# Patient Record
Sex: Male | Born: 1960 | Race: Black or African American | Hispanic: No | Marital: Single | Smoking: Never smoker
Health system: Southern US, Community
[De-identification: ages and names within clinical notes are randomized; demographics above are authoritative.]

## PROBLEM LIST (undated history)

## (undated) DIAGNOSIS — J45909 Unspecified asthma, uncomplicated: Secondary | ICD-10-CM

## (undated) DIAGNOSIS — N189 Chronic kidney disease, unspecified: Secondary | ICD-10-CM

## (undated) DIAGNOSIS — M1990 Unspecified osteoarthritis, unspecified site: Secondary | ICD-10-CM

## (undated) DIAGNOSIS — T8859XA Other complications of anesthesia, initial encounter: Secondary | ICD-10-CM

## (undated) DIAGNOSIS — I10 Essential (primary) hypertension: Secondary | ICD-10-CM

## (undated) HISTORY — PX: APPENDECTOMY: SHX54

## (undated) HISTORY — DX: Unspecified osteoarthritis, unspecified site: M19.90

---

## 2003-09-02 ENCOUNTER — Emergency Department (HOSPITAL_COMMUNITY): Admission: EM | Admit: 2003-09-02 | Discharge: 2003-09-02 | Payer: Self-pay | Admitting: Emergency Medicine

## 2004-05-10 ENCOUNTER — Ambulatory Visit: Payer: Self-pay | Admitting: Family Medicine

## 2004-06-10 ENCOUNTER — Ambulatory Visit: Payer: Self-pay | Admitting: Orthopedic Surgery

## 2005-05-05 ENCOUNTER — Ambulatory Visit: Payer: Self-pay | Admitting: Cardiology

## 2006-08-11 ENCOUNTER — Ambulatory Visit: Payer: Self-pay | Admitting: Cardiology

## 2009-05-07 ENCOUNTER — Encounter: Payer: Self-pay | Admitting: Family Medicine

## 2009-12-17 ENCOUNTER — Inpatient Hospital Stay (HOSPITAL_COMMUNITY): Admission: EM | Admit: 2009-12-17 | Discharge: 2009-12-20 | Payer: Self-pay | Admitting: Emergency Medicine

## 2010-09-19 LAB — DIFFERENTIAL
Basophils Absolute: 0 10*3/uL (ref 0.0–0.1)
Basophils Relative: 1 % (ref 0–1)
Basophils Relative: 1 % (ref 0–1)
Eosinophils Absolute: 0.1 10*3/uL (ref 0.0–0.7)
Eosinophils Absolute: 0.3 10*3/uL (ref 0.0–0.7)
Eosinophils Relative: 1 % (ref 0–5)
Eosinophils Relative: 4 % (ref 0–5)
Lymphs Abs: 2.5 10*3/uL (ref 0.7–4.0)
Monocytes Absolute: 0.6 10*3/uL (ref 0.1–1.0)
Monocytes Absolute: 0.9 10*3/uL (ref 0.1–1.0)
Monocytes Relative: 7 % (ref 3–12)
Monocytes Relative: 8 % (ref 3–12)
Monocytes Relative: 8 % (ref 3–12)
Neutro Abs: 8.1 10*3/uL — ABNORMAL HIGH (ref 1.7–7.7)
Neutrophils Relative %: 58 % (ref 43–77)

## 2010-09-19 LAB — BASIC METABOLIC PANEL
Calcium: 9.2 mg/dL (ref 8.4–10.5)
Creatinine, Ser: 1.16 mg/dL (ref 0.4–1.5)
GFR calc non Af Amer: >60 mL/min (ref >60)
Potassium: 4 mEq/L (ref 3.5–5.1)
Sodium: 136 mEq/L (ref 135–145)

## 2010-09-19 LAB — CARDIAC PANEL(CRET KIN+CKTOT+MB+TROPI)
CK, MB: 1.8 ng/mL (ref 0.3–4.0)
CK, MB: 2.4 ng/mL (ref 0.3–4.0)
Relative Index: 0.4 (ref 0.0–2.5)
Total CK: 402 U/L — ABNORMAL HIGH (ref 7–232)
Total CK: 584 U/L — ABNORMAL HIGH (ref 7–232)
Troponin I: 0.01 ng/mL (ref 0.00–0.06)
Troponin I: 0.01 ng/mL (ref 0.00–0.06)

## 2010-09-19 LAB — RAPID URINE DRUG SCREEN, HOSP PERFORMED
Amphetamines: NOT DETECTED
Barbiturates: NOT DETECTED
Tetrahydrocannabinol: POSITIVE — AB

## 2010-09-19 LAB — CBC
HCT: 43.6 % (ref 39.0–52.0)
Hemoglobin: 12.9 g/dL — ABNORMAL LOW (ref 13.0–17.0)
Hemoglobin: 14.6 g/dL (ref 13.0–17.0)
MCHC: 33.6 g/dL (ref 30.0–36.0)
MCHC: 33.7 g/dL (ref 30.0–36.0)
MCV: 94.2 fL (ref 78.0–100.0)
MCV: 94.8 fL (ref 78.0–100.0)
Platelets: 419 10*3/uL — ABNORMAL HIGH (ref 150–400)
RBC: 4.02 MIL/uL — ABNORMAL LOW (ref 4.22–5.81)
RBC: 4.22 MIL/uL (ref 4.22–5.81)
RDW: 13.3 % (ref 11.5–15.5)
RDW: 13.5 % (ref 11.5–15.5)
RDW: 13.8 % (ref 11.5–15.5)
WBC: 11.3 10*3/uL — ABNORMAL HIGH (ref 4.0–10.5)

## 2010-09-19 LAB — COMPREHENSIVE METABOLIC PANEL
ALT: 28 U/L (ref 0–53)
AST: 26 U/L (ref 0–37)
AST: 32 U/L (ref 0–37)
Albumin: 4.2 g/dL (ref 3.5–5.2)
Alkaline Phosphatase: 72 U/L (ref 39–117)
Alkaline Phosphatase: 82 U/L (ref 39–117)
BUN: 8 mg/dL (ref 6–23)
BUN: 9 mg/dL (ref 6–23)
CO2: 26 mEq/L (ref 19–32)
CO2: 28 mEq/L (ref 19–32)
Calcium: 9 mg/dL (ref 8.4–10.5)
Calcium: 9.6 mg/dL (ref 8.4–10.5)
Calcium: 9.6 mg/dL (ref 8.4–10.5)
Chloride: 102 mEq/L (ref 96–112)
Chloride: 103 mEq/L (ref 96–112)
Creatinine, Ser: 1.28 mg/dL (ref 0.4–1.5)
Creatinine, Ser: 1.32 mg/dL (ref 0.4–1.5)
GFR calc non Af Amer: >60 mL/min (ref >60)
Glucose, Bld: 110 mg/dL — ABNORMAL HIGH (ref 70–99)
Potassium: 3.7 mEq/L (ref 3.5–5.1)
Sodium: 138 mEq/L (ref 135–145)
Total Bilirubin: 0.5 mg/dL (ref 0.3–1.2)
Total Bilirubin: 0.6 mg/dL (ref 0.3–1.2)
Total Bilirubin: 0.7 mg/dL (ref 0.3–1.2)
Total Protein: 6.9 g/dL (ref 6.0–8.3)
Total Protein: 7.5 g/dL (ref 6.0–8.3)
Total Protein: 8.4 g/dL — ABNORMAL HIGH (ref 6.0–8.3)

## 2010-09-19 LAB — URINALYSIS, ROUTINE W REFLEX MICROSCOPIC
Hgb urine dipstick: NEGATIVE
Ketones, ur: NEGATIVE mg/dL
Nitrite: NEGATIVE
pH: 7 (ref 5.0–8.0)

## 2010-09-19 LAB — BRAIN NATRIURETIC PEPTIDE: Pro B Natriuretic peptide (BNP): <30 pg/mL (ref 0.0–100.0)

## 2010-09-19 LAB — LIPID PANEL
HDL: 33 mg/dL — ABNORMAL LOW (ref >39)
LDL Cholesterol: 115 mg/dL — ABNORMAL HIGH (ref 0–99)
Total CHOL/HDL Ratio: 5.6 RATIO
VLDL: 38 mg/dL (ref 0–40)

## 2010-09-19 LAB — LIPASE, BLOOD
Lipase: 159 U/L — ABNORMAL HIGH (ref 11–59)
Lipase: 198 U/L — ABNORMAL HIGH (ref 11–59)

## 2011-10-02 ENCOUNTER — Encounter (HOSPITAL_COMMUNITY): Payer: Self-pay | Admitting: Emergency Medicine

## 2011-10-02 ENCOUNTER — Emergency Department (HOSPITAL_COMMUNITY)
Admission: EM | Admit: 2011-10-02 | Discharge: 2011-10-02 | Disposition: A | Discharge status: Home / Self Care | Payer: Self-pay | Attending: Emergency Medicine | Admitting: Emergency Medicine

## 2011-10-02 ENCOUNTER — Emergency Department (HOSPITAL_COMMUNITY): Payer: Self-pay

## 2011-10-02 DIAGNOSIS — R220 Localized swelling, mass and lump, head: Secondary | ICD-10-CM | POA: Insufficient documentation

## 2011-10-02 DIAGNOSIS — Z79899 Other long term (current) drug therapy: Secondary | ICD-10-CM | POA: Insufficient documentation

## 2011-10-02 DIAGNOSIS — T7840XA Allergy, unspecified, initial encounter: Secondary | ICD-10-CM | POA: Insufficient documentation

## 2011-10-02 DIAGNOSIS — H5789 Other specified disorders of eye and adnexa: Secondary | ICD-10-CM | POA: Insufficient documentation

## 2011-10-02 DIAGNOSIS — L509 Urticaria, unspecified: Secondary | ICD-10-CM | POA: Insufficient documentation

## 2011-10-02 DIAGNOSIS — R21 Rash and other nonspecific skin eruption: Secondary | ICD-10-CM | POA: Insufficient documentation

## 2011-10-02 DIAGNOSIS — I10 Essential (primary) hypertension: Secondary | ICD-10-CM | POA: Insufficient documentation

## 2011-10-02 HISTORY — DX: Essential (primary) hypertension: I10

## 2011-10-02 LAB — DIFFERENTIAL
Basophils Relative: 1 % (ref 0–1)
Lymphocytes Relative: 31 % (ref 12–46)
Lymphs Abs: 3 10*3/uL (ref 0.7–4.0)
Monocytes Relative: 8 % (ref 3–12)
Neutro Abs: 4.9 10*3/uL (ref 1.7–7.7)
Neutrophils Relative %: 50 % (ref 43–77)

## 2011-10-02 LAB — BASIC METABOLIC PANEL
BUN: 10 mg/dL (ref 6–23)
CO2: 29 mEq/L (ref 19–32)
Chloride: 101 mEq/L (ref 96–112)
GFR calc Af Amer: 75 mL/min — ABNORMAL LOW (ref >90)
Glucose, Bld: 109 mg/dL — ABNORMAL HIGH (ref 70–99)
Potassium: 2.9 mEq/L — ABNORMAL LOW (ref 3.5–5.1)

## 2011-10-02 MED ORDER — SODIUM CHLORIDE 0.9 % IV SOLN: INTRAVENOUS | Status: DC

## 2011-10-02 MED ORDER — SODIUM CHLORIDE 0.9 % IV BOLUS (SEPSIS)
250.0000 mL | Freq: Once | INTRAVENOUS | Status: AC
  Administered 2011-10-02: 250 mL via INTRAVENOUS

## 2011-10-02 MED ORDER — METHYLPREDNISOLONE SODIUM SUCC 125 MG IJ SOLR
INTRAMUSCULAR | Status: AC
  Filled 2011-10-02: qty 2

## 2011-10-02 MED ORDER — FAMOTIDINE IN NACL 20-0.9 MG/50ML-% IV SOLN
INTRAVENOUS | Status: AC
  Filled 2011-10-02: qty 50

## 2011-10-02 MED ORDER — PREDNISONE 10 MG PO TABS: 40.0000 mg | ORAL_TABLET | Freq: Every day | ORAL | Status: DC

## 2011-10-02 MED ORDER — DIPHENHYDRAMINE HCL 50 MG/ML IJ SOLN
25.0000 mg | Freq: Once | INTRAMUSCULAR | Status: AC
  Administered 2011-10-02: 25 mg via INTRAVENOUS

## 2011-10-02 MED ORDER — DIPHENHYDRAMINE HCL 25 MG PO TABS: 25.0000 mg | ORAL_TABLET | Freq: Four times a day (QID) | ORAL | Status: DC

## 2011-10-02 MED ORDER — FAMOTIDINE 20 MG PO TABS: 20.0000 mg | ORAL_TABLET | Freq: Two times a day (BID) | ORAL | Status: DC

## 2011-10-02 MED ORDER — IOHEXOL 300 MG/ML  SOLN
80.0000 mL | Freq: Once | INTRAMUSCULAR | Status: AC | PRN
  Administered 2011-10-02: 80 mL via INTRAVENOUS

## 2011-10-02 MED ORDER — SODIUM CHLORIDE 0.9 % IV SOLN
Freq: Once | INTRAVENOUS | Status: AC
  Administered 2011-10-02: 07:00:00 via INTRAVENOUS

## 2011-10-02 MED ORDER — EPINEPHRINE HCL 1 MG/ML IJ SOLN
INTRAMUSCULAR | Status: AC
  Filled 2011-10-02: qty 1

## 2011-10-02 MED ORDER — AMOXICILLIN-POT CLAVULANATE 875-125 MG PO TABS: 1.0000 | ORAL_TABLET | Freq: Two times a day (BID) | ORAL | Status: AC

## 2011-10-02 MED ORDER — DIPHENHYDRAMINE HCL 50 MG/ML IJ SOLN
INTRAMUSCULAR | Status: AC
  Filled 2011-10-02: qty 1

## 2011-10-02 MED ORDER — FAMOTIDINE IN NACL 20-0.9 MG/50ML-% IV SOLN
20.0000 mg | Freq: Once | INTRAVENOUS | Status: AC
  Administered 2011-10-02: 20 mg via INTRAVENOUS

## 2011-10-02 MED ORDER — METHYLPREDNISOLONE SODIUM SUCC 125 MG IJ SOLR
125.0000 mg | Freq: Once | INTRAMUSCULAR | Status: AC
  Administered 2011-10-02: 125 mg via INTRAVENOUS

## 2011-10-02 MED ORDER — ONDANSETRON HCL 4 MG/2ML IJ SOLN
4.0000 mg | Freq: Once | INTRAMUSCULAR | Status: AC
  Administered 2011-10-02: 4 mg via INTRAVENOUS
  Filled 2011-10-02: qty 2

## 2011-10-02 MED ORDER — EPINEPHRINE HCL 0.1 MG/ML IJ SOLN
0.3000 mg | Freq: Once | INTRAMUSCULAR | Status: AC
  Administered 2011-10-02: 0.3 mg via INTRAVENOUS
  Filled 2011-10-02: qty 10

## 2011-10-02 NOTE — ED Provider Notes (Signed)
History     CSN: [id number]  Arrival date & time 10/02/11  [id number]   First MD Initiated Contact with Patient 10/02/11 [phone]      Chief Complaint  Patient presents with  . Allergic Reaction    (Consider location/radiation/quality/duration/timing/severity/associated sxs/prior treatment) Patient is a 51 y.o. male presenting with allergic reaction. The history is provided by the patient.  Allergic Reaction  He woke up about 15 minutes ago and noticed that his face was swollen and he had a rash on his chest. He denies itching or difficulty breathing but he does feel like his throat is starting to swell. There's been no fever, chills, sweats. There's been no nausea or vomiting or diarrhea. He he is taking verapamil for blood pressure but is on no other medications. He denies any new exposures. He has never had reactions like this before. Symptoms are moderate to severe. Nothing makes it better nothing makes it worse.  Past Medical History  Diagnosis Date  . Hypertension     History reviewed. No pertinent past surgical history.  History reviewed. No pertinent family history.  History  Substance Use Topics  . Smoking status: Not on file  . Smokeless tobacco: Not on file  . Alcohol Use:       Review of Systems  All other systems reviewed and are negative.    Allergies  Shellfish allergy  Home Medications   Current Outpatient Rx  Name Route Sig Dispense Refill  . VERAPAMIL HCL 40 MG PO TABS Oral Take by mouth 3 (three) times daily.      Ht 6\' 1"  (1.854 m)  Wt 225 lb (102.059 kg)  BMI 29.69 kg/m2  Physical Exam  Nursing note and vitals reviewed.  51 year old male who is resting comfortably and in no acute distress. Vital signs are normal. Head is normocephalic and atraumatic. PERRLA, EOMI. Moderate  swelling is noted of the eyelids and upper lip. There is no swelling of the tongue, sublingual tissue, or pharynx. He has no difficulty tolerating his secretions and his  phonation is normal. Some tense swelling in the submandibular area. Neck is nontender supple without stridor. Lungs are clear without rales, wheezes, or rhonchi. Heart has regular rate rhythm without murmur. Abdomen is soft, flat, nontender without masses or hepatosplenomegaly. Extremities have no cyanosis or edema full range of motion is present. Skin: Erythematous 2 urticarial rash is present over the upper trunk. Neurologic: Mental status is normal, cranial nerves are intact, there no focal motor or sensory deficits.  ED Course  Procedures (including critical care time)  Results for orders placed during the hospital encounter of 10/02/11  CBC      Component Value Range   WBC 9.6  4.0 - 10.5 (K/uL)   RBC 4.53  4.22 - 5.81 (MIL/uL)   Hemoglobin 14.0  13.0 - 17.0 (g/dL)   HCT 41.0  39.0 - 52.0 (%)   MCV 90.5  78.0 - 100.0 (fL)   MCH 30.9  26.0 - 34.0 (pg)   MCHC 34.1  30.0 - 36.0 (g/dL)   RDW 12.9  11.5 - 15.5 (%)   Platelets 467 (*) 150 - 400 (K/uL)  DIFFERENTIAL      Component Value Range   Neutrophils Relative 50  43 - 77 (%)   Neutro Abs 4.9  1.7 - 7.7 (K/uL)   Lymphocytes Relative 31  12 - 46 (%)   Lymphs Abs 3.0  0.7 - 4.0 (K/uL)   Monocytes Relative 8  3 - 12 (%)  Monocytes Absolute 0.8  0.1 - 1.0 (K/uL)   Eosinophils Relative 9 (*) 0 - 5 (%)   Eosinophils Absolute 0.9 (*) 0.0 - 0.7 (K/uL)   Basophils Relative 1  0 - 1 (%)   Basophils Absolute 0.1  0.0 - 0.1 (K/uL)  BASIC METABOLIC PANEL      Component Value Range   Sodium 141  135 - 145 (mEq/L)   Potassium 2.9 (*) 3.5 - 5.1 (mEq/L)   Chloride 101  96 - 112 (mEq/L)   CO2 29  19 - 32 (mEq/L)   Glucose, Bld 109 (*) 70 - 99 (mg/dL)   BUN 10  6 - 23 (mg/dL)   Creatinine, Ser 1.28  0.50 - 1.35 (mg/dL)   Calcium 10.0  8.4 - 10.5 (mg/dL)   GFR calc non Af Amer 65 (*) >90 (mL/min)   GFR calc Af Amer 75 (*) >90 (mL/min)   After Benadryl, Pepcid, and Solu-Medrol he did not see any improvement and stated he was a little worse  and that he was itching more. He will be given additional Benadryl and will be given epinephrine. On reexam, swelling looked to be about the same and he continued to have normal voice and no difficulty with secretions. Care will be turned over to Dr. Bobby Rumpf.  1. Allergic reaction     CRITICAL CARE Performed by: [id number]   Total critical care time: 45 minutes  Critical care time was exclusive of separately billable procedures and treating other patients.  Critical care was necessary to treat or prevent imminent or life-threatening deterioration.  Critical care was time spent personally by me on the following activities: development of treatment plan with patient and/or surrogate as well as nursing, discussions with consultants, evaluation of patient's response to treatment, examination of patient, obtaining history from patient or surrogate, ordering and performing treatments and interventions, ordering and review of laboratory studies, ordering and review of radiographic studies, pulse oximetry and re-evaluation of patient's condition.   MDM  Acute allergic reaction although it is unclear at this point what he might be reacting to. He is given Benadryl, Pepcid, & Medrol and will be observed.        Delora Fuel, MD [id number]

## 2011-10-02 NOTE — ED Provider Notes (Addendum)
Patient turned over to me by Dr. Roxanne Mins. Felt to have an allergic reaction with facial swelling mostly underneath the chin in the submental area patient said his tongue was itchy but not really swollen treated with epinephrine Solu-Medrol Pepcid Benadryl now been a couple hours and patient states that the tongue itching is gone away but the submental swelling is still present really denies any pain or tooth pain ever when you palpate the submental areas. Firm and somewhat tender. Will proceed with the CT scan soft tissue neck with contrast to rule out an abscess or thyo glossal duct cyst. Not convinced that this is a true allergic reaction we'll see what CT scan results show.   Results for orders placed during the hospital encounter of 10/02/11  CBC      Component Value Range   WBC 9.6  4.0 - 10.5 (K/uL)   RBC 4.53  4.22 - 5.81 (MIL/uL)   Hemoglobin 14.0  13.0 - 17.0 (g/dL)   HCT 41.0  39.0 - 52.0 (%)   MCV 90.5  78.0 - 100.0 (fL)   MCH 30.9  26.0 - 34.0 (pg)   MCHC 34.1  30.0 - 36.0 (g/dL)   RDW 12.9  11.5 - 15.5 (%)   Platelets 467 (*) 150 - 400 (K/uL)  DIFFERENTIAL      Component Value Range   Neutrophils Relative 50  43 - 77 (%)   Neutro Abs 4.9  1.7 - 7.7 (K/uL)   Lymphocytes Relative 31  12 - 46 (%)   Lymphs Abs 3.0  0.7 - 4.0 (K/uL)   Monocytes Relative 8  3 - 12 (%)   Monocytes Absolute 0.8  0.1 - 1.0 (K/uL)   Eosinophils Relative 9 (*) 0 - 5 (%)   Eosinophils Absolute 0.9 (*) 0.0 - 0.7 (K/uL)   Basophils Relative 1  0 - 1 (%)   Basophils Absolute 0.1  0.0 - 0.1 (K/uL)  BASIC METABOLIC PANEL      Component Value Range   Sodium 141  135 - 145 (mEq/L)   Potassium 2.9 (*) 3.5 - 5.1 (mEq/L)   Chloride 101  96 - 112 (mEq/L)   CO2 29  19 - 32 (mEq/L)   Glucose, Bld 109 (*) 70 - 99 (mg/dL)   BUN 10  6 - 23 (mg/dL)   Creatinine, Ser 1.28  0.50 - 1.35 (mg/dL)   Calcium 10.0  8.4 - 10.5 (mg/dL)   GFR calc non Af Amer 65 (*) >90 (mL/min)   GFR calc Af Amer 75 (*) >90 (mL/min)   Ct  Soft Tissue Neck W Contrast  10/02/2011  *RADIOLOGY REPORT*  Clinical Data: 51 year old male low with face and neck swelling. Difficulty breathing.  CT NECK WITH CONTRAST  Technique:  Multidetector CT imaging of the neck was performed with intravenous contrast.  Contrast: 54mL OMNIPAQUE IOHEXOL 300 MG/ML IJ SOLN following 1 hour protocol steroid premedication for  stated IV contrast allergy.  No allergic reactions noted during the patient's time and radiology.  Comparison: None.  Findings: Generalized subcutaneous fat stranding, thickening of the platysma, and deep neck fat stranding bilaterally from the level of the mandible inferiorly.  Enlarged level IA lymph nodes up to 13 mm in short axis appear to be reactive.  Right greater than left level II nodes measure up to 13 mm.  Both submandibular glands are surrounded by the inflammatory changes, but show no hyperenhancement or significant heterogeneity.  The inflammation extends to both carotid spaces, but does not  involve the parapharyngeal or sublingual spaces.  The retropharyngeal space also does not appear significantly affected.  Symmetric appearing hypertrophy of the tonsillar pillars.  There is mild inflammation affecting the vallecula and hypopharynx, but the epiglottis is not thickened.  There is motion artifact at the larynx which appears within normal limits.  The inflammation does not extend into the mediastinum.  Negative thyroid.  Major vascular structures including both internal jugular veins and the visualized innominate vein and superior SVC are patent.  Visualized orbit soft tissues are within normal limits.  Negative visualized brain parenchyma.  Mild bubbly opacity in the left maxillary sinus.  Mild right maxillary sinus mucosal thickening. Other Visualized paranasal sinuses and mastoids are clear.  No acute dental findings identified. No acute osseous abnormality identified.  Chronic cervical disc and endplate degeneration.  IMPRESSION: 1.   Generalized inflammatory change in the anterior neck extending from the level of the mandible to the sternoclavicular level.  Fat stranding and reactive type level I and level II lymphadenopathy with no abscess or apparent infectious epicenter. 2.  There is mild inflammatory involvement of the hypopharynx, but the epiglottis and glottis appear relatively spared.  No significant retropharyngeal involvement.  No sublingual space or parapharyngeal involvement. 3.  No mediastinal involvement.  Visualized major vascular structures are patent.  4.  The patient received IV contrast following 1 hour steroid premedication regimen with no apparent reaction.  Original Report Authenticated By: Randall An, M.D.   CT scan results noted still not clear if soft tissue swelling is allergic related with the lymph node involvements more suggestive of non-allergic reaction but no distinct evidence of abscess. Patient is going to require followup with ear nose and throat will provide referral will treat with prednisone Benadryl and Pepcid in the short run and add an antibiotic. Close followup with ENT will be important. Patient will be given instructions to return for any new or worse symptoms.  Patient tolerated the CT scan with dye fine no additional allergic reaction. Patient's airway is patent according to the scan no immediate concerns there.   Mervin Kung, MD 10/02/11 [place], MD 10/02/11 [phone]

## 2011-10-02 NOTE — ED Notes (Signed)
Patient states he woke up with facial swelling, rash on upper torso and back, and feels like his throat is swelling up. Denies eating anything he's allergic to.

## 2011-10-02 NOTE — Discharge Instructions (Signed)
Allergic Reaction Allergic reactions can be caused by anything your body is sensitive to. Your body may be sensitive to food, medicines, molds, pollens, cockroaches, dust mites, pets, insect stings, and other things around you. An allergic reaction may cause puffiness (swelling), itching, sneezing, coughing, or problems breathing.  Allergies cannot be cured, but they can be controlled with medicine. Some allergies happen only at certain times of the year. Try to stay away from what causes your reaction if possible. Sometimes, it is hard to tell what causes your reaction. HOME CARE If you have a rash or red patches (hives) on your skin:  Take medicines as told by your doctor.   Do not drive or drink alcohol after taking medicines. They can make you sleepy.   Put cold cloths on your skin. Take baths in cool water. This will help your itching. Do not take hot baths or showers. Heat will make the itching worse.   If your allergies get worse, your doctor might give you other medicines. Talk to your doctor if problems continue.  GET HELP RIGHT AWAY IF:   You have trouble breathing.   You have a tight feeling in your chest or throat.   Your mouth gets puffy (swollen).   You have red, itchy patches on your skin (hives) that get worse.   You have itching all over your body.  MAKE SURE YOU:   Understand these instructions.   Will watch your condition.   Will get help right away if you are not doing well or get worse.  Document Released: 06/08/2009 Document Revised: 06/09/2011 Document Reviewed: 06/08/2009 [facility] Patient Information 2012 [place].  Important to followup with ear nose and throat CT scan shows a significant amount of swelling not clear if it's an allergic reaction also enlarged lymph nodes if do not improve we'll require biopsy. Take prednisone Benadryl and Pepcid as directed. Also take Augmentin the antibiotic as directed to see if it will improve and a developing  infection. Important to return for any new or worse symptoms. Very important to followup with ear nose and throat.

## 2011-11-22 DIAGNOSIS — IMO0002 Reserved for concepts with insufficient information to code with codable children: Secondary | ICD-10-CM | POA: Insufficient documentation

## 2011-11-22 DIAGNOSIS — R079 Chest pain, unspecified: Secondary | ICD-10-CM | POA: Insufficient documentation

## 2011-11-22 DIAGNOSIS — R0602 Shortness of breath: Secondary | ICD-10-CM | POA: Insufficient documentation

## 2011-11-22 DIAGNOSIS — Z79899 Other long term (current) drug therapy: Secondary | ICD-10-CM | POA: Insufficient documentation

## 2011-11-22 DIAGNOSIS — I10 Essential (primary) hypertension: Secondary | ICD-10-CM | POA: Insufficient documentation

## 2011-11-22 DIAGNOSIS — E876 Hypokalemia: Secondary | ICD-10-CM | POA: Insufficient documentation

## 2011-11-22 DIAGNOSIS — F17200 Nicotine dependence, unspecified, uncomplicated: Secondary | ICD-10-CM | POA: Insufficient documentation

## 2011-11-22 NOTE — ED Notes (Signed)
Patient c/o left sided chest pain that radiates into left side of back.

## 2011-11-23 ENCOUNTER — Emergency Department (HOSPITAL_COMMUNITY)
Admission: EM | Admit: 2011-11-23 | Discharge: 2011-11-23 | Disposition: A | Discharge status: Home / Self Care | Payer: Self-pay | Attending: Emergency Medicine | Admitting: Emergency Medicine

## 2011-11-23 ENCOUNTER — Emergency Department (HOSPITAL_COMMUNITY): Payer: Self-pay

## 2011-11-23 ENCOUNTER — Encounter (HOSPITAL_COMMUNITY): Payer: Self-pay | Admitting: Emergency Medicine

## 2011-11-23 DIAGNOSIS — E876 Hypokalemia: Secondary | ICD-10-CM

## 2011-11-23 DIAGNOSIS — R200 Anesthesia of skin: Secondary | ICD-10-CM

## 2011-11-23 DIAGNOSIS — R202 Paresthesia of skin: Secondary | ICD-10-CM

## 2011-11-23 LAB — BASIC METABOLIC PANEL
BUN: 21 mg/dL (ref 6–23)
Chloride: 103 mEq/L (ref 96–112)
GFR calc non Af Amer: 51 mL/min — ABNORMAL LOW (ref >90)
Glucose, Bld: 94 mg/dL (ref 70–99)
Potassium: 3.1 mEq/L — ABNORMAL LOW (ref 3.5–5.1)

## 2011-11-23 LAB — CBC
HCT: 37 % — ABNORMAL LOW (ref 39.0–52.0)
Hemoglobin: 12.7 g/dL — ABNORMAL LOW (ref 13.0–17.0)
MCHC: 34.3 g/dL (ref 30.0–36.0)

## 2011-11-23 LAB — URINALYSIS, ROUTINE W REFLEX MICROSCOPIC
Bilirubin Urine: NEGATIVE
Glucose, UA: NEGATIVE mg/dL
Ketones, ur: 15 mg/dL — AB
Protein, ur: NEGATIVE mg/dL
pH: 6 (ref 5.0–8.0)

## 2011-11-23 MED ORDER — ONDANSETRON HCL 4 MG/2ML IJ SOLN
4.0000 mg | Freq: Once | INTRAMUSCULAR | Status: AC
  Administered 2011-11-23: 4 mg via INTRAVENOUS
  Filled 2011-11-23: qty 2

## 2011-11-23 MED ORDER — MORPHINE SULFATE 4 MG/ML IJ SOLN
2.0000 mg | Freq: Once | INTRAMUSCULAR | Status: AC
  Administered 2011-11-23: 2 mg via INTRAVENOUS
  Filled 2011-11-23: qty 1

## 2011-11-23 MED ORDER — SODIUM CHLORIDE 0.9 % IV SOLN
Freq: Once | INTRAVENOUS | Status: AC
  Administered 2011-11-23: 01:00:00 via INTRAVENOUS

## 2011-11-23 MED ORDER — POTASSIUM CHLORIDE CRYS ER 20 MEQ PO TBCR
60.0000 meq | EXTENDED_RELEASE_TABLET | Freq: Once | ORAL | Status: AC
  Administered 2011-11-23: 60 meq via ORAL
  Filled 2011-11-23: qty 3

## 2011-11-23 NOTE — ED Notes (Signed)
Pt reports mild relief from pain and nausea.  Pt has tolerated 3 glasses of water and 2 cans of gingerale with no vomiting.

## 2011-11-23 NOTE — ED Provider Notes (Cosign Needed)
History     CSN: [id number]  Arrival date & time 11/22/11  2346   First MD Initiated Contact with Patient 11/23/11 0011      Chief Complaint  Patient presents with  . Chest Pain    (Consider location/radiation/quality/duration/timing/severity/associated sxs/prior treatment) HPI Ethan Schmidt is a 51 y.o. male who presents to the Emergency Department complaining of multiple complaints including tingling and numbness to the fingers on his right hand, chest discomfort to the left chest that radiates around to his back, incidental shortness of breath, and a feeling of fatigue. On symptoms began at 8:00 this evening. He is taking no medications. There is nothing that seems to make any of his symptoms better, nothing makes them worse.  No PCP  Past Medical History  Diagnosis Date  . Hypertension     History reviewed. No pertinent past surgical history.  No family history on file.  History  Substance Use Topics  . Smoking status: Current Everyday Smoker  . Smokeless tobacco: Not on file  . Alcohol Use: Yes     occ      Review of Systems  Constitutional: Negative for fever.       10 Systems reviewed and are negative for acute change except as noted in the HPI.  HENT: Negative for congestion.   Eyes: Negative for discharge and redness.  Respiratory: Positive for shortness of breath. Negative for cough.   Cardiovascular: Positive for chest pain.  Gastrointestinal: Negative for vomiting and abdominal pain.  Musculoskeletal: Negative for back pain.  Skin: Negative for rash.  Neurological: Positive for numbness. Negative for syncope and headaches.  Psychiatric/Behavioral:       No behavior change.    Allergies  Omnipaque and Shellfish allergy  Home Medications   Current Outpatient Rx  Name Route Sig Dispense Refill  . DIPHENHYDRAMINE HCL 25 MG PO TABS Oral Take 1 tablet (25 mg total) by mouth every 6 (six) hours. 20 tablet 0  . FAMOTIDINE 20 MG PO TABS Oral Take 1  tablet (20 mg total) by mouth 2 (two) times daily. 20 tablet 0  . PREDNISONE 10 MG PO TABS Oral Take 4 tablets (40 mg total) by mouth daily. 10 tablet 0  . VERAPAMIL HCL 40 MG PO TABS Oral Take by mouth 2 (two) times daily.       Pulse 74  Temp(Src) 97.9 F (36.6 C) (Oral)  Resp 20  Ht 6\' 1"  (1.854 m)  Wt 225 lb (102.059 kg)  BMI 29.69 kg/m2  SpO2 94%  Physical Exam  Nursing note and vitals reviewed. Constitutional: He is oriented to person, place, and time.       Awake, alert, nontoxic appearance.  HENT:  Head: Atraumatic.  Eyes: Right eye exhibits no discharge. Left eye exhibits no discharge.  Neck: Neck supple.  Cardiovascular: Normal rate, normal heart sounds and intact distal pulses.   Pulmonary/Chest: Effort normal. He has no wheezes. He has no rales. He exhibits no tenderness.  Abdominal: Soft. There is no tenderness. There is no rebound.  Musculoskeletal: He exhibits no tenderness.       Baseline ROM, no obvious new focal weakness.No spinal tenderness to percussion. Mild left sided lumbar paraspinal muscle tenderness to palpation.   Neurological: He is alert and oriented to person, place, and time. He displays normal reflexes. No cranial nerve deficit. Coordination normal.       Mental status and motor strength appears baseline for patient and situation.  Skin: Skin is warm and dry.  No rash noted.  Psychiatric: He has a normal mood and affect.    ED Course  Procedures (including critical care time) Results for orders placed during the hospital encounter of 11/23/11  CBC      Component Value Range   WBC 7.4  4.0 - 10.5 (K/uL)   RBC 4.03 (*) 4.22 - 5.81 (MIL/uL)   Hemoglobin 12.7 (*) 13.0 - 17.0 (g/dL)   HCT 37.0 (*) 39.0 - 52.0 (%)   MCV 91.8  78.0 - 100.0 (fL)   MCH 31.5  26.0 - 34.0 (pg)   MCHC 34.3  30.0 - 36.0 (g/dL)   RDW 13.0  11.5 - 15.5 (%)   Platelets 396  150 - 400 (K/uL)  BASIC METABOLIC PANEL      Component Value Range   Sodium 141  135 - 145  (mEq/L)   Potassium 3.1 (*) 3.5 - 5.1 (mEq/L)   Chloride 103  96 - 112 (mEq/L)   CO2 26  19 - 32 (mEq/L)   Glucose, Bld 94  70 - 99 (mg/dL)   BUN 21  6 - 23 (mg/dL)   Creatinine, Ser 1.56 (*) 0.50 - 1.35 (mg/dL)   Calcium 9.7  8.4 - 10.5 (mg/dL)   GFR calc non Af Amer 51 (*) >90 (mL/min)   GFR calc Af Amer 59 (*) >90 (mL/min)  URINALYSIS, ROUTINE W REFLEX MICROSCOPIC      Component Value Range   Color, Urine YELLOW  YELLOW    APPearance CLEAR  CLEAR    Specific Gravity, Urine 1.025  1.005 - 1.030    pH 6.0  5.0 - 8.0    Glucose, UA NEGATIVE  NEGATIVE (mg/dL)   Hgb urine dipstick NEGATIVE  NEGATIVE    Bilirubin Urine NEGATIVE  NEGATIVE    Ketones, ur 15 (*) NEGATIVE (mg/dL)   Protein, ur NEGATIVE  NEGATIVE (mg/dL)   Urobilinogen, UA 0.2  0.0 - 1.0 (mg/dL)   Nitrite NEGATIVE  NEGATIVE    Leukocytes, UA NEGATIVE  NEGATIVE   POCT I-STAT TROPONIN I      Component Value Range   Troponin i, poc 0.00  0.00 - 0.08 (ng/mL)   Comment 3              Date: 11/23/2011  0004  Rate: 71  Rhythm: normal sinus rhythm  QRS Axis: normal  Intervals: normal  ST/T Wave abnormalities: normal  Conduction Disutrbances:none  Narrative Interpretation:   Old EKG Reviewed: none available Chest Portable 1 View  11/23/2011  *RADIOLOGY REPORT*  Clinical Data: Chest pain  PORTABLE CHEST - 1 VIEW  Comparison: 12/17/2009  Findings: Elevated right hemidiaphragm.  Mild linear retrocardiac opacity.  Heart size upper normal limits to mildly enlarged. Otherwise mediastinal contours within normal limits.  No additional areas of consolidation, pleural effusion, or pneumothorax.  No acute osseous finding.  IMPRESSION: Heart size upper normal limits to mildly enlarged.  Mild linear retrocardiac opacity is likely scarring or atelectasis.  Original Report Authenticated By: Suanne Marker, M.D.     MDM  Patient with multiple complaints including left chest pain with radiation to his back. Given IVF, analgesic,  antiemetic with relief of discomfort. Has had PO fluids and a snack. Potassium was slightly low and repleted. Dx testing d/w pt.  Questions answered.  Verb understanding, agreeable to d/c home with outpt f/u.Pt stable in ED with no significant deterioration in condition.The patient appears reasonably screened and/or stabilized for discharge and I doubt any other medical condition or  other [place] requiring further screening, evaluation, or treatment in the ED at this time prior to discharge.  MDM Reviewed: nursing note and vitals Interpretation: labs, ECG and x-ray            Gypsy Balsam. Olin Hauser, MD 11/23/11 [phone]

## 2011-11-23 NOTE — Discharge Instructions (Signed)
Your blood work was normal here tonight except for a slightly low potassium. We gave you potassium while you were here. Your EKG, hart number and chest xray were all normal. You may use tylenol or ibuprofen for discomfort.

## 2013-09-04 ENCOUNTER — Emergency Department (HOSPITAL_COMMUNITY)
Admission: EM | Admit: 2013-09-04 | Discharge: 2013-09-04 | Disposition: A | Discharge status: Home / Self Care | Payer: Medicaid - Out of State | Attending: Emergency Medicine | Admitting: Emergency Medicine

## 2013-09-04 ENCOUNTER — Encounter (HOSPITAL_COMMUNITY): Payer: Self-pay | Admitting: Emergency Medicine

## 2013-09-04 DIAGNOSIS — R42 Dizziness and giddiness: Secondary | ICD-10-CM | POA: Insufficient documentation

## 2013-09-04 DIAGNOSIS — R197 Diarrhea, unspecified: Secondary | ICD-10-CM | POA: Insufficient documentation

## 2013-09-04 DIAGNOSIS — R51 Headache: Secondary | ICD-10-CM | POA: Insufficient documentation

## 2013-09-04 DIAGNOSIS — F17200 Nicotine dependence, unspecified, uncomplicated: Secondary | ICD-10-CM | POA: Insufficient documentation

## 2013-09-04 DIAGNOSIS — R0602 Shortness of breath: Secondary | ICD-10-CM | POA: Insufficient documentation

## 2013-09-04 DIAGNOSIS — R1013 Epigastric pain: Secondary | ICD-10-CM | POA: Insufficient documentation

## 2013-09-04 DIAGNOSIS — R112 Nausea with vomiting, unspecified: Secondary | ICD-10-CM

## 2013-09-04 DIAGNOSIS — Z79899 Other long term (current) drug therapy: Secondary | ICD-10-CM | POA: Insufficient documentation

## 2013-09-04 DIAGNOSIS — I10 Essential (primary) hypertension: Secondary | ICD-10-CM | POA: Insufficient documentation

## 2013-09-04 MED ORDER — MORPHINE SULFATE 4 MG/ML IJ SOLN
4.0000 mg | Freq: Once | INTRAMUSCULAR | Status: AC
  Administered 2013-09-04: 4 mg via INTRAVENOUS
  Filled 2013-09-04: qty 1

## 2013-09-04 MED ORDER — ONDANSETRON HCL 4 MG/2ML IJ SOLN
4.0000 mg | Freq: Once | INTRAMUSCULAR | Status: AC
  Administered 2013-09-04: 4 mg via INTRAVENOUS
  Filled 2013-09-04: qty 2

## 2013-09-04 MED ORDER — ONDANSETRON 8 MG PO TBDP: 8.0000 mg | ORAL_TABLET | Freq: Three times a day (TID) | ORAL | Status: DC | PRN

## 2013-09-04 MED ORDER — SODIUM CHLORIDE 0.9 % IV BOLUS (SEPSIS)
1000.0000 mL | Freq: Once | INTRAVENOUS | Status: AC
  Administered 2013-09-04: 1000 mL via INTRAVENOUS

## 2013-09-04 MED ORDER — PROMETHAZINE HCL 25 MG PO TABS: 25.0000 mg | ORAL_TABLET | Freq: Four times a day (QID) | ORAL | Status: DC | PRN

## 2013-09-04 MED ORDER — SODIUM CHLORIDE 0.9 % IV BOLUS (SEPSIS)
500.0000 mL | Freq: Once | INTRAVENOUS | Status: AC
  Administered 2013-09-04: 1000 mL via INTRAVENOUS

## 2013-09-04 NOTE — ED Notes (Signed)
Pt tolerating po ginger ale well.

## 2013-09-04 NOTE — ED Provider Notes (Signed)
CSN: [id number]     Arrival date & time 09/04/13  1406 History  This chart was scribed for [employer]. Alvino Chapel, MD by Ludger Nutting, ED Scribe. This patient was seen in room APA09/APA09 and the patient's care was started 2:48 PM.    Chief Complaint  Patient presents with  . Emesis      The history is provided by the patient. No language interpreter was used.    HPI Comments: Ethan Schmidt is a 53 y.o. male who presents to the Emergency Department complaining of sudden onset of multiple episodes of nausea, vomiting, diarrhea for the past 2 hours. He also reports associated dizziness, HA, SOB. He reports waking up without any symptoms. He is unable to tolerate liquids or food. He denies sick contacts. He denies hematochezia, hematemesis, fever.    Past Medical History  Diagnosis Date  . Hypertension    History reviewed. No pertinent past surgical history. No family history on file. History  Substance Use Topics  . Smoking status: Current Every Day Smoker  . Smokeless tobacco: Not on file  . Alcohol Use: Yes     Comment: occ    Review of Systems  A complete 10 system review of systems was obtained and all systems are negative except as noted in the HPI and PMH.    Allergies  Omnipaque and Shellfish allergy  Home Medications   Current Outpatient Rx  Name  Route  Sig  Dispense  Refill  . lisinopril-hydrochlorothiazide (PRINZIDE,ZESTORETIC) 20-12.5 MG per tablet   Oral   Take 1 tablet by mouth daily.         . ondansetron (ZOFRAN-ODT) 8 MG disintegrating tablet   Oral   Take 1 tablet (8 mg total) by mouth every 8 (eight) hours as needed for nausea or vomiting.   10 tablet   0   . promethazine (PHENERGAN) 25 MG tablet   Oral   Take 1 tablet (25 mg total) by mouth every 6 (six) hours as needed for nausea.   20 tablet   0    BP 166/102  Pulse 79  Temp(Src) 98.6 F (37 C) (Oral)  Resp 20  Ht 6\' 2"  (1.88 m)  Wt 200 lb (90.719 kg)  BMI 25.67 kg/m2  SpO2  100% Physical Exam  Nursing note and vitals reviewed. Constitutional: He is oriented to person, place, and time. He appears well-developed and well-nourished.  Uncomfortable appearing.   HENT:  Head: Normocephalic and atraumatic.  Eyes: Conjunctivae and EOM are normal. Pupils are equal, round, and reactive to light.  Neck: Normal range of motion. Neck supple.  Cardiovascular: Normal rate, regular rhythm and normal heart sounds.   Pulmonary/Chest: Effort normal and breath sounds normal.  Abdominal: Soft. Bowel sounds are normal. He exhibits no distension and no mass. There is tenderness (mild epigatric). There is no rebound and no guarding.  Musculoskeletal: Normal range of motion.  Neurological: He is alert and oriented to person, place, and time.  Skin: Skin is warm and dry.  Psychiatric: He has a normal mood and affect. His behavior is normal.    ED Course  Procedures (including critical care time)  DIAGNOSTIC STUDIES: Oxygen Saturation is 98% on RA, normal by my interpretation.    COORDINATION OF CARE: 3:04 PM Discussed treatment plan with pt at bedside and pt agreed to plan.   Labs Review Labs Reviewed - No data to display Imaging Review No results found.   EKG Interpretation None      MDM  Final diagnoses:  Nausea vomiting and diarrhea    Patient nausea vomiting diarrhea. Feels better after IV fluids and medication. Has tolerated orals he'll be discharged home. Likely gastroenteritis, however patient will followup if symptoms and pain continue.  I personally performed the services described in this documentation, which was scribed in my presence. The recorded information has been reviewed and is accurate.    Jasper Riling. Alvino Chapel, MD 09/04/13 1740

## 2013-09-04 NOTE — Discharge Instructions (Signed)
Diarrhea °Diarrhea is frequent loose and watery bowel movements. It can cause you to feel weak and dehydrated. Dehydration can cause you to become tired and thirsty, have a dry mouth, and have decreased urination that often is dark yellow. Diarrhea is a sign of another problem, most often an infection that will not last long. In most cases, diarrhea typically lasts 2 3 days. However, it can last longer if it is a sign of something more serious. It is important to treat your diarrhea as directed by your caregive to lessen or prevent future episodes of diarrhea. °CAUSES  °Some common causes include: °· Gastrointestinal infections caused by viruses, bacteria, or parasites. °· Food poisoning or food allergies. °· Certain medicines, such as antibiotics, chemotherapy, and laxatives. °· Artificial sweeteners and fructose. °· Digestive disorders. °HOME CARE INSTRUCTIONS °· Ensure adequate fluid intake (hydration): have 1 cup (8 oz) of fluid for each diarrhea episode. Avoid fluids that contain simple sugars or sports drinks, fruit juices, whole milk products, and sodas. Your urine should be clear or pale yellow if you are drinking enough fluids. Hydrate with an oral rehydration solution that you can purchase at pharmacies, retail stores, and online. You can prepare an oral rehydration solution at home by mixing the following ingredients together: °·   tsp table salt. °· ¾ tsp baking soda. °·  tsp salt substitute containing potassium chloride. °· 1  tablespoons sugar. °· 1 L (34 oz) of water. °· Certain foods and beverages may increase the speed at which food moves through the gastrointestinal (GI) tract. These foods and beverages should be avoided and include: °· Caffeinated and alcoholic beverages. °· High-fiber foods, such as raw fruits and vegetables, nuts, seeds, and whole grain breads and cereals. °· Foods and beverages sweetened with sugar alcohols, such as xylitol, sorbitol, and mannitol. °· Some foods may be well  tolerated and may help thicken stool including: °· Starchy foods, such as rice, toast, pasta, low-sugar cereal, oatmeal, grits, baked potatoes, crackers, and bagels. °· Bananas. °· Applesauce. °· Add probiotic-rich foods to help increase healthy bacteria in the GI tract, such as yogurt and fermented milk products. °· Wash your hands well after each diarrhea episode. °· Only take over-the-counter or prescription medicines as directed by your caregiver. °· Take a warm bath to relieve any burning or pain from frequent diarrhea episodes. °SEEK IMMEDIATE MEDICAL CARE IF:  °· You are unable to keep fluids down. °· You have persistent vomiting. °· You have blood in your stool, or your stools are black and tarry. °· You do not urinate in 6 8 hours, or there is only a small amount of very dark urine. °· You have abdominal pain that increases or localizes. °· You have weakness, dizziness, confusion, or lightheadedness. °· You have a severe headache. °· Your diarrhea gets worse or does not get better. °· You have a fever or persistent symptoms for more than 2 3 days. °· You have a fever and your symptoms suddenly get worse. °MAKE SURE YOU:  °· Understand these instructions. °· Will watch your condition. °· Will get help right away if you are not doing well or get worse. °Document Released: 06/10/2002 Document Revised: 06/06/2012 Document Reviewed: 02/26/2012 °ExitCare® Patient Information ©2014 [employer]. ° °Nausea and Vomiting °Nausea is a sick feeling that often comes before throwing up (vomiting). Vomiting is a reflex where stomach contents come out of your mouth. Vomiting can cause severe loss of body fluids (dehydration). Children and elderly adults can become   dehydrated quickly, especially if they also have diarrhea. Nausea and vomiting are symptoms of a condition or disease. It is important to find the cause of your symptoms. °CAUSES  °· Direct irritation of the stomach lining. This irritation can result from  increased acid production (gastroesophageal reflux disease), infection, food poisoning, taking certain medicines (such as nonsteroidal anti-inflammatory drugs), alcohol use, or tobacco use. °· Signals from the brain. These signals could be caused by a headache, heat exposure, an inner ear disturbance, increased pressure in the brain from injury, infection, a tumor, or a concussion, pain, emotional stimulus, or metabolic problems. °· An obstruction in the gastrointestinal tract (bowel obstruction). °· Illnesses such as diabetes, hepatitis, gallbladder problems, appendicitis, kidney problems, cancer, sepsis, atypical symptoms of a heart attack, or eating disorders. °· Medical treatments such as chemotherapy and radiation. °· Receiving medicine that makes you sleep (general anesthetic) during surgery. °DIAGNOSIS °Your caregiver may ask for tests to be done if the problems do not improve after a few days. Tests may also be done if symptoms are severe or if the reason for the nausea and vomiting is not clear. Tests may include: °· Urine tests. °· Blood tests. °· Stool tests. °· Cultures (to look for evidence of infection). °· X-rays or other imaging studies. °Test results can help your caregiver make decisions about treatment or the need for additional tests. °TREATMENT °You need to stay well hydrated. Drink frequently but in small amounts. You may wish to drink water, sports drinks, clear broth, or eat frozen ice pops or gelatin dessert to help stay hydrated. When you eat, eating slowly may help prevent nausea. There are also some antinausea medicines that may help prevent nausea. °HOME CARE INSTRUCTIONS  °· Take all medicine as directed by your caregiver. °· If you do not have an appetite, do not force yourself to eat. However, you must continue to drink fluids. °· If you have an appetite, eat a normal diet unless your caregiver tells you differently. °· Eat a variety of complex carbohydrates (rice, wheat, potatoes,  bread), lean meats, yogurt, fruits, and vegetables. °· Avoid high-fat foods because they are more difficult to digest. °· Drink enough water and fluids to keep your urine clear or pale yellow. °· If you are dehydrated, ask your caregiver for specific rehydration instructions. Signs of dehydration may include: °· Severe thirst. °· Dry lips and mouth. °· Dizziness. °· Dark urine. °· Decreasing urine frequency and amount. °· Confusion. °· Rapid breathing or pulse. °SEEK IMMEDIATE MEDICAL CARE IF:  °· You have blood or brown flecks (like coffee grounds) in your vomit. °· You have black or bloody stools. °· You have a severe headache or stiff neck. °· You are confused. °· You have severe abdominal pain. °· You have chest pain or trouble breathing. °· You do not urinate at least once every 8 hours. °· You develop cold or clammy skin. °· You continue to vomit for longer than 24 to 48 hours. °· You have a fever. °MAKE SURE YOU:  °· Understand these instructions. °· Will watch your condition. °· Will get help right away if you are not doing well or get worse. °Document Released: 06/20/2005 Document Revised: 09/12/2011 Document Reviewed: 11/17/2010 °ExitCare® Patient Information ©2014 [employer]. ° °

## 2013-09-04 NOTE — ED Notes (Signed)
Nausea, vomiting and diarhea

## 2014-06-27 ENCOUNTER — Inpatient Hospital Stay (HOSPITAL_COMMUNITY)
Admission: AD | Admit: 2014-06-27 | Discharge: 2014-06-27 | Disposition: A | Discharge status: Home / Self Care | Payer: Medicaid - Out of State | Source: Ambulatory Visit | Attending: Emergency Medicine | Admitting: Emergency Medicine

## 2014-06-27 ENCOUNTER — Encounter (HOSPITAL_COMMUNITY): Payer: Self-pay | Admitting: *Deleted

## 2014-06-27 ENCOUNTER — Inpatient Hospital Stay (HOSPITAL_COMMUNITY): Payer: Medicaid - Out of State

## 2014-06-27 DIAGNOSIS — R059 Cough, unspecified: Secondary | ICD-10-CM

## 2014-06-27 DIAGNOSIS — R0602 Shortness of breath: Secondary | ICD-10-CM

## 2014-06-27 DIAGNOSIS — J189 Pneumonia, unspecified organism: Secondary | ICD-10-CM

## 2014-06-27 DIAGNOSIS — R05 Cough: Secondary | ICD-10-CM

## 2014-06-27 HISTORY — DX: Unspecified asthma, uncomplicated: J45.909

## 2014-06-27 LAB — I-STAT CHEM 8, ED
BUN: 8 mg/dL (ref 6–23)
CREATININE: 1.3 mg/dL (ref 0.50–1.35)
Calcium, Ion: 1.18 mmol/L (ref 1.12–1.23)
Chloride: 101 mEq/L (ref 96–112)
GLUCOSE: 114 mg/dL — AB (ref 70–99)
HCT: 42 % (ref 39.0–52.0)
HEMOGLOBIN: 14.3 g/dL (ref 13.0–17.0)
Potassium: 3 mmol/L — ABNORMAL LOW (ref 3.5–5.1)
Sodium: 142 mmol/L (ref 135–145)
TCO2: 24 mmol/L (ref 0–100)

## 2014-06-27 LAB — I-STAT TROPONIN, ED: Troponin i, poc: 0 ng/mL (ref 0.00–0.08)

## 2014-06-27 MED ORDER — SODIUM CHLORIDE 0.9 % IV SOLN
INTRAVENOUS | Status: DC
  Administered 2014-06-27: 15:00:00 via INTRAVENOUS

## 2014-06-27 MED ORDER — AZITHROMYCIN 250 MG PO TABS
500.0000 mg | ORAL_TABLET | Freq: Once | ORAL | Status: AC
  Administered 2014-06-27: 500 mg via ORAL
  Filled 2014-06-27: qty 2

## 2014-06-27 MED ORDER — AEROCHAMBER PLUS W/MASK MISC
1.0000 | Freq: Once | Status: DC
  Filled 2014-06-27: qty 1

## 2014-06-27 MED ORDER — AZITHROMYCIN 250 MG PO TABS: ORAL_TABLET | ORAL | Status: DC

## 2014-06-27 MED ORDER — IPRATROPIUM-ALBUTEROL 0.5-2.5 (3) MG/3ML IN SOLN
3.0000 mL | RESPIRATORY_TRACT | Status: DC
  Administered 2014-06-27: 3 mL via RESPIRATORY_TRACT
  Filled 2014-06-27 (×7): qty 3

## 2014-06-27 MED ORDER — POTASSIUM CHLORIDE CRYS ER 20 MEQ PO TBCR: 20.0000 meq | EXTENDED_RELEASE_TABLET | Freq: Every day | ORAL | Status: DC

## 2014-06-27 MED ORDER — CEFTRIAXONE SODIUM 1 G IJ SOLR
1.0000 g | Freq: Once | INTRAMUSCULAR | Status: AC
  Administered 2014-06-27: 1 g via INTRAVENOUS
  Filled 2014-06-27: qty 10

## 2014-06-27 MED ORDER — PREDNISONE 50 MG PO TABS
60.0000 mg | ORAL_TABLET | Freq: Once | ORAL | Status: AC
  Administered 2014-06-27: 60 mg via ORAL
  Filled 2014-06-27: qty 1

## 2014-06-27 MED ORDER — ACETAMINOPHEN 500 MG PO TABS
1000.0000 mg | ORAL_TABLET | Freq: Once | ORAL | Status: AC
  Administered 2014-06-27: 1000 mg via ORAL
  Filled 2014-06-27: qty 2

## 2014-06-27 MED ORDER — ALBUTEROL SULFATE HFA 108 (90 BASE) MCG/ACT IN AERS
2.0000 | INHALATION_SPRAY | RESPIRATORY_TRACT | Status: DC | PRN
  Administered 2014-06-27: 2 via RESPIRATORY_TRACT
  Filled 2014-06-27: qty 6.7

## 2014-06-27 MED ORDER — POTASSIUM CHLORIDE CRYS ER 20 MEQ PO TBCR
40.0000 meq | EXTENDED_RELEASE_TABLET | Freq: Once | ORAL | Status: AC
  Administered 2014-06-27: 40 meq via ORAL
  Filled 2014-06-27: qty 2

## 2014-06-27 MED ORDER — ONDANSETRON HCL 4 MG/2ML IJ SOLN
4.0000 mg | Freq: Once | INTRAMUSCULAR | Status: AC
  Administered 2014-06-27: 4 mg via INTRAVENOUS
  Filled 2014-06-27: qty 2

## 2014-06-27 MED ORDER — ALBUTEROL SULFATE (2.5 MG/3ML) 0.083% IN NEBU
5.0000 mg | INHALATION_SOLUTION | Freq: Once | RESPIRATORY_TRACT | Status: AC
  Administered 2014-06-27: 5 mg via RESPIRATORY_TRACT
  Filled 2014-06-27: qty 6

## 2014-06-27 NOTE — MAU Note (Signed)
Patient states he has a history of asthma, Is here with his daughter in labor and feels like he is short of breath and having difficulty breathing. Left medication at home.

## 2014-06-27 NOTE — ED Provider Notes (Signed)
CSN: [id number]     Arrival date & time 06/27/14  1422 History   First MD Initiated Contact with Patient 06/27/14 1543     Chief Complaint  Patient presents with  . Asthma     (Consider location/radiation/quality/duration/timing/severity/associated sxs/prior Treatment) HPI Developed shortness of breath typical of asthma counted by nausea  onset 10 AM today while at [facility] to observe his grandchild being born. He also reports nonproductive cough for the past 2-3 days. He went to the [facility] at [facility] was treated with one nebulized treatment and oral prednisone and Zofran with much improvement of breathing. He denies nausea at present. Denies chest pain denies other associated symptoms. Breathing presently almost normal Past Medical History  Diagnosis Date  . Hypertension   . Asthma    Past Surgical History  Procedure Laterality Date  . Appendectomy     History reviewed. No pertinent family history. History  Substance Use Topics  . Smoking status: Former Research scientist (life sciences)  . Smokeless tobacco: Not on file  . Alcohol Use: No     Comment: occ  'current smoker, no alcohol no drugs  Review of Systems  Constitutional: Negative.   HENT: Negative.   Respiratory: Positive for cough and shortness of breath.   Cardiovascular: Negative.   Gastrointestinal: Positive for nausea.  Musculoskeletal: Negative.   Skin: Negative.   Neurological: Negative.   Psychiatric/Behavioral: Negative.   All other systems reviewed and are negative.     Allergies  Omnipaque and Shellfish allergy  Home Medications   Prior to Admission medications   Medication Sig Start Date End Date Taking? Authorizing Provider  hydrochlorothiazide (HYDRODIURIL) 25 MG tablet Take 25 mg by mouth daily.   Yes Historical Provider, MD  lisinopril (PRINIVIL,ZESTRIL) 40 MG tablet Take 40 mg by mouth daily.   Yes Historical Provider, MD  PRESCRIPTION MEDICATION Take 1 tablet by mouth daily. Pt reports taking Rx  treating hypertension; unknown drug/strength.   Yes Historical Provider, MD  ondansetron (ZOFRAN-ODT) 8 MG disintegrating tablet Take 1 tablet (8 mg total) by mouth every 8 (eight) hours as needed for nausea or vomiting. Patient not taking: Reported on 06/27/2014 09/04/13   Jasper Riling. Alvino Chapel, MD  promethazine (PHENERGAN) 25 MG tablet Take 1 tablet (25 mg total) by mouth every 6 (six) hours as needed for nausea. Patient not taking: Reported on 06/27/2014 09/04/13   Jasper Riling. Pickering, MD   BP 159/102 mmHg  Pulse 84  Temp(Src) 98.4 F (36.9 C) (Oral)  Resp 22  SpO2 100% Physical Exam  Constitutional: He appears well-developed and well-nourished. No distress.  HENT:  Head: Normocephalic and atraumatic.  Eyes: Conjunctivae are normal. Pupils are equal, round, and reactive to light.  Neck: Neck supple. No tracheal deviation present. No thyromegaly present.  Cardiovascular: Normal rate and regular rhythm.   No murmur heard. Pulmonary/Chest: Effort normal. No respiratory distress.  Scant diffuse rhonchi . speaks in paragraphs no respiratory distress  Abdominal: Soft. Bowel sounds are normal. He exhibits no distension. There is no tenderness.  Musculoskeletal: Normal range of motion. He exhibits no edema or tenderness.  Neurological: He is alert. Coordination normal.  Skin: Skin is warm and dry. No rash noted.  Psychiatric: He has a normal mood and affect.  Nursing note and vitals reviewed.   ED Course  Procedures (including critical care time) Labs Review Labs Reviewed - No data to display  Imaging Review No results found.   EKG Interpretation   Date/Time:  Friday June 27 2014  15:47:59 EST Ventricular Rate:  86 PR Interval:  160 QRS Duration: 87 QT Interval:  370 QTC Calculation: 442 R Axis:   34 Text Interpretation:  Sinus rhythm Left atrial enlargement Baseline wander  in lead(s) V2 No significant change since last tracing Confirmed by  Winfred Leeds  MD, Audine Mangione [phone]) on  06/27/2014 4:16:30 PM     5:15 PM patient states "I feel fine", after treatment with second nebulized treatment. Results for orders placed or performed during the hospital encounter of 06/27/14  I-stat troponin, ED  Result Value Ref Range   Troponin i, poc 0.00 0.00 - 0.08 ng/mL   Comment 3          I-stat chem 8, ed  Result Value Ref Range   Sodium 142 135 - 145 mmol/L   Potassium 3.0 (L) 3.5 - 5.1 mmol/L   Chloride 101 96 - 112 mEq/L   BUN 8 6 - 23 mg/dL   Creatinine, Ser 1.30 0.50 - 1.35 mg/dL   Glucose, Bld 114 (H) 70 - 99 mg/dL   Calcium, Ion 1.18 1.12 - 1.23 mmol/L   TCO2 24 0 - 100 mmol/L   Hemoglobin 14.3 13.0 - 17.0 g/dL   HCT 42.0 39.0 - 52.0 %   Dg Chest 2 View  06/27/2014   CLINICAL DATA:  Short of breath.  Cough.  Fever per 2 day  EXAM: CHEST  2 VIEW  COMPARISON:  11/23/2011  FINDINGS: Low volumes. Bibasilar atelectasis versus airspace disease. Upper normal heart size. No pneumothorax. No pleural effusion.  IMPRESSION: Bibasilar atelectasis versus airspace disease.   Electronically Signed   By: Maryclare Bean M.D.   On: 06/27/2014 17:45    MDM  PORT score =51, which makes patient candidate for outpatient therapy for pneumonia  In light of patient's cough and chest x-ray findings we'll treat empirically for community acquired pneumonia plan Rocephin 1 g IV prior to discharge, prescriptions Zithromax. Albuterol HFA with spacer to go T's 2 puffs every 4 hours. Cough or shortness of breath. Prescription K Duras patient has chronically low potassium. I counseled patient for 5 minutes on smoking cessation. He should get his serum potassium rechecked within the next 2 weeks by his primary care physician in [place]. Also blood pressure recheck within the next 2-3 weeks Diagnosis #1 community acquired pneumonia #2 hypokalemia #3 hypertension #4 tobacco abuse Final diagnoses:  Shortness of breath        Orlie Dakin, MD 06/27/14 1939

## 2014-06-27 NOTE — ED Notes (Signed)
Per [facility], [facility], pt had asthma attack while in the room. [facility], unremarkable. [facility]b at [facility]. AAOX4, ambulatory.

## 2014-06-27 NOTE — MAU Provider Note (Signed)
CSN: [id number]     Arrival date & time 06/27/14  1422 History   None    Chief Complaint  Patient presents with  . Asthma     (Consider location/radiation/quality/duration/timing/severity/associated sxs/prior Treatment) Patient is a 53 y.o. male presenting with asthma and shortness of breath.  Asthma Pertinent negatives include no chest pain.  Shortness of Breath  The current episode started today. The problem occurs continuously. The problem has been gradually worsening. The problem is moderate. Nothing relieves the symptoms. Associated symptoms include chest pressure, shortness of breath and wheezing. Pertinent negatives include no chest pain. Her past medical history is significant for asthma.   Ethan Schmidt is a 53 y.o. male who presents to the MAU with shortness of breath, nausea and a feeling of burning in the epigastric area. He states he came to MAU because his daughter is in labor. He has a hx of asthma but has not had to use his inhaler in over 2 months. He was rushing and excited when he got the call about his daughter. After he arrived here he began feeling short of breath having nausea and feeling like he had burning in his epigastric area. He does not have his inhaler with him.   Past Medical History  Diagnosis Date  . Hypertension    No past surgical history on file. No family history on file. History  Substance Use Topics  . Smoking status: Current Every Day Smoker  . Smokeless tobacco: Not on file  . Alcohol Use: Yes     Comment: occ   OB History    No data available     Review of Systems  Respiratory: Positive for shortness of breath and wheezing. Chest tightness: burning.   Cardiovascular: Negative for chest pain, palpitations and leg swelling.  all other systems negative    Allergies  Omnipaque and Shellfish allergy  Home Medications   Prior to Admission medications   Medication Sig Start Date End Date Taking? Authorizing Provider   lisinopril-hydrochlorothiazide (PRINZIDE,ZESTORETIC) 20-12.5 MG per tablet Take 1 tablet by mouth daily.    Historical Provider, MD  ondansetron (ZOFRAN-ODT) 8 MG disintegrating tablet Take 1 tablet (8 mg total) by mouth every 8 (eight) hours as needed for nausea or vomiting. 09/04/13   Jasper Riling. Alvino Chapel, MD  promethazine (PHENERGAN) 25 MG tablet Take 1 tablet (25 mg total) by mouth every 6 (six) hours as needed for nausea. 09/04/13   Jasper Riling. Pickering, MD   BP 178/107 mmHg  Pulse 82  Temp(Src) 98.4 F (36.9 C) (Oral)  Resp 22  SpO2 98% Physical Exam  Constitutional: She is oriented to person, place, and time. She appears well-developed and well-nourished.  HENT:  Head: Normocephalic and atraumatic.  Mouth/Throat: Uvula is midline and oropharynx is clear and moist.  Eyes: Conjunctivae and EOM are normal.  Neck: Trachea normal. Neck supple. Normal carotid pulses and no JVD present. Carotid bruit is not present.  Cardiovascular: Normal rate and regular rhythm.   Elevated BP  Pulmonary/Chest: Effort normal. She has decreased breath sounds. She has wheezes in the left upper field and the left middle field.  Abdominal: Soft. There is no tenderness.  Musculoskeletal: Normal range of motion.  Neurological: She is alert and oriented to person, place, and time. No cranial nerve deficit.  Skin: Skin is warm and dry.  Psychiatric: She has a normal mood and affect. Her behavior is normal.  Nursing note and vitals reviewed.   ED Course  Procedures Imaging Review No  results found.  IV NS @ KVO, Prednisone 60 mg PO, Duoneb, Zofran 4 mg IV EKG: rate 76 Normal sinus rhythm No ST elevation  After Duoneb patient feeling some better, less wheezing,   MDM  53 y.o. male with shortness of breath, nausea and wheezing that started suddenly while visiting his daughter in labor at [facility] hospital. Stable for transport via [facility] to [id number] ED for further evaluation. O2 SAT 100%  Consult with  Dr. Aline Brochure at [facility] and he will accept the patient.

## 2014-06-27 NOTE — Discharge Instructions (Signed)
Pneumonia, Adult  Get your blood pressure rechecked by your primary care physician within the next 2-3 weeks. Today's was elevated at 163/91. Ask your primary care physician to recheck your blood potassium within the next 2 weeks. Today's was low at 3.0. Also ask your doctor to help you to stop smoking. Use your inhaler as needed. Return if you feel worse for any reason Pneumonia is an infection of the lungs. It may be caused by a germ (virus or bacteria). Some types of pneumonia can spread easily from person to person. This can happen when you cough or sneeze. HOME CARE  Only take medicine as told by your doctor.  Take your medicine (antibiotics) as told. Finish it even if you start to feel better.  Do not smoke.  You may use a vaporizer or humidifier in your room. This can help loosen thick spit (mucus).  Sleep so you are almost sitting up (semi-upright). This helps reduce coughing.  Rest. A shot (vaccine) can help prevent pneumonia. Shots are often advised for:  People over 66 years old.  Patients on chemotherapy.  People with long-term (chronic) lung problems.  People with immune system problems. GET HELP RIGHT AWAY IF:   You are getting worse.  You cannot control your cough, and you are losing sleep.  You cough up blood.  Your pain gets worse, even with medicine.  You have a fever.  Any of your problems are getting worse, not better.  You have shortness of breath or chest pain. MAKE SURE YOU:   Understand these instructions.  Will watch your condition.  Will get help right away if you are not doing well or get worse. Document Released: 12/07/2007 Document Revised: 09/12/2011 Document Reviewed: 09/10/2010 [facility] Patient Information 2015 [place], [place]. This information is not intended to replace advice given to you by your health care provider. Make sure you discuss any questions you have with your health care provider.

## 2014-06-29 ENCOUNTER — Emergency Department (HOSPITAL_COMMUNITY): Payer: Medicaid - Out of State

## 2014-06-29 ENCOUNTER — Inpatient Hospital Stay (HOSPITAL_COMMUNITY)
Admission: EM | Admit: 2014-06-29 | Discharge: 2014-06-30 | DRG: 194 | Disposition: A | Discharge status: Home / Self Care | Payer: Medicaid - Out of State | Attending: Family Medicine | Admitting: Family Medicine

## 2014-06-29 ENCOUNTER — Encounter (HOSPITAL_COMMUNITY): Payer: Self-pay | Admitting: Emergency Medicine

## 2014-06-29 DIAGNOSIS — Z720 Tobacco use: Secondary | ICD-10-CM | POA: Diagnosis present

## 2014-06-29 DIAGNOSIS — J189 Pneumonia, unspecified organism: Principal | ICD-10-CM

## 2014-06-29 DIAGNOSIS — I129 Hypertensive chronic kidney disease with stage 1 through stage 4 chronic kidney disease, or unspecified chronic kidney disease: Secondary | ICD-10-CM | POA: Diagnosis present

## 2014-06-29 DIAGNOSIS — F17210 Nicotine dependence, cigarettes, uncomplicated: Secondary | ICD-10-CM | POA: Diagnosis present

## 2014-06-29 DIAGNOSIS — N183 Chronic kidney disease, stage 3 unspecified: Secondary | ICD-10-CM

## 2014-06-29 DIAGNOSIS — R0602 Shortness of breath: Secondary | ICD-10-CM

## 2014-06-29 DIAGNOSIS — N179 Acute kidney failure, unspecified: Secondary | ICD-10-CM | POA: Diagnosis present

## 2014-06-29 DIAGNOSIS — Z79899 Other long term (current) drug therapy: Secondary | ICD-10-CM

## 2014-06-29 DIAGNOSIS — J45901 Unspecified asthma with (acute) exacerbation: Secondary | ICD-10-CM | POA: Diagnosis present

## 2014-06-29 LAB — COMPREHENSIVE METABOLIC PANEL
ALBUMIN: 3.4 g/dL — AB (ref 3.5–5.2)
ALT: 27 U/L (ref 0–53)
ALT: 25 U/L (ref 0–53)
ANION GAP: 10 (ref 5–15)
AST: 36 U/L (ref 0–37)
AST: 34 U/L (ref 0–37)
Albumin: 3.8 g/dL (ref 3.5–5.2)
Alkaline Phosphatase: 74 U/L (ref 39–117)
Alkaline Phosphatase: 64 U/L (ref 39–117)
Anion gap: 9 (ref 5–15)
BUN: 16 mg/dL (ref 6–23)
BUN: 16 mg/dL (ref 6–23)
CALCIUM: 8.7 mg/dL (ref 8.4–10.5)
CALCIUM: 8 mg/dL — AB (ref 8.4–10.5)
CHLORIDE: 103 meq/L (ref 96–112)
CO2: 25 mmol/L (ref 19–32)
CO2: 24 mmol/L (ref 19–32)
Chloride: 100 mEq/L (ref 96–112)
Creatinine, Ser: 1.71 mg/dL — ABNORMAL HIGH (ref 0.50–1.35)
Creatinine, Ser: 1.57 mg/dL — ABNORMAL HIGH (ref 0.50–1.35)
GFR calc Af Amer: 57 mL/min — ABNORMAL LOW (ref >90)
GFR calc non Af Amer: 49 mL/min — ABNORMAL LOW (ref >90)
GFR, EST AFRICAN AMERICAN: 52 mL/min — AB (ref >90)
GFR, EST NON AFRICAN AMERICAN: 45 mL/min — AB (ref >90)
GLUCOSE: 134 mg/dL — AB (ref 70–99)
Glucose, Bld: 121 mg/dL — ABNORMAL HIGH (ref 70–99)
Potassium: 3 mmol/L — ABNORMAL LOW (ref 3.5–5.1)
Potassium: 3.1 mmol/L — ABNORMAL LOW (ref 3.5–5.1)
Sodium: 135 mmol/L (ref 135–145)
Sodium: 136 mmol/L (ref 135–145)
TOTAL PROTEIN: 6.8 g/dL (ref 6.0–8.3)
Total Bilirubin: 0.3 mg/dL (ref 0.3–1.2)
Total Bilirubin: 0.4 mg/dL (ref 0.3–1.2)
Total Protein: 7.6 g/dL (ref 6.0–8.3)

## 2014-06-29 LAB — CBC WITH DIFFERENTIAL/PLATELET
BASOS ABS: 0 10*3/uL (ref 0.0–0.1)
Basophils Absolute: 0 10*3/uL (ref 0.0–0.1)
Basophils Relative: 0 % (ref 0–1)
Basophils Relative: 0 % (ref 0–1)
EOS PCT: 0 % (ref 0–5)
EOS PCT: 0 % (ref 0–5)
Eosinophils Absolute: 0 10*3/uL (ref 0.0–0.7)
Eosinophils Absolute: 0 10*3/uL (ref 0.0–0.7)
HEMATOCRIT: 37.5 % — AB (ref 39.0–52.0)
Hemoglobin: 12.6 g/dL — ABNORMAL LOW (ref 13.0–17.0)
LYMPHS ABS: 0.9 10*3/uL (ref 0.7–4.0)
LYMPHS PCT: 12 % (ref 12–46)
LYMPHS PCT: 11 % — AB (ref 12–46)
Lymphs Abs: 0.9 10*3/uL (ref 0.7–4.0)
MCH: 31.3 pg (ref 26.0–34.0)
MCHC: 33.6 g/dL (ref 30.0–36.0)
MCV: 93.1 fL (ref 78.0–100.0)
MONO ABS: 0.5 10*3/uL (ref 0.1–1.0)
Monocytes Absolute: 0.5 10*3/uL (ref 0.1–1.0)
Monocytes Relative: 6 % (ref 3–12)
Monocytes Relative: 6 % (ref 3–12)
Neutro Abs: 6.2 10*3/uL (ref 1.7–7.7)
Neutro Abs: 6.5 10*3/uL (ref 1.7–7.7)
Neutrophils Relative %: 82 % — ABNORMAL HIGH (ref 43–77)
Neutrophils Relative %: 83 % — ABNORMAL HIGH (ref 43–77)
Platelets: 334 10*3/uL (ref 150–400)
RBC: 4.03 MIL/uL — ABNORMAL LOW (ref 4.22–5.81)
RDW: 13.5 % (ref 11.5–15.5)
WBC: 7.6 10*3/uL (ref 4.0–10.5)

## 2014-06-29 LAB — TROPONIN I
Troponin I: <0.03 ng/mL (ref <0.031)
Troponin I: <0.03 ng/mL (ref <0.031)

## 2014-06-29 LAB — RAPID URINE DRUG SCREEN, HOSP PERFORMED
AMPHETAMINES: NOT DETECTED
BARBITURATES: NOT DETECTED
BENZODIAZEPINES: NOT DETECTED
COCAINE: POSITIVE — AB
OPIATES: POSITIVE — AB
TETRAHYDROCANNABINOL: POSITIVE — AB

## 2014-06-29 LAB — CBC
HCT: 36.2 % — ABNORMAL LOW (ref 39.0–52.0)
Hemoglobin: 11.9 g/dL — ABNORMAL LOW (ref 13.0–17.0)
MCH: 30.7 pg (ref 26.0–34.0)
MCHC: 32.9 g/dL (ref 30.0–36.0)
MCV: 93.5 fL (ref 78.0–100.0)
Platelets: 333 10*3/uL (ref 150–400)
RBC: 3.87 MIL/uL — ABNORMAL LOW (ref 4.22–5.81)
RDW: 13.6 % (ref 11.5–15.5)
WBC: 7.7 10*3/uL (ref 4.0–10.5)

## 2014-06-29 LAB — MAGNESIUM: MAGNESIUM: 1.5 mg/dL (ref 1.5–2.5)

## 2014-06-29 LAB — CREATININE, SERUM
CREATININE: 1.56 mg/dL — AB (ref 0.50–1.35)
GFR calc Af Amer: 58 mL/min — ABNORMAL LOW (ref >90)
GFR calc non Af Amer: 50 mL/min — ABNORMAL LOW (ref >90)

## 2014-06-29 LAB — STREP PNEUMONIAE URINARY ANTIGEN: Strep Pneumo Urinary Antigen: NEGATIVE

## 2014-06-29 LAB — BRAIN NATRIURETIC PEPTIDE: B NATRIURETIC PEPTIDE 5: 25 pg/mL (ref 0.0–100.0)

## 2014-06-29 MED ORDER — TRAMADOL HCL 50 MG PO TABS: 50.0000 mg | ORAL_TABLET | Freq: Once | ORAL | Status: DC

## 2014-06-29 MED ORDER — MORPHINE SULFATE 2 MG/ML IJ SOLN
1.0000 mg | Freq: Once | INTRAMUSCULAR | Status: AC
  Administered 2014-06-29: 2 mg via INTRAVENOUS
  Filled 2014-06-29: qty 1

## 2014-06-29 MED ORDER — SODIUM CHLORIDE 0.9 % IV BOLUS (SEPSIS)
500.0000 mL | Freq: Once | INTRAVENOUS | Status: AC
  Administered 2014-06-29: 500 mL via INTRAVENOUS

## 2014-06-29 MED ORDER — ONDANSETRON HCL 4 MG/2ML IJ SOLN
4.0000 mg | Freq: Once | INTRAMUSCULAR | Status: AC
  Administered 2014-06-29: 4 mg via INTRAVENOUS
  Filled 2014-06-29: qty 2

## 2014-06-29 MED ORDER — IPRATROPIUM-ALBUTEROL 0.5-2.5 (3) MG/3ML IN SOLN
3.0000 mL | Freq: Three times a day (TID) | RESPIRATORY_TRACT | Status: DC
  Administered 2014-06-30: 3 mL via RESPIRATORY_TRACT
  Filled 2014-06-29: qty 3

## 2014-06-29 MED ORDER — LEVOFLOXACIN IN D5W 750 MG/150ML IV SOLN
750.0000 mg | Freq: Once | INTRAVENOUS | Status: AC
  Administered 2014-06-29: 750 mg via INTRAVENOUS
  Filled 2014-06-29: qty 150

## 2014-06-29 MED ORDER — HEPARIN SODIUM (PORCINE) 5000 UNIT/ML IJ SOLN
5000.0000 [IU] | Freq: Three times a day (TID) | INTRAMUSCULAR | Status: DC
  Administered 2014-06-29 – 2014-06-30 (×4): 5000 [IU] via SUBCUTANEOUS
  Filled 2014-06-29 (×4): qty 1

## 2014-06-29 MED ORDER — MORPHINE SULFATE 4 MG/ML IJ SOLN
4.0000 mg | Freq: Once | INTRAMUSCULAR | Status: AC
  Administered 2014-06-29: 4 mg via INTRAVENOUS
  Filled 2014-06-29: qty 1

## 2014-06-29 MED ORDER — PREDNISONE 20 MG PO TABS
60.0000 mg | ORAL_TABLET | Freq: Every day | ORAL | Status: DC
  Administered 2014-06-29 – 2014-06-30 (×2): 60 mg via ORAL
  Filled 2014-06-29 (×2): qty 3

## 2014-06-29 MED ORDER — IPRATROPIUM-ALBUTEROL 0.5-2.5 (3) MG/3ML IN SOLN
3.0000 mL | RESPIRATORY_TRACT | Status: DC | PRN
  Administered 2014-06-29: 3 mL via RESPIRATORY_TRACT
  Filled 2014-06-29: qty 3

## 2014-06-29 MED ORDER — SODIUM CHLORIDE 0.9 % IV BOLUS (SEPSIS)
1000.0000 mL | Freq: Once | INTRAVENOUS | Status: AC
  Administered 2014-06-29: 1000 mL via INTRAVENOUS

## 2014-06-29 MED ORDER — IPRATROPIUM-ALBUTEROL 0.5-2.5 (3) MG/3ML IN SOLN
3.0000 mL | RESPIRATORY_TRACT | Status: DC
  Administered 2014-06-29 (×4): 3 mL via RESPIRATORY_TRACT
  Filled 2014-06-29 (×4): qty 3

## 2014-06-29 MED ORDER — ACETAMINOPHEN 500 MG PO TABS
1000.0000 mg | ORAL_TABLET | Freq: Once | ORAL | Status: AC
  Administered 2014-06-29: 1000 mg via ORAL
  Filled 2014-06-29: qty 2

## 2014-06-29 MED ORDER — METHYLPREDNISOLONE SODIUM SUCC 125 MG IJ SOLR
125.0000 mg | Freq: Two times a day (BID) | INTRAMUSCULAR | Status: DC
  Administered 2014-06-29: 125 mg via INTRAVENOUS
  Filled 2014-06-29: qty 2

## 2014-06-29 MED ORDER — LEVOFLOXACIN IN D5W 750 MG/150ML IV SOLN
750.0000 mg | INTRAVENOUS | Status: DC
  Filled 2014-06-29: qty 150

## 2014-06-29 MED ORDER — LEVOFLOXACIN 500 MG PO TABS
500.0000 mg | ORAL_TABLET | Freq: Every day | ORAL | Status: DC
  Administered 2014-06-30: 500 mg via ORAL
  Filled 2014-06-29: qty 1

## 2014-06-29 MED ORDER — POTASSIUM CHLORIDE IN NACL 40-0.9 MEQ/L-% IV SOLN
INTRAVENOUS | Status: DC
  Administered 2014-06-29 (×2): 100 mL/h via INTRAVENOUS
  Filled 2014-06-29 (×4): qty 1000

## 2014-06-29 MED ORDER — LEVOFLOXACIN IN D5W 750 MG/150ML IV SOLN
750.0000 mg | INTRAVENOUS | Status: DC
  Administered 2014-06-29: 750 mg via INTRAVENOUS

## 2014-06-29 MED ORDER — ACETAMINOPHEN 500 MG PO TABS
500.0000 mg | ORAL_TABLET | Freq: Four times a day (QID) | ORAL | Status: DC | PRN
  Administered 2014-06-29 – 2014-06-30 (×4): 500 mg via ORAL
  Filled 2014-06-29 (×4): qty 1

## 2014-06-29 MED ORDER — SIMETHICONE 80 MG PO CHEW
80.0000 mg | CHEWABLE_TABLET | Freq: Once | ORAL | Status: AC
  Administered 2014-06-29: 80 mg via ORAL
  Filled 2014-06-29: qty 1

## 2014-06-29 MED ORDER — IPRATROPIUM-ALBUTEROL 0.5-2.5 (3) MG/3ML IN SOLN: 3.0000 mL | RESPIRATORY_TRACT | Status: DC | PRN

## 2014-06-29 MED ORDER — SODIUM CHLORIDE 0.9 % IJ SOLN
3.0000 mL | Freq: Two times a day (BID) | INTRAMUSCULAR | Status: DC
  Administered 2014-06-30: 3 mL via INTRAVENOUS

## 2014-06-29 NOTE — ED Notes (Signed)
Patient states he started "feeling bad" 3 days ago. Patient states he was diagnosed with pneumonia and given antibiotics yesterday at [facility] in [place]. Patient states he did not get his antibiotics filled, and tonight it is the same pain "just worse" patient is A&OX4 at this time

## 2014-06-29 NOTE — ED Notes (Signed)
Patient reports chest pain and shortness of breath that started tonight. Recently seen and treated at [facility]. Diagnosed with pneumonia.

## 2014-06-29 NOTE — ED Notes (Signed)
Patient positioned in bed in a position of stated comfort. Family at bedside. Drink provided to family member at this time, patient reqesting soda to drink at this time, explained to patient that the EDP has to clear him to drink, patient states understanding.

## 2014-06-29 NOTE — ED Provider Notes (Signed)
CSN: [id number]     Arrival date & time 06/29/14  0058 History   First MD Initiated Contact with Patient 06/29/14 0146     Chief Complaint  Patient presents with  . Chest Pain  . Shortness of Breath     (Consider location/radiation/quality/duration/timing/severity/associated sxs/prior Treatment) HPI Patient was recently diagnosed with pneumonia 12/25. He was given IV Rocephin in the ED and a prescription for Zithromax. Patient states he did not fill the prescription. His has since had increased shortness of breath. Also complains of central chest pain worse with deep inspiration. Denies any lower extremities swelling or pain. He's had low-grade fever. Patient also complains of generalized headache. This gradual onset. Associated with photophobia. Mild nausea but no vomiting. Denies neck pain or stiffness. Past Medical History  Diagnosis Date  . Hypertension   . Asthma    Past Surgical History  Procedure Laterality Date  . Appendectomy     History reviewed. No pertinent family history. History  Substance Use Topics  . Smoking status: Former Smoker -- 0.25 packs/day  . Smokeless tobacco: Current User  . Alcohol Use: No     Comment: occ    Review of Systems  Constitutional: Positive for fever, chills and fatigue.  HENT: Negative for congestion.   Respiratory: Positive for cough and shortness of breath. Negative for wheezing.   Cardiovascular: Positive for chest pain. Negative for palpitations and leg swelling.  Gastrointestinal: Negative for nausea, vomiting, abdominal pain and diarrhea.  Musculoskeletal: Negative for back pain, neck pain and neck stiffness.  Skin: Negative for rash and wound.  Neurological: Positive for headaches. Negative for dizziness, weakness, light-headedness and numbness.  All other systems reviewed and are negative.     Allergies  Omnipaque and Shellfish allergy  Home Medications   Prior to Admission medications   Medication Sig Start Date End  Date Taking? Authorizing Provider  Chlorphen-Phenyleph-ASA (ALKA-SELTZER PLUS COLD PO) Take 2 capsules by mouth every 4 (four) hours as needed (cold/congestion).   Yes Historical Provider, MD  hydrochlorothiazide (HYDRODIURIL) 25 MG tablet Take 25 mg by mouth daily.   Yes Historical Provider, MD  lisinopril (PRINIVIL,ZESTRIL) 40 MG tablet Take 40 mg by mouth daily.   Yes Historical Provider, MD  albuterol (PROVENTIL HFA;VENTOLIN HFA) 108 (90 BASE) MCG/ACT inhaler Inhale 2 puffs into the lungs every 6 (six) hours as needed for wheezing or shortness of breath. 06/30/14   Nita Sells, MD  dextromethorphan (DELSYM) 30 MG/5ML liquid Take 2.5 mLs (15 mg total) by mouth 2 (two) times daily. 06/30/14   Nita Sells, MD  levofloxacin (LEVAQUIN) 500 MG tablet Take 1 tablet (500 mg total) by mouth daily. 06/30/14   Nita Sells, MD  ondansetron (ZOFRAN-ODT) 8 MG disintegrating tablet Take 1 tablet (8 mg total) by mouth every 8 (eight) hours as needed for nausea or vomiting. Patient not taking: Reported on 06/27/2014 09/04/13   Jasper Riling. Alvino Chapel, MD  predniSONE (DELTASONE) 20 MG tablet Take 3 tablets (60 mg total) by mouth daily before breakfast. 06/30/14   Nita Sells, MD  promethazine (PHENERGAN) 25 MG tablet Take 1 tablet (25 mg total) by mouth every 6 (six) hours as needed for nausea. Patient not taking: Reported on 06/27/2014 09/04/13   Jasper Riling. Pickering, MD   BP 130/74 mmHg  Pulse 76  Temp(Src) 98.4 F (36.9 C) (Oral)  Resp 20  Ht 6' (1.829 m)  Wt 228 lb 6.3 oz (103.6 kg)  BMI 30.97 kg/m2  SpO2 100% Physical Exam  Constitutional: He  is oriented to person, place, and time. He appears well-developed and well-nourished. No distress.  HENT:  Head: Normocephalic and atraumatic.  Mouth/Throat: Oropharynx is clear and moist. No oropharyngeal exudate.  Eyes: EOM are normal. Pupils are equal, round, and reactive to light.  Neck: Normal range of motion. Neck supple.  No  meningismus  Cardiovascular: Normal rate and regular rhythm.   Pulmonary/Chest: Breath sounds normal. No respiratory distress. He has no wheezes. He has no rales. He exhibits no tenderness.  Increased respiratory effort. Tachypnea  Abdominal: Soft. Bowel sounds are normal. He exhibits no distension and no mass. There is no tenderness. There is no rebound and no guarding.  Musculoskeletal: Normal range of motion. He exhibits no edema or tenderness.  No calf swelling or tenderness.  Neurological: He is alert and oriented to person, place, and time.  Patient is alert and oriented x3 with clear, goal oriented speech. Patient has 5/5 motor in all extremities. Sensation is intact to light touch.   Skin: Skin is warm and dry. No rash noted. No erythema.  Psychiatric: He has a normal mood and affect. His behavior is normal.  Nursing note and vitals reviewed.   ED Course  Procedures (including critical care time) Labs Review Labs Reviewed  CBC WITH DIFFERENTIAL - Abnormal; Notable for the following:    RBC 4.03 (*)    Hemoglobin 12.6 (*)    HCT 37.5 (*)    Neutrophils Relative % 82 (*)    All other components within normal limits  COMPREHENSIVE METABOLIC PANEL - Abnormal; Notable for the following:    Potassium 3.0 (*)    Glucose, Bld 134 (*)    Creatinine, Ser 1.71 (*)    GFR calc non Af Amer 45 (*)    GFR calc Af Amer 52 (*)    All other components within normal limits  CBC - Abnormal; Notable for the following:    RBC 3.87 (*)    Hemoglobin 11.9 (*)    HCT 36.2 (*)    All other components within normal limits  CREATININE, SERUM - Abnormal; Notable for the following:    Creatinine, Ser 1.56 (*)    GFR calc non Af Amer 50 (*)    GFR calc Af Amer 58 (*)    All other components within normal limits  URINE RAPID DRUG SCREEN (HOSP PERFORMED) - Abnormal; Notable for the following:    Opiates POSITIVE (*)    Cocaine POSITIVE (*)    Tetrahydrocannabinol POSITIVE (*)    All other  components within normal limits  COMPREHENSIVE METABOLIC PANEL - Abnormal; Notable for the following:    Potassium 3.1 (*)    Glucose, Bld 121 (*)    Creatinine, Ser 1.57 (*)    Calcium 8.0 (*)    Albumin 3.4 (*)    GFR calc non Af Amer 49 (*)    GFR calc Af Amer 57 (*)    All other components within normal limits  CBC WITH DIFFERENTIAL - Abnormal; Notable for the following:    Neutrophils Relative % 83 (*)    Lymphocytes Relative 11 (*)    All other components within normal limits  COMPREHENSIVE METABOLIC PANEL - Abnormal; Notable for the following:    Glucose, Bld 125 (*)    GFR calc non Af Amer 60 (*)    GFR calc Af Amer 69 (*)    All other components within normal limits  CBC WITH DIFFERENTIAL - Abnormal; Notable for the following:  WBC 14.2 (*)    RBC 3.91 (*)    Hemoglobin 12.4 (*)    HCT 36.5 (*)    Neutrophils Relative % 82 (*)    Neutro Abs 11.7 (*)    Lymphocytes Relative 10 (*)    Monocytes Absolute 1.1 (*)    All other components within normal limits  CULTURE, BLOOD (ROUTINE X 2)  CULTURE, BLOOD (ROUTINE X 2)  CULTURE, EXPECTORATED SPUTUM-ASSESSMENT  GRAM STAIN  TROPONIN I  BRAIN NATRIURETIC PEPTIDE  TROPONIN I  TROPONIN I  TROPONIN I  MAGNESIUM  HIV ANTIBODY (ROUTINE TESTING)  STREP PNEUMONIAE URINARY ANTIGEN  LEGIONELLA ANTIGEN, URINE    Imaging Review No results found.   EKG Interpretation   Date/Time:  Sunday June 29 2014 01:22:55 EST Ventricular Rate:  93 PR Interval:  134 QRS Duration: 84 QT Interval:  324 QTC Calculation: 402 R Axis:   70 Text Interpretation:  Normal sinus rhythm Nonspecific ST and T wave  abnormality Abnormal ECG No significant change since last tracing  Confirmed by Christy Gentles  MD, Nile ([id number]) on 06/29/2014 9:16:02 AM      MDM   Final diagnoses:  SOB (shortness of breath)   Patient started on antibiotics and will be admitted.    Julianne Rice, MD 07/01/14 [phone]

## 2014-06-29 NOTE — ED Notes (Signed)
Patient vomiting X1 at this time

## 2014-06-29 NOTE — Progress Notes (Signed)
ANTIBIOTIC CONSULT NOTE - FOLLOW UP  Pharmacy Consult for Levaquin (renal dose adjustment) Indication: pneumonia  Allergies  Allergen Reactions  . Omnipaque [Iohexol] Hives    Pt. Did fine with 1 hour pre medication.  Marland Kitchen Shellfish Allergy     Patient Measurements: Height: 6' (182.9 cm) Weight: 228 lb 6.3 oz (103.6 kg) IBW/kg (Calculated) : 77.6  Vital Signs: Temp: 99.7 F (37.6 C) (12/27 0523) Temp Source: Oral (12/27 0523) BP: 151/84 mmHg (12/27 0523) Pulse Rate: 87 (12/27 0523) Intake/Output from previous day: 12/26 0701 - 12/27 0700 In: 1500 [I.V.:1500] Out: 900 [Urine:900] Intake/Output from this shift: Total I/O In: -  Out: 400 [Urine:400]  Labs:  Recent Labs  06/27/14 1611 06/29/14 0213 06/29/14 0450  WBC  --  7.6 7.7  HGB 14.3 12.6* 11.9*  PLT  --  334 333  CREATININE 1.30 1.71* 1.56*   Estimated Creatinine Clearance: 69.7 mL/min (by C-G formula based on Cr of 1.56). No results for input(s): VANCOTROUGH, VANCOPEAK, VANCORANDOM, GENTTROUGH, GENTPEAK, GENTRANDOM, TOBRATROUGH, TOBRAPEAK, TOBRARND, AMIKACINPEAK, AMIKACINTROU, AMIKACIN in the last 72 hours.   Microbiology: Recent Results (from the past 720 hour(s))  Culture, blood (routine x 2) Call MD if unable to obtain prior to antibiotics being given     Status: None (Preliminary result)   Collection Time: 06/29/14  4:00 AM  Result Value Ref Range Status   Specimen Description BLOOD LEFT ARM  Final   Special Requests BOTTLES DRAWN AEROBIC AND ANAEROBIC 6CC BOTTLES  Final   Culture PENDING  Incomplete   Report Status PENDING  Incomplete  Culture, blood (routine x 2) Call MD if unable to obtain prior to antibiotics being given     Status: None (Preliminary result)   Collection Time: 06/29/14  5:01 AM  Result Value Ref Range Status   Specimen Description BLOOD RIGHT ARM  Final   Special Requests BOTTLES DRAWN AEROBIC AND ANAEROBIC 8CC BOTTLES  Final   Culture PENDING  Incomplete   Report Status PENDING   Incomplete    Anti-infectives    Start     Dose/Rate Route Frequency Ordered Stop   06/29/14 2200  levofloxacin (LEVAQUIN) IVPB 750 mg     750 mg100 mL/hr over 90 Minutes Intravenous Every 24 hours 06/29/14 0452 07/04/14 2159   06/29/14 0445  levofloxacin (LEVAQUIN) IVPB 750 mg  Status:  Discontinued     750 mg100 mL/hr over 90 Minutes Intravenous Every 24 hours 06/29/14 0439 06/29/14 0452   06/29/14 0330  levofloxacin (LEVAQUIN) IVPB 750 mg     750 mg100 mL/hr over 90 Minutes Intravenous  Once 06/29/14 0323 06/29/14 0500      Assessment: 53 yo M admitted with CAP and started on Levaquin per MD.  AKI noted.  Scr improved this am.  Estimated CrCl > 65ml/min.   Levaquin 12/27>>  Goal of Therapy:  Eradicate infection.  Plan:  Continue Levaquin 750mg  IV daily  Change to PO when clinically appropriate Monitor renal function and cx data   Biagio Borg 06/29/2014,8:34 AM

## 2014-06-29 NOTE — Progress Notes (Signed)
12:09 PM I agree with HPI/GPe and A/P per Dr. Ernestina Patches      53 y/o ? Resident of [place] who came to visit her daughter for granddaughter's birth. Got sick and transferred from the [facility]U to [facility] ED-port score = 51 so felt good outpatient candidate for. patient given 1 g Rocephin IV and prescription for Zithromax albuterol and told to follow-up with primary care physician Did not pick up antibiotics and return to [facility] ED 12/27, chest x-ray = left perihilar infiltrate, MAXIMUM TEMPERATURE 102, slightly tachycardic Of note his drug screen was positive for cocaine and marijuana and opiates and he had a "Christmas party" with his nephew on arrival to [place]. 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 Tobacco abuse 06/29/2014  . CAP (community acquired pneumonia) 06/29/2014  . Asthma exacerbation 06/29/2014  . AKI (acute kidney injury) 06/29/2014   Continue community acquired pneumonia coverage-transitioned today to by mouth Levaquin Undifferentiated respiratory failure-patient has history of asthma but also is a smoker-will need definitive spirometry as an outpatient in [place]. For now continue steroids as by mouth prednisone burst 60 mg 5 days, discontinue IV Solu-Medrol as no wheeze. Stage II-III kidney disease-monitor  Likely can discharge in a.m.  Verneita Griffes, MD [facility] [phone]

## 2014-06-29 NOTE — H&P (Signed)
Hospitalist Admission History and Physical  Patient name: Ethan Schmidt Medical record number: [id number] Date of birth: January 06, 1961 Age: 53 y.o. Gender: male  Primary Care Provider: No PCP Per Patient  Chief Complaint: CAP, asthma exacerbation, tobacco abuse, chest pain, AKI   History of Present Illness:This is a 53 y.o. year old male with significant past medical history of tobacco abuse, HTN, asthma  presenting with CAP, asthma exacerbation, tobacco abuse, chest pain, AKI. Pt reports increased WOB, wheezing, cough, pleuritic CP over past 2-3 days. Was seen in ER for sxs 2 days ago. Was placed on course of zpak, but never picked up rx. Has had persistent fever, chills, cough, wheezing since this point. Still smoking daily. + CP. Pleuritic in nature. Worse with coughing and deep breathing.  Presented to ER tmax 102. Resp 20s. BP stable. Satting 96% on RA. WBC 7.6, Hgb 12.6, k 3.0, Cr 1.7. CXR concerning for L perihilar infiltrate.   Assessment and Plan: Ethan Schmidt is a 53 y.o. year old male presenting with CAP, asthma exacerbation, tobacco abuse, chest pain, AKI   Active Problems:   Tobacco abuse   CAP (community acquired pneumonia)   Asthma exacerbation   AKI (acute kidney injury)   1- CAP/Asthma Exacerbation -IVlevaquin -panculture -IV solumedrol  -Duonebs -oxygenating well on RA -supplemental O2 prn  -quit smoking  2- Chest Pain  -highly atypical  -worse with deep breathing -reproducible on chest wall palpation -suspect MSK component  -trop, EKG WNL  -pain control  -check UDS  -cycle CEs -tele bed   3- AKI  -likely prerenal in etiology  -hold HTN meds  -hydrate  -follow   4- HTN -hold HTN meds given above -follow   FEN/GI: replete K. Mag level. Hold offending agents.  Prophylaxis: sub q heparin  Disposition: pending further evaluation  Code Status:Full Code    Patient Active Problem List   Diagnosis Date Noted  . Tobacco abuse 06/29/2014   Past  Medical History: Past Medical History  Diagnosis Date  . Hypertension   . Asthma     Past Surgical History: Past Surgical History  Procedure Laterality Date  . Appendectomy      Social History: History   Social History  . Marital Status: Single    Spouse Name: N/A    Number of Children: N/A  . Years of Education: N/A   Social History Main Topics  . Smoking status: Former Research scientist (life sciences)  . Smokeless tobacco: None  . Alcohol Use: No     Comment: occ  . Drug Use: No  . Sexual Activity: None   Other Topics Concern  . None   Social History Narrative    Family History: History reviewed. No pertinent family history.  Allergies: Allergies  Allergen Reactions  . Omnipaque [Iohexol] Hives    Pt. Did fine with 1 hour pre medication.  . Shellfish Allergy     Current Facility-Administered Medications  Medication Dose Route Frequency Provider Last Rate Last Dose  . 0.9 % NaCl with KCl 40 mEq / L  infusion   Intravenous Continuous Shanda Howells, MD      . heparin injection 5,000 Units  5,000 Units Subcutaneous 3 times per day Shanda Howells, MD      . ipratropium-albuterol (DUONEB) 0.5-2.5 (3) MG/3ML nebulizer solution 3 mL  3 mL Nebulization Q4H Shanda Howells, MD      . ipratropium-albuterol (DUONEB) 0.5-2.5 (3) MG/3ML nebulizer solution 3 mL  3 mL Nebulization Q2H PRN Shanda Howells, MD      .  levofloxacin (LEVAQUIN) IVPB 750 mg  750 mg Intravenous Once Julianne Rice, MD 100 mL/hr at 06/29/14 0337 750 mg at 06/29/14 0337  . methylPREDNISolone sodium succinate (SOLU-MEDROL) 125 mg/2 mL injection 125 mg  125 mg Intravenous Q12H Shanda Howells, MD      . sodium chloride 0.9 % bolus 1,000 mL  1,000 mL Intravenous Once Julianne Rice, MD 1,000 mL/hr at 06/29/14 0337 1,000 mL at 06/29/14 0337  . sodium chloride 0.9 % injection 3 mL  3 mL Intravenous Q12H Shanda Howells, MD       Current Outpatient Prescriptions  Medication Sig Dispense Refill  . azithromycin (ZITHROMAX) 250 MG tablet  1 tablet by mouth daily starting 06/28/2014 4 each 0  . hydrochlorothiazide (HYDRODIURIL) 25 MG tablet Take 25 mg by mouth daily.    Marland Kitchen lisinopril (PRINIVIL,ZESTRIL) 40 MG tablet Take 40 mg by mouth daily.    . ondansetron (ZOFRAN-ODT) 8 MG disintegrating tablet Take 1 tablet (8 mg total) by mouth every 8 (eight) hours as needed for nausea or vomiting. (Patient not taking: Reported on 06/27/2014) 10 tablet 0  . potassium chloride SA (K-DUR,KLOR-CON) 20 MEQ tablet Take 1 tablet (20 mEq total) by mouth daily. 20 tablet 0  . PRESCRIPTION MEDICATION Take 1 tablet by mouth daily. Pt reports taking Rx treating hypertension; unknown drug/strength.    . promethazine (PHENERGAN) 25 MG tablet Take 1 tablet (25 mg total) by mouth every 6 (six) hours as needed for nausea. (Patient not taking: Reported on 06/27/2014) 20 tablet 0   Review Of Systems: 12 point ROS negative except as noted above in HPI.  Physical Exam: Filed Vitals:   06/29/14 0338  BP: 107/76  Pulse: 83  Temp: 102.8 F (39.3 C)  Resp: 22    General: alert and cooperative HEENT: PERRLA and extra ocular movement intact Heart: S1, S2 normal, no murmur, rub or gallop, regular rate and rhythm Lungs: unlabored breathing Abdomen: abdomen is soft without significant tenderness, masses, organomegaly or guarding Extremities: extremities normal, atraumatic, no cyanosis or edema Skin:no rashes, no ecchymoses Neurology: normal without focal findings  Labs and Imaging: Lab Results  Component Value Date/Time   NA 135 06/29/2014 02:13 AM   K 3.0* 06/29/2014 02:13 AM   CL 100 06/29/2014 02:13 AM   CO2 25 06/29/2014 02:13 AM   BUN 16 06/29/2014 02:13 AM   CREATININE 1.71* 06/29/2014 02:13 AM   GLUCOSE 134* 06/29/2014 02:13 AM   Lab Results  Component Value Date   WBC 7.6 06/29/2014   HGB 12.6* 06/29/2014   HCT 37.5* 06/29/2014   MCV 93.1 06/29/2014   PLT 334 06/29/2014    Dg Chest 2 View  06/29/2014   CLINICAL DATA:  Acute onset of  generalized chest pain and shortness of breath. Initial encounter.  EXAM: CHEST  2 VIEW  COMPARISON:  Chest radiograph from 06/27/2014  FINDINGS: The lungs are mildly hypoexpanded. Mild left perihilar airspace opacity raises concern for mild pneumonia, perhaps slightly more prominent than on the prior study. There is no evidence of pleural effusion or pneumothorax.  The heart is borderline normal in size; the mediastinal contour is within normal limits. No acute osseous abnormalities are seen.  IMPRESSION: Lungs mildly hypoexpanded. Mild left perihilar airspace opacity raises concern for mild pneumonia, perhaps slightly more prominent on the prior study.   Electronically Signed   By: Garald Balding M.D.   On: 06/29/2014 03:17   Dg Chest 2 View  06/27/2014   CLINICAL DATA:  Short of  breath.  Cough.  Fever per 2 day  EXAM: CHEST  2 VIEW  COMPARISON:  11/23/2011  FINDINGS: Low volumes. Bibasilar atelectasis versus airspace disease. Upper normal heart size. No pneumothorax. No pleural effusion.  IMPRESSION: Bibasilar atelectasis versus airspace disease.   Electronically Signed   By: Maryclare Bean M.D.   On: 06/27/2014 17:45           Shanda Howells MD  Pager: [phone]

## 2014-06-30 DIAGNOSIS — F17210 Nicotine dependence, cigarettes, uncomplicated: Secondary | ICD-10-CM | POA: Diagnosis present

## 2014-06-30 DIAGNOSIS — N183 Chronic kidney disease, stage 3 (moderate): Secondary | ICD-10-CM | POA: Diagnosis present

## 2014-06-30 DIAGNOSIS — J45901 Unspecified asthma with (acute) exacerbation: Secondary | ICD-10-CM | POA: Diagnosis present

## 2014-06-30 DIAGNOSIS — Z720 Tobacco use: Secondary | ICD-10-CM

## 2014-06-30 DIAGNOSIS — Z79899 Other long term (current) drug therapy: Secondary | ICD-10-CM | POA: Diagnosis not present

## 2014-06-30 DIAGNOSIS — R0602 Shortness of breath: Secondary | ICD-10-CM | POA: Diagnosis present

## 2014-06-30 DIAGNOSIS — N179 Acute kidney failure, unspecified: Secondary | ICD-10-CM | POA: Diagnosis present

## 2014-06-30 DIAGNOSIS — J189 Pneumonia, unspecified organism: Secondary | ICD-10-CM | POA: Diagnosis present

## 2014-06-30 DIAGNOSIS — I129 Hypertensive chronic kidney disease with stage 1 through stage 4 chronic kidney disease, or unspecified chronic kidney disease: Secondary | ICD-10-CM | POA: Diagnosis present

## 2014-06-30 LAB — CBC WITH DIFFERENTIAL/PLATELET
BASOS ABS: 0 10*3/uL (ref 0.0–0.1)
BASOS PCT: 0 % (ref 0–1)
EOS ABS: 0 10*3/uL (ref 0.0–0.7)
Eosinophils Relative: 0 % (ref 0–5)
HCT: 36.5 % — ABNORMAL LOW (ref 39.0–52.0)
Hemoglobin: 12.4 g/dL — ABNORMAL LOW (ref 13.0–17.0)
Lymphocytes Relative: 10 % — ABNORMAL LOW (ref 12–46)
Lymphs Abs: 1.5 10*3/uL (ref 0.7–4.0)
MCH: 31.7 pg (ref 26.0–34.0)
MCHC: 34 g/dL (ref 30.0–36.0)
MCV: 93.4 fL (ref 78.0–100.0)
Monocytes Absolute: 1.1 10*3/uL — ABNORMAL HIGH (ref 0.1–1.0)
Monocytes Relative: 7 % (ref 3–12)
NEUTROS ABS: 11.7 10*3/uL — AB (ref 1.7–7.7)
NEUTROS PCT: 82 % — AB (ref 43–77)
PLATELETS: 397 10*3/uL (ref 150–400)
RBC: 3.91 MIL/uL — ABNORMAL LOW (ref 4.22–5.81)
RDW: 13.6 % (ref 11.5–15.5)
WBC: 14.2 10*3/uL — ABNORMAL HIGH (ref 4.0–10.5)

## 2014-06-30 LAB — COMPREHENSIVE METABOLIC PANEL
ALK PHOS: 69 U/L (ref 39–117)
ALT: 25 U/L (ref 0–53)
AST: 30 U/L (ref 0–37)
Albumin: 3.5 g/dL (ref 3.5–5.2)
Anion gap: 8 (ref 5–15)
BUN: 21 mg/dL (ref 6–23)
CO2: 24 mmol/L (ref 19–32)
Calcium: 8.8 mg/dL (ref 8.4–10.5)
Chloride: 109 mEq/L (ref 96–112)
Creatinine, Ser: 1.34 mg/dL (ref 0.50–1.35)
GFR, EST AFRICAN AMERICAN: 69 mL/min — AB (ref >90)
GFR, EST NON AFRICAN AMERICAN: 60 mL/min — AB (ref >90)
GLUCOSE: 125 mg/dL — AB (ref 70–99)
POTASSIUM: 4.1 mmol/L (ref 3.5–5.1)
SODIUM: 141 mmol/L (ref 135–145)
Total Bilirubin: 0.5 mg/dL (ref 0.3–1.2)
Total Protein: 6.8 g/dL (ref 6.0–8.3)

## 2014-06-30 LAB — HIV ANTIBODY (ROUTINE TESTING W REFLEX): HIV 1&2 Ab, 4th Generation: NONREACTIVE

## 2014-06-30 MED ORDER — SIMETHICONE 80 MG PO CHEW
80.0000 mg | CHEWABLE_TABLET | Freq: Four times a day (QID) | ORAL | Status: DC | PRN
  Administered 2014-06-30: 80 mg via ORAL
  Filled 2014-06-30: qty 1

## 2014-06-30 MED ORDER — DEXTROMETHORPHAN POLISTIREX 30 MG/5ML PO LQCR: 15.0000 mg | Freq: Two times a day (BID) | ORAL | Status: DC

## 2014-06-30 MED ORDER — ALBUTEROL SULFATE HFA 108 (90 BASE) MCG/ACT IN AERS: 2.0000 | INHALATION_SPRAY | Freq: Four times a day (QID) | RESPIRATORY_TRACT | Status: DC | PRN

## 2014-06-30 MED ORDER — LEVOFLOXACIN 500 MG PO TABS: 500.0000 mg | ORAL_TABLET | Freq: Every day | ORAL | Status: DC

## 2014-06-30 MED ORDER — PREDNISONE 20 MG PO TABS: 60.0000 mg | ORAL_TABLET | Freq: Every day | ORAL | Status: DC

## 2014-06-30 NOTE — Progress Notes (Signed)
Patient ambulating in halls this morning.  O2 stats on RA 97-100%.  Pt reports no difficulty with breathing.  Gas pain relieved with ambulation and simethicone.  Tolerating ambulation well.

## 2014-06-30 NOTE — Discharge Summary (Signed)
Physician Discharge Summary  Hero Ainsley [id number] DOB: December 07, 1960 DOA: 06/29/2014  PCP: No PCP Per Patient  PCP in [place]  Admit date: 06/29/2014 Discharge date: 06/30/2014  Time spent: 35 minutes  Recommendations for Outpatient Follow-up:  1. Recommend completion of prednisone burst 2. Recommend completion of Levaquin 3. Recommend outpatient screening for diabetes mellitus 4. Recommend outpatient substance abuse counseling 5. Recommended total tobacco as well as illicit use of discontinuation    Discharge Diagnoses:  Active Problems:   Tobacco abuse   CAP (community acquired pneumonia)   Asthma exacerbation   AKI (acute kidney injury)   Discharge Condition: Good  Diet recommendation: Heart healthy  Filed Weights   06/29/14 0130 06/29/14 0523  Weight: 90.719 kg (200 lb) 103.6 kg (228 lb 6.3 oz)    History of present illness:  53 y/o ? Resident of [place] who came to visit her daughter for granddaughter's birth. Got sick and transferred from the MAU to [facility] ED-port score = 51 so felt good outpatient candidate for. patient given 1 g Rocephin IV and prescription for Zithromax albuterol and told to follow-up with primary care physician Did not pick up antibiotics and return to [facility] ED 12/27, chest x-ray = left perihilar infiltrate, MAXIMUM TEMPERATURE 102, slightly tachycardic Of note his drug screen was positive for cocaine and marijuana and opiates and he had a "Christmas party" with his nephew on arrival to [place]. Patient was admitted to the hospital and kept on IV Levaquin and subsequently this was transitioned to by mouth Levaquin. He was given a steroid burst and it was noted that he had improvement of his symptoms. He still had a dry cough on discharge and therefore was Delsym by mouth to take for suppression. He also was recommended to check quit smoking get screening for diabetes mellitus as an outpatient as he had a slightly elevated sugar  128. He was noted to also have some acute kidney injury secondary over his baseline and he was given IV fluids for transient amount of time and resolved  we also prescribed him an inhaler as he did not have his inhaler here in [place]   Discharge Exam: Filed Vitals:   06/30/14 0551  BP: 130/74  Pulse: 76  Temp: 98.4 F (36.9 C)  Resp: 20    General: Alert pleasant oriented no apparent distress Cardiovascular: S1-S2 no murmur rub or gallop Respiratory: Clinically clear  Discharge Instructions   Discharge Instructions    Diet - low sodium heart healthy    Complete by:  As directed      Discharge instructions    Complete by:  As directed   Quit smoking if you are able.  It will help control your asthma Complete Prednisone Complete levaquin     Increase activity slowly    Complete by:  As directed           Current Discharge Medication List    START taking these medications   Details  albuterol (PROVENTIL HFA;VENTOLIN HFA) 108 (90 BASE) MCG/ACT inhaler Inhale 2 puffs into the lungs every 6 (six) hours as needed for wheezing or shortness of breath. Qty: 1 Inhaler, Refills: 2    dextromethorphan (DELSYM) 30 MG/5ML liquid Take 2.5 mLs (15 mg total) by mouth 2 (two) times daily. Qty: 89 mL, Refills: 0    levofloxacin (LEVAQUIN) 500 MG tablet Take 1 tablet (500 mg total) by mouth daily. Qty: 5 tablet, Refills: 0    predniSONE (DELTASONE) 20 MG  tablet Take 3 tablets (60 mg total) by mouth daily before breakfast. Qty: 15 tablet, Refills: 0      CONTINUE these medications which have NOT CHANGED   Details  Chlorphen-Phenyleph-ASA (ALKA-SELTZER PLUS COLD PO) Take 2 capsules by mouth every 4 (four) hours as needed (cold/congestion).    hydrochlorothiazide (HYDRODIURIL) 25 MG tablet Take 25 mg by mouth daily.    lisinopril (PRINIVIL,ZESTRIL) 40 MG tablet Take 40 mg by mouth daily.    ondansetron (ZOFRAN-ODT) 8 MG disintegrating tablet Take 1 tablet (8 mg total) by  mouth every 8 (eight) hours as needed for nausea or vomiting. Qty: 10 tablet, Refills: 0    promethazine (PHENERGAN) 25 MG tablet Take 1 tablet (25 mg total) by mouth every 6 (six) hours as needed for nausea. Qty: 20 tablet, Refills: 0      STOP taking these medications     ibuprofen (ADVIL,MOTRIN) 200 MG tablet      potassium chloride SA (K-DUR,KLOR-CON) 20 MEQ tablet      azithromycin (ZITHROMAX) 250 MG tablet        Allergies  Allergen Reactions  . Omnipaque [Iohexol] Hives    Pt. Did fine with 1 hour pre medication.  . Shellfish Allergy       The results of significant diagnostics from this hospitalization (including imaging, microbiology, ancillary and laboratory) are listed below for reference.    Significant Diagnostic Studies: Dg Chest 2 View  06/29/2014   CLINICAL DATA:  Acute onset of generalized chest pain and shortness of breath. Initial encounter.  EXAM: CHEST  2 VIEW  COMPARISON:  Chest radiograph from 06/27/2014  FINDINGS: The lungs are mildly hypoexpanded. Mild left perihilar airspace opacity raises concern for mild pneumonia, perhaps slightly more prominent than on the prior study. There is no evidence of pleural effusion or pneumothorax.  The heart is borderline normal in size; the mediastinal contour is within normal limits. No acute osseous abnormalities are seen.  IMPRESSION: Lungs mildly hypoexpanded. Mild left perihilar airspace opacity raises concern for mild pneumonia, perhaps slightly more prominent on the prior study.   Electronically Signed   By: Garald Balding M.D.   On: 06/29/2014 03:17   Dg Chest 2 View  06/27/2014   CLINICAL DATA:  Short of breath.  Cough.  Fever per 2 day  EXAM: CHEST  2 VIEW  COMPARISON:  11/23/2011  FINDINGS: Low volumes. Bibasilar atelectasis versus airspace disease. Upper normal heart size. No pneumothorax. No pleural effusion.  IMPRESSION: Bibasilar atelectasis versus airspace disease.   Electronically Signed   By: Maryclare Bean  M.D.   On: 06/27/2014 17:45    Microbiology: Recent Results (from the past 240 hour(s))  Culture, blood (routine x 2) Call MD if unable to obtain prior to antibiotics being given     Status: None (Preliminary result)   Collection Time: 06/29/14  4:00 AM  Result Value Ref Range Status   Specimen Description BLOOD LEFT ARM  Final   Special Requests BOTTLES DRAWN AEROBIC AND ANAEROBIC 6CC BOTTLES  Final   Culture PENDING  Incomplete   Report Status PENDING  Incomplete  Culture, blood (routine x 2) Call MD if unable to obtain prior to antibiotics being given     Status: None (Preliminary result)   Collection Time: 06/29/14  5:01 AM  Result Value Ref Range Status   Specimen Description BLOOD RIGHT ARM  Final   Special Requests BOTTLES DRAWN AEROBIC AND ANAEROBIC 8CC BOTTLES  Final   Culture PENDING  Incomplete  Report Status PENDING  Incomplete     Labs: Basic Metabolic Panel:  Recent Labs Lab 06/27/14 1611 06/29/14 0213 06/29/14 0450 06/30/14 0555  NA 142 135 136 141  K 3.0* 3.0* 3.1* 4.1  CL 101 100 103 109  CO2  --  25 24 24   GLUCOSE 114* 134* 121* 125*  BUN 8 16 16 21   CREATININE 1.30 1.71* 1.57*  1.56* 1.34  CALCIUM  --  8.7 8.0* 8.8  MG  --   --  1.5  --    Liver Function Tests:  Recent Labs Lab 06/29/14 0213 06/29/14 0450 06/30/14 0555  AST 36 34 30  ALT 27 25 25   ALKPHOS 74 64 69  BILITOT 0.3 0.4 0.5  PROT 7.6 6.8 6.8  ALBUMIN 3.8 3.4* 3.5   No results for input(s): LIPASE, AMYLASE in the last 168 hours. No results for input(s): AMMONIA in the last 168 hours. CBC:  Recent Labs Lab 06/27/14 1611 06/29/14 0213 06/29/14 0450 06/30/14 0555  WBC  --  7.6 DUPLICATE REQUEST  7.7 [id number]*  NEUTROABS  --  6.2 6.5 11.7*  HGB 123456 [id number]* DUPLICATE REQUEST  [id number]* 12.4*  HCT [id number] [id number]* DUPLICATE REQUEST  0000000* 36.5*  MCV  --  [id number] DUPLICATE REQUEST  99991111 93.4  PLT  --  [id number] DUPLICATE REQUEST  0000000 [id number]   Cardiac Enzymes:  Recent Labs Lab 06/29/14 0213  06/29/14 0450 06/29/14 1048 06/29/14 1624  TROPONINI <0.03 <0.03 <0.03 <0.03   BNP: BNP (last 3 results) No results for input(s): PROBNP in the last 8760 hours. CBG: No results for input(s): GLUCAP in the last 168 hours.     SignedNita Sells  [facility] 06/30/2014, 11:42 AM

## 2014-06-30 NOTE — Progress Notes (Signed)
Patient discharged home with family.  IV removed - WNL.  Reviewed meds with patient and stressed importance of completing doses of antibiotic and steroid to prevent further infection.  Verbalizes understanding.  Advised to quit smoking and given education. Instructed to seek medical care if s/s worsen.  No questions at this time, stable to DC home.  Ambulated off unit with RN assist.

## 2014-07-02 LAB — LEGIONELLA ANTIGEN, URINE

## 2014-07-05 LAB — CULTURE, BLOOD (ROUTINE X 2)
CULTURE: NO GROWTH
Culture: NO GROWTH

## 2014-12-26 ENCOUNTER — Emergency Department (HOSPITAL_COMMUNITY): Payer: Medicaid - Out of State

## 2014-12-26 ENCOUNTER — Emergency Department (HOSPITAL_COMMUNITY)
Admission: EM | Admit: 2014-12-26 | Discharge: 2014-12-26 | Disposition: A | Discharge status: Home / Self Care | Payer: Medicaid - Out of State | Attending: Emergency Medicine | Admitting: Emergency Medicine

## 2014-12-26 ENCOUNTER — Encounter (HOSPITAL_COMMUNITY): Payer: Self-pay

## 2014-12-26 DIAGNOSIS — Z79899 Other long term (current) drug therapy: Secondary | ICD-10-CM | POA: Insufficient documentation

## 2014-12-26 DIAGNOSIS — N289 Disorder of kidney and ureter, unspecified: Secondary | ICD-10-CM | POA: Insufficient documentation

## 2014-12-26 DIAGNOSIS — I10 Essential (primary) hypertension: Secondary | ICD-10-CM | POA: Insufficient documentation

## 2014-12-26 DIAGNOSIS — Z87891 Personal history of nicotine dependence: Secondary | ICD-10-CM | POA: Insufficient documentation

## 2014-12-26 DIAGNOSIS — J159 Unspecified bacterial pneumonia: Secondary | ICD-10-CM | POA: Insufficient documentation

## 2014-12-26 DIAGNOSIS — J4521 Mild intermittent asthma with (acute) exacerbation: Secondary | ICD-10-CM | POA: Insufficient documentation

## 2014-12-26 DIAGNOSIS — Z7982 Long term (current) use of aspirin: Secondary | ICD-10-CM | POA: Insufficient documentation

## 2014-12-26 DIAGNOSIS — J189 Pneumonia, unspecified organism: Secondary | ICD-10-CM

## 2014-12-26 DIAGNOSIS — Z7952 Long term (current) use of systemic steroids: Secondary | ICD-10-CM | POA: Insufficient documentation

## 2014-12-26 LAB — COMPREHENSIVE METABOLIC PANEL
ALBUMIN: 4.4 g/dL (ref 3.5–5.0)
ALT: 29 U/L (ref 17–63)
ANION GAP: 15 (ref 5–15)
AST: 35 U/L (ref 15–41)
Alkaline Phosphatase: 79 U/L (ref 38–126)
BUN: 33 mg/dL — AB (ref 6–20)
CO2: 28 mmol/L (ref 22–32)
CREATININE: 2.47 mg/dL — AB (ref 0.61–1.24)
Calcium: 9.5 mg/dL (ref 8.9–10.3)
Chloride: 99 mmol/L — ABNORMAL LOW (ref 101–111)
GFR calc Af Amer: 33 mL/min — ABNORMAL LOW (ref >60)
GFR, EST NON AFRICAN AMERICAN: 29 mL/min — AB (ref >60)
GLUCOSE: 157 mg/dL — AB (ref 65–99)
POTASSIUM: 2.6 mmol/L — AB (ref 3.5–5.1)
Sodium: 142 mmol/L (ref 135–145)
TOTAL PROTEIN: 8 g/dL (ref 6.5–8.1)
Total Bilirubin: 0.5 mg/dL (ref 0.3–1.2)

## 2014-12-26 LAB — CBC
HEMATOCRIT: 40.5 % (ref 39.0–52.0)
Hemoglobin: 14 g/dL (ref 13.0–17.0)
MCH: 32.1 pg (ref 26.0–34.0)
MCHC: 34.6 g/dL (ref 30.0–36.0)
MCV: 92.9 fL (ref 78.0–100.0)
Platelets: 426 10*3/uL — ABNORMAL HIGH (ref 150–400)
RBC: 4.36 MIL/uL (ref 4.22–5.81)
RDW: 13.4 % (ref 11.5–15.5)
WBC: 11.1 10*3/uL — ABNORMAL HIGH (ref 4.0–10.5)

## 2014-12-26 LAB — TROPONIN I

## 2014-12-26 LAB — D-DIMER, QUANTITATIVE: D-Dimer, Quant: 0.6 ug/mL-FEU — ABNORMAL HIGH (ref 0.00–0.48)

## 2014-12-26 MED ORDER — IPRATROPIUM BROMIDE 0.02 % IN SOLN: 0.5000 mg | Freq: Once | RESPIRATORY_TRACT | Status: DC

## 2014-12-26 MED ORDER — LEVOFLOXACIN 500 MG PO TABS: 500.0000 mg | ORAL_TABLET | Freq: Every day | ORAL | Status: DC

## 2014-12-26 MED ORDER — LEVOFLOXACIN 500 MG PO TABS
500.0000 mg | ORAL_TABLET | Freq: Once | ORAL | Status: AC
  Administered 2014-12-26: 500 mg via ORAL
  Filled 2014-12-26: qty 1

## 2014-12-26 MED ORDER — POTASSIUM CHLORIDE CRYS ER 20 MEQ PO TBCR
40.0000 meq | EXTENDED_RELEASE_TABLET | Freq: Once | ORAL | Status: AC
  Administered 2014-12-26: 40 meq via ORAL
  Filled 2014-12-26: qty 2

## 2014-12-26 MED ORDER — LEVOFLOXACIN 250 MG PO TABS: 250.0000 mg | ORAL_TABLET | Freq: Every day | ORAL | Status: DC

## 2014-12-26 MED ORDER — ALBUTEROL SULFATE HFA 108 (90 BASE) MCG/ACT IN AERS
2.0000 | INHALATION_SPRAY | Freq: Once | RESPIRATORY_TRACT | Status: AC
  Administered 2014-12-26: 2 via RESPIRATORY_TRACT
  Filled 2014-12-26: qty 6.7

## 2014-12-26 MED ORDER — PREDNISONE 50 MG PO TABS
60.0000 mg | ORAL_TABLET | Freq: Once | ORAL | Status: AC
  Administered 2014-12-26: 60 mg via ORAL
  Filled 2014-12-26 (×2): qty 1

## 2014-12-26 MED ORDER — IPRATROPIUM-ALBUTEROL 0.5-2.5 (3) MG/3ML IN SOLN
3.0000 mL | Freq: Once | RESPIRATORY_TRACT | Status: AC
  Administered 2014-12-26: 3 mL via RESPIRATORY_TRACT
  Filled 2014-12-26: qty 3

## 2014-12-26 MED ORDER — ALBUTEROL SULFATE (2.5 MG/3ML) 0.083% IN NEBU
2.5000 mg | INHALATION_SOLUTION | Freq: Once | RESPIRATORY_TRACT | Status: AC
  Administered 2014-12-26: 2.5 mg via RESPIRATORY_TRACT
  Filled 2014-12-26: qty 3

## 2014-12-26 MED ORDER — ALBUTEROL SULFATE (2.5 MG/3ML) 0.083% IN NEBU: 5.0000 mg | INHALATION_SOLUTION | Freq: Once | RESPIRATORY_TRACT | Status: DC

## 2014-12-26 NOTE — ED Notes (Signed)
Pt c/o chest pain and sob that started tonight.  

## 2014-12-26 NOTE — Discharge Instructions (Signed)
Asthma, Acute Bronchospasm °Acute bronchospasm caused by asthma is also referred to as an asthma attack. Bronchospasm means your air passages become narrowed. The narrowing is caused by inflammation and tightening of the muscles in the air tubes (bronchi) in your lungs. This can make it hard to breathe or cause you to wheeze and cough. °CAUSES °Possible triggers are: °· Animal dander from the skin, hair, or feathers of animals. °· Dust mites contained in house dust. °· Cockroaches. °· Pollen from trees or grass. °· Mold. °· Cigarette or tobacco smoke. °· Air pollutants such as dust, household cleaners, hair sprays, aerosol sprays, paint fumes, strong chemicals, or strong odors. °· Cold air or weather changes. Cold air may trigger inflammation. Winds increase molds and pollens in the air. °· Strong emotions such as crying or laughing hard. °· Stress. °· Certain medicines such as aspirin or beta-blockers. °· Sulfites in foods and drinks, such as dried fruits and wine. °· Infections or inflammatory conditions, such as a flu, cold, or inflammation of the nasal membranes (rhinitis). °· Gastroesophageal reflux disease (GERD). GERD is a condition where stomach acid backs up into your esophagus. °· Exercise or strenuous activity. °SIGNS AND SYMPTOMS  °· Wheezing. °· Excessive coughing, particularly at night. °· Chest tightness. °· Shortness of breath. °DIAGNOSIS  °Your health care provider will ask you about your medical history and perform a physical exam. A chest X-ray or blood testing may be performed to look for other causes of your symptoms or other conditions that may have triggered your asthma attack.  °TREATMENT  °Treatment is aimed at reducing inflammation and opening up the airways in your lungs.  Most asthma attacks are treated with inhaled medicines. These include quick relief or rescue medicines (such as bronchodilators) and controller medicines (such as inhaled corticosteroids). These medicines are sometimes  given through an inhaler or a nebulizer. Systemic steroid medicine taken by mouth or given through an IV tube also can be used to reduce the inflammation when an attack is moderate or severe. Antibiotic medicines are only used if a bacterial infection is present.  °HOME CARE INSTRUCTIONS  °· Rest. °· Drink plenty of liquids. This helps the mucus to remain thin and be easily coughed up. Only use caffeine in moderation and do not use alcohol until you have recovered from your illness. °· Do not smoke. Avoid being exposed to secondhand smoke. °· You play a critical role in keeping yourself in good health. Avoid exposure to things that cause you to wheeze or to have breathing problems. °· Keep your medicines up-to-date and available. Carefully follow your health care provider's treatment plan. °· Take your medicine exactly as prescribed. °· When pollen or pollution is bad, keep windows closed and use an air conditioner or go to places with air conditioning. °· Asthma requires careful medical care. See your health care provider for a follow-up as advised. If you are more than [redacted] weeks pregnant and you were prescribed any new medicines, let your obstetrician know about the visit and how you are doing. Follow up with your health care provider as directed. °· After you have recovered from your asthma attack, make an appointment with your outpatient doctor to talk about ways to reduce the likelihood of future attacks. If you do not have a doctor who manages your asthma, make an appointment with a primary care doctor to discuss your asthma. °SEEK IMMEDIATE MEDICAL CARE IF:  °· You are getting worse. °· You have trouble breathing. If severe, call your local   emergency services (911 in the [place]  You develop chest pain or discomfort.  You are vomiting.  You are not able to keep fluids down.  You are coughing up yellow, green, brown, or bloody sputum.  You have a fever and your symptoms suddenly get worse.  You have  trouble swallowing. MAKE SURE YOU:   Understand these instructions.  Will watch your condition.  Will get help right away if you are not doing well or get worse. Document Released: 10/05/2006 Document Revised: 06/25/2013 Document Reviewed: 12/26/2012 [facility] Patient Information 2015 [place], [place]. This information is not intended to replace advice given to you by your health care provider. Make sure you discuss any questions you have with your health care provider. Pneumonia Pneumonia is an infection of the lungs.  CAUSES Pneumonia may be caused by bacteria or a virus. Usually, these infections are caused by breathing infectious particles into the lungs (respiratory tract). SIGNS AND SYMPTOMS   Cough.  Fever.  Chest pain.  Increased rate of breathing.  Wheezing.  Mucus production. DIAGNOSIS  If you have the common symptoms of pneumonia, your health care provider will typically confirm the diagnosis with a chest X-ray. The X-ray will show an abnormality in the lung (pulmonary infiltrate) if you have pneumonia. Other tests of your blood, urine, or sputum may be done to find the specific cause of your pneumonia. Your health care provider may also do tests (blood gases or pulse oximetry) to see how well your lungs are working. TREATMENT  Some forms of pneumonia may be spread to other people when you cough or sneeze. You may be asked to wear a mask before and during your exam. Pneumonia that is caused by bacteria is treated with antibiotic medicine. Pneumonia that is caused by the influenza virus may be treated with an antiviral medicine. Most other viral infections must run their course. These infections will not respond to antibiotics.  HOME CARE INSTRUCTIONS   Cough suppressants may be used if you are losing too much rest. However, coughing protects you by clearing your lungs. You should avoid using cough suppressants if you can.  Your health care provider may have prescribed  medicine if he or she thinks your pneumonia is caused by bacteria or influenza. Finish your medicine even if you start to feel better.  Your health care provider may also prescribe an expectorant. This loosens the mucus to be coughed up.  Take medicines only as directed by your health care provider.  Do not smoke. Smoking is a common cause of bronchitis and can contribute to pneumonia. If you are a smoker and continue to smoke, your cough may last several weeks after your pneumonia has cleared.  A cold steam vaporizer or humidifier in your room or home may help loosen mucus.  Coughing is often worse at night. Sleeping in a semi-upright position in a recliner or using a couple pillows under your head will help with this.  Get rest as you feel it is needed. Your body will usually let you know when you need to rest. PREVENTION A pneumococcal shot (vaccine) is available to prevent a common bacterial cause of pneumonia. This is usually suggested for:  People over 42 years old.  Patients on chemotherapy.  People with chronic lung problems, such as bronchitis or emphysema.  People with immune system problems. If you are over 65 or have a high risk condition, you may receive the pneumococcal vaccine if you have not received it before. In some countries,  a routine influenza vaccine is also recommended. This vaccine can help prevent some cases of pneumonia.You may be offered the influenza vaccine as part of your care. If you smoke, it is time to quit. You may receive instructions on how to stop smoking. Your health care provider can provide medicines and counseling to help you quit. SEEK MEDICAL CARE IF: You have a fever. SEEK IMMEDIATE MEDICAL CARE IF:   Your illness becomes worse. This is especially true if you are elderly or weakened from any other disease.  You cannot control your cough with suppressants and are losing sleep.  You begin coughing up blood.  You develop pain which is  getting worse or is uncontrolled with medicines.  Any of the symptoms which initially brought you in for treatment are getting worse rather than better.  You develop shortness of breath or chest pain. MAKE SURE YOU:   Understand these instructions.  Will watch your condition.  Will get help right away if you are not doing well or get worse. Document Released: 06/20/2005 Document Revised: 11/04/2013 Document Reviewed: 09/09/2010 [facility] Patient Information 2015 [place], [place]. This information is not intended to replace advice given to you by your health care provider. Make sure you discuss any questions you have with your health care provider.

## 2014-12-26 NOTE — ED Notes (Signed)
CRITICAL VALUE ALERT  Critical value received:  Potassium 2.6  Date of notification:  12/26/14  Time of notification:  0422  Critical value read back:Yes.    Nurse who received alert:  Toma Deiters  MD notified (1st page):  Dr Venora Maples  Time of first page:  0422  MD notified (2nd page):  Time of second page:  Responding MD:  Dr Venora Maples  Time MD responded:  [phone]

## 2014-12-26 NOTE — ED Provider Notes (Signed)
CSN: [id number]     Arrival date & time 12/26/14  [id number] History   First MD Initiated Contact with Patient 12/26/14 0327     Chief Complaint  Patient presents with  . Chest Pain      The history is provided by the patient and medical records.   patient has a history of asthma and is currently out of his albuterol MDI.  He reports increasing shortness of breath and chest discomfort which began this evening.  No unilateral leg swelling.  No history of DVT or pulmonary embolism.  No history of ACS.  Reports chills without documented fever.  Symptoms are mild to moderate in severity.  Past Medical History  Diagnosis Date  . Hypertension   . Asthma    Past Surgical History  Procedure Laterality Date  . Appendectomy     No family history on file. History  Substance Use Topics  . Smoking status: Former Smoker -- 0.25 packs/day  . Smokeless tobacco: Current User  . Alcohol Use: No     Comment: occ    Review of Systems  All other systems reviewed and are negative.     Allergies  Omnipaque and Shellfish allergy  Home Medications   Prior to Admission medications   Medication Sig Start Date End Date Taking? Authorizing Provider  albuterol (PROVENTIL HFA;VENTOLIN HFA) 108 (90 BASE) MCG/ACT inhaler Inhale 2 puffs into the lungs every 6 (six) hours as needed for wheezing or shortness of breath. 06/30/14  Yes Nita Sells, MD  amLODipine (NORVASC) 10 MG tablet Take 10 mg by mouth daily.   Yes Historical Provider, MD  aspirin 81 MG tablet Take 81 mg by mouth daily.   Yes Historical Provider, MD  hydrochlorothiazide (HYDRODIURIL) 25 MG tablet Take 25 mg by mouth daily.   Yes Historical Provider, MD  lisinopril (PRINIVIL,ZESTRIL) 40 MG tablet Take 40 mg by mouth daily.   Yes Historical Provider, MD  Chlorphen-Phenyleph-ASA (ALKA-SELTZER PLUS COLD PO) Take 2 capsules by mouth every 4 (four) hours as needed (cold/congestion).    Historical Provider, MD  dextromethorphan (DELSYM) 30  MG/5ML liquid Take 2.5 mLs (15 mg total) by mouth 2 (two) times daily. 06/30/14   Nita Sells, MD  levofloxacin (LEVAQUIN) 500 MG tablet Take 1 tablet (500 mg total) by mouth daily. 06/30/14   Nita Sells, MD  ondansetron (ZOFRAN-ODT) 8 MG disintegrating tablet Take 1 tablet (8 mg total) by mouth every 8 (eight) hours as needed for nausea or vomiting. Patient not taking: Reported on 06/27/2014 09/04/13   Davonna Belling, MD  predniSONE (DELTASONE) 20 MG tablet Take 3 tablets (60 mg total) by mouth daily before breakfast. 06/30/14   Nita Sells, MD  promethazine (PHENERGAN) 25 MG tablet Take 1 tablet (25 mg total) by mouth every 6 (six) hours as needed for nausea. Patient not taking: Reported on 06/27/2014 09/04/13   Davonna Belling, MD   BP 123/90 mmHg  Pulse 73  Temp(Src) 97.8 F (36.6 C) (Oral)  Resp 20  Ht 6\' 1"  (1.854 m)  Wt 240 lb (108.863 kg)  BMI 31.67 kg/m2  SpO2 94% Physical Exam  Constitutional: He is oriented to person, place, and time. He appears well-developed and well-nourished.  HENT:  Head: Normocephalic and atraumatic.  Eyes: EOM are normal.  Neck: Normal range of motion.  Cardiovascular: Normal rate, regular rhythm, normal heart sounds and intact distal pulses.   Pulmonary/Chest: Effort normal. No respiratory distress. He has wheezes.  Abdominal: Soft. He exhibits no distension. There is  no tenderness.  Musculoskeletal: Normal range of motion.  Neurological: He is alert and oriented to person, place, and time.  Skin: Skin is warm and dry.  Psychiatric: He has a normal mood and affect. Judgment normal.  Nursing note and vitals reviewed.   ED Course  Procedures (including critical care time) Labs Review Labs Reviewed  CBC - Abnormal; Notable for the following:    WBC 11.1 (*)    Platelets 426 (*)    All other components within normal limits  COMPREHENSIVE METABOLIC PANEL  TROPONIN I  D-DIMER, QUANTITATIVE (NOT AT [facility])    Imaging  Review Dg Chest 2 View  12/26/2014   CLINICAL DATA:  Acute onset of generalized chest pain and shortness of breath. Initial encounter.  EXAM: CHEST  2 VIEW  COMPARISON:  Chest radiograph performed 06/29/2014  FINDINGS: The lungs are mildly hypoexpanded. Mild left basilar airspace opacity raises concern for mild pneumonia, given the patient's symptoms. There is no evidence of pleural effusion or pneumothorax.  The heart is normal in size; the mediastinal contour is within normal limits. No acute osseous abnormalities are seen.  IMPRESSION: Mild left basilar airspace opacity raises concern for mild pneumonia, given the patient's symptoms. Lungs mildly hypoexpanded.   Electronically Signed   By: Garald Balding M.D.   On: 12/26/2014 04:03  I personally reviewed the imaging tests through PACS system I reviewed available ER/hospitalization records through the EMR    EKG Interpretation   Date/Time:  Friday December 26 2014 03:23:06 EDT Ventricular Rate:  75 PR Interval:  123 QRS Duration: 90 QT Interval:  479 QTC Calculation: 535 R Axis:   39 Text Interpretation:  Sinus rhythm Probable left atrial enlargement  Borderline repolarization abnormality Prolonged QT interval nonspecific st  and T wave changes Confirmed by Rito Lecomte  MD, Jeannette Maddy ([id number]) on 12/26/2014  3:27:54 AM      MDM   Final diagnoses:  Community acquired pneumonia  Asthma, mild intermittent, with acute exacerbation  Renal insufficiency    Patient feels much better after albuterol.  Patient with pneumonia on x-ray.  Clinically this is consistent with pneumonia and asthma exacerbation with proximal spasm.  D-dimer is mildly elevated at 0.6.  Doubt pulmonary embolism.  Low pretest probability of pulmonary embolism   Jola Schmidt, MD 12/26/14 [id number]

## 2015-04-20 ENCOUNTER — Emergency Department (HOSPITAL_COMMUNITY): Payer: Medicaid - Out of State

## 2015-04-20 ENCOUNTER — Emergency Department (HOSPITAL_COMMUNITY)
Admission: EM | Admit: 2015-04-20 | Discharge: 2015-04-20 | Disposition: A | Discharge status: Home / Self Care | Payer: Medicaid - Out of State | Attending: Emergency Medicine | Admitting: Emergency Medicine

## 2015-04-20 ENCOUNTER — Encounter (HOSPITAL_COMMUNITY): Payer: Self-pay | Admitting: *Deleted

## 2015-04-20 DIAGNOSIS — Z792 Long term (current) use of antibiotics: Secondary | ICD-10-CM | POA: Diagnosis not present

## 2015-04-20 DIAGNOSIS — Z7982 Long term (current) use of aspirin: Secondary | ICD-10-CM | POA: Diagnosis not present

## 2015-04-20 DIAGNOSIS — R109 Unspecified abdominal pain: Secondary | ICD-10-CM

## 2015-04-20 DIAGNOSIS — Z87891 Personal history of nicotine dependence: Secondary | ICD-10-CM | POA: Diagnosis not present

## 2015-04-20 DIAGNOSIS — E860 Dehydration: Secondary | ICD-10-CM | POA: Diagnosis not present

## 2015-04-20 DIAGNOSIS — R112 Nausea with vomiting, unspecified: Secondary | ICD-10-CM | POA: Diagnosis present

## 2015-04-20 DIAGNOSIS — E876 Hypokalemia: Secondary | ICD-10-CM | POA: Diagnosis not present

## 2015-04-20 DIAGNOSIS — K5732 Diverticulitis of large intestine without perforation or abscess without bleeding: Secondary | ICD-10-CM | POA: Insufficient documentation

## 2015-04-20 DIAGNOSIS — I10 Essential (primary) hypertension: Secondary | ICD-10-CM | POA: Insufficient documentation

## 2015-04-20 DIAGNOSIS — Z9049 Acquired absence of other specified parts of digestive tract: Secondary | ICD-10-CM | POA: Insufficient documentation

## 2015-04-20 DIAGNOSIS — Z7952 Long term (current) use of systemic steroids: Secondary | ICD-10-CM | POA: Diagnosis not present

## 2015-04-20 DIAGNOSIS — Z79899 Other long term (current) drug therapy: Secondary | ICD-10-CM | POA: Insufficient documentation

## 2015-04-20 DIAGNOSIS — R197 Diarrhea, unspecified: Secondary | ICD-10-CM

## 2015-04-20 DIAGNOSIS — K5792 Diverticulitis of intestine, part unspecified, without perforation or abscess without bleeding: Secondary | ICD-10-CM

## 2015-04-20 DIAGNOSIS — J45909 Unspecified asthma, uncomplicated: Secondary | ICD-10-CM | POA: Insufficient documentation

## 2015-04-20 LAB — COMPREHENSIVE METABOLIC PANEL
ALBUMIN: 3.9 g/dL (ref 3.5–5.0)
ALT: 24 U/L (ref 17–63)
ANION GAP: 9 (ref 5–15)
AST: 32 U/L (ref 15–41)
Alkaline Phosphatase: 77 U/L (ref 38–126)
BUN: 15 mg/dL (ref 6–20)
CALCIUM: 8.8 mg/dL — AB (ref 8.9–10.3)
CO2: 28 mmol/L (ref 22–32)
Chloride: 103 mmol/L (ref 101–111)
Creatinine, Ser: 1.41 mg/dL — ABNORMAL HIGH (ref 0.61–1.24)
GFR calc non Af Amer: 56 mL/min — ABNORMAL LOW (ref >60)
GLUCOSE: 148 mg/dL — AB (ref 65–99)
POTASSIUM: 3 mmol/L — AB (ref 3.5–5.1)
SODIUM: 140 mmol/L (ref 135–145)
Total Bilirubin: 0.6 mg/dL (ref 0.3–1.2)
Total Protein: 7.1 g/dL (ref 6.5–8.1)

## 2015-04-20 LAB — CBC WITH DIFFERENTIAL/PLATELET
BASOS PCT: 0 %
Basophils Absolute: 0 10*3/uL (ref 0.0–0.1)
EOS ABS: 0.1 10*3/uL (ref 0.0–0.7)
Eosinophils Relative: 1 %
HCT: 39.1 % (ref 39.0–52.0)
HEMOGLOBIN: 13.1 g/dL (ref 13.0–17.0)
LYMPHS ABS: 3.1 10*3/uL (ref 0.7–4.0)
Lymphocytes Relative: 34 %
MCH: 31.1 pg (ref 26.0–34.0)
MCHC: 33.5 g/dL (ref 30.0–36.0)
MCV: 92.9 fL (ref 78.0–100.0)
MONOS PCT: 6 %
Monocytes Absolute: 0.5 10*3/uL (ref 0.1–1.0)
NEUTROS ABS: 5.3 10*3/uL (ref 1.7–7.7)
Neutrophils Relative %: 59 %
Platelets: 395 10*3/uL (ref 150–400)
RBC: 4.21 MIL/uL — AB (ref 4.22–5.81)
RDW: 13.1 % (ref 11.5–15.5)
WBC: 9 10*3/uL (ref 4.0–10.5)

## 2015-04-20 LAB — LIPASE, BLOOD: Lipase: 17 U/L — ABNORMAL LOW (ref 22–51)

## 2015-04-20 MED ORDER — POTASSIUM CHLORIDE 10 MEQ/100ML IV SOLN
10.0000 meq | INTRAVENOUS | Status: DC
  Administered 2015-04-20 (×3): 10 meq via INTRAVENOUS
  Filled 2015-04-20 (×3): qty 100

## 2015-04-20 MED ORDER — FENTANYL CITRATE (PF) 100 MCG/2ML IJ SOLN
50.0000 ug | Freq: Once | INTRAMUSCULAR | Status: AC
  Administered 2015-04-20: 50 ug via INTRAVENOUS
  Filled 2015-04-20: qty 2

## 2015-04-20 MED ORDER — CIPROFLOXACIN HCL 500 MG PO TABS: 500.0000 mg | ORAL_TABLET | Freq: Two times a day (BID) | ORAL | Status: DC

## 2015-04-20 MED ORDER — METOCLOPRAMIDE HCL 5 MG/ML IJ SOLN
10.0000 mg | Freq: Once | INTRAMUSCULAR | Status: AC
  Administered 2015-04-20: 10 mg via INTRAVENOUS
  Filled 2015-04-20: qty 2

## 2015-04-20 MED ORDER — SODIUM CHLORIDE 0.9 % IV SOLN
1000.0000 mL | INTRAVENOUS | Status: DC
  Administered 2015-04-20: 1000 mL via INTRAVENOUS

## 2015-04-20 MED ORDER — METRONIDAZOLE 500 MG PO TABS: 500.0000 mg | ORAL_TABLET | Freq: Three times a day (TID) | ORAL | Status: DC

## 2015-04-20 MED ORDER — PROMETHAZINE HCL 25 MG/ML IJ SOLN
12.5000 mg | Freq: Once | INTRAMUSCULAR | Status: AC
  Administered 2015-04-20: 12.5 mg via INTRAVENOUS
  Filled 2015-04-20: qty 1

## 2015-04-20 MED ORDER — SODIUM CHLORIDE 0.9 % IV SOLN
1000.0000 mL | Freq: Once | INTRAVENOUS | Status: AC
  Administered 2015-04-20: 1000 mL via INTRAVENOUS

## 2015-04-20 MED ORDER — METRONIDAZOLE IN NACL 5-0.79 MG/ML-% IV SOLN
500.0000 mg | Freq: Once | INTRAVENOUS | Status: AC
  Administered 2015-04-20: 500 mg via INTRAVENOUS
  Filled 2015-04-20: qty 100

## 2015-04-20 MED ORDER — CIPROFLOXACIN IN D5W 400 MG/200ML IV SOLN
400.0000 mg | Freq: Once | INTRAVENOUS | Status: AC
  Administered 2015-04-20: 400 mg via INTRAVENOUS
  Filled 2015-04-20: qty 200

## 2015-04-20 MED ORDER — ONDANSETRON 4 MG PO TBDP: 4.0000 mg | ORAL_TABLET | Freq: Three times a day (TID) | ORAL | Status: DC | PRN

## 2015-04-20 MED ORDER — DIPHENHYDRAMINE HCL 50 MG/ML IJ SOLN
25.0000 mg | Freq: Once | INTRAMUSCULAR | Status: AC
  Administered 2015-04-20: 25 mg via INTRAVENOUS
  Filled 2015-04-20: qty 1

## 2015-04-20 NOTE — ED Notes (Signed)
Pt report vomiting & diarrhea that started around 0100.

## 2015-04-20 NOTE — Discharge Instructions (Signed)
Diverticulitis °Diverticulitis is inflammation or infection of small pouches in your colon that form when you have a condition called diverticulosis. The pouches in your colon are called diverticula. Your colon, or large intestine, is where water is absorbed and stool is formed. °Complications of diverticulitis can include: °· Bleeding. °· Severe infection. °· Severe pain. °· Perforation of your colon. °· Obstruction of your colon. °CAUSES  °Diverticulitis is caused by bacteria. °Diverticulitis happens when stool becomes trapped in diverticula. This allows bacteria to grow in the diverticula, which can lead to inflammation and infection. °RISK FACTORS °People with diverticulosis are at risk for diverticulitis. Eating a diet that does not include enough fiber from fruits and vegetables may make diverticulitis more likely to develop. °SYMPTOMS  °Symptoms of diverticulitis may include: °· Abdominal pain and tenderness. The pain is normally located on the left side of the abdomen, but may occur in other areas. °· Fever and chills. °· Bloating. °· Cramping. °· Nausea. °· Vomiting. °· Constipation. °· Diarrhea. °· Blood in your stool. °DIAGNOSIS  °Your health care provider will ask you about your medical history and do a physical exam. You may need to have tests done because many medical conditions can cause the same symptoms as diverticulitis. Tests may include: °· Blood tests. °· Urine tests. °· Imaging tests of the abdomen, including X-rays and CT scans. °When your condition is under control, your health care provider may recommend that you have a colonoscopy. A colonoscopy can show how severe your diverticula are and whether something else is causing your symptoms. °TREATMENT  °Most cases of diverticulitis are mild and can be treated at home. Treatment may include: °· Taking over-the-counter pain medicines. °· Following a clear liquid diet. °· Taking antibiotic medicines by mouth for 7-10 days. °More severe cases may  be treated at a hospital. Treatment may include: °· Not eating or drinking. °· Taking prescription pain medicine. °· Receiving antibiotic medicines through an IV tube. °· Receiving fluids and nutrition through an IV tube. °· Surgery. °HOME CARE INSTRUCTIONS  °· Follow your health care provider's instructions carefully. °· Follow a full liquid diet or other diet as directed by your health care provider. After your symptoms improve, your health care provider may tell you to change your diet. He or she may recommend you eat a high-fiber diet. Fruits and vegetables are good sources of fiber. Fiber makes it easier to pass stool. °· Take fiber supplements or probiotics as directed by your health care provider. °· Only take medicines as directed by your health care provider. °· Keep all your follow-up appointments. °SEEK MEDICAL CARE IF:  °· Your pain does not improve. °· You have a hard time eating food. °· Your bowel movements do not return to normal. °SEEK IMMEDIATE MEDICAL CARE IF:  °· Your pain becomes worse. °· Your symptoms do not get better. °· Your symptoms suddenly get worse. °· You have a fever. °· You have repeated vomiting. °· You have bloody or black, tarry stools. °MAKE SURE YOU:  °· Understand these instructions. °· Will watch your condition. °· Will get help right away if you are not doing well or get worse. °  °This information is not intended to replace advice given to you by your health care provider. Make sure you discuss any questions you have with your health care provider. °  °Document Released: 03/30/2005 Document Revised: 06/25/2013 Document Reviewed: 05/15/2013 °Elsevier Interactive Patient Education ©2016 [employer] ° °

## 2015-04-20 NOTE — ED Provider Notes (Signed)
CSN: [id number]     Arrival date & time 04/20/15  [id number] History   First MD Initiated Contact with Patient 04/20/15 0450     Chief Complaint  Patient presents with  . Emesis     (Consider location/radiation/quality/duration/timing/severity/associated sxs/prior Treatment) HPI patient reports he was fine all day yesterday, October 16. He started getting some nausea and went to bed. He reports he was awakened at 1 AM and has had multiple episodes of vomiting and diarrhea. He states he's had 7-8 episodes of each. He states the diarrhea is now watery. He denies any blood in either. He complains of central abdominal discomfort that is sharp and constant. He denies any fever. He denies being around anybody else is sick. He denies eating anything out of the ordinary or different. He states he had something similar a reviewing his chart it was in March 2015. He was treated in the ED and released. Patient has tried Pepto-Bismol and milk of magnesia however he had vomiting after taking each.  PCP in [place]  Past Medical History  Diagnosis Date  . Hypertension   . Asthma    Past Surgical History  Procedure Laterality Date  . Appendectomy     No family history on file. Social History  Substance Use Topics  . Smoking status: Former Smoker -- 0.25 packs/day  . Smokeless tobacco: Current User  . Alcohol Use: No     Comment: occ   employed in [employer] Lives at home Lives with spouse  Review of Systems  All other systems reviewed and are negative.     Allergies  Omnipaque and Shellfish allergy  Home Medications   Prior to Admission medications   Medication Sig Start Date End Date Taking? Authorizing Provider  amLODipine (NORVASC) 10 MG tablet Take 10 mg by mouth daily.   Yes Historical Provider, MD  aspirin 81 MG tablet Take 81 mg by mouth daily.   Yes Historical Provider, MD  Chlorphen-Phenyleph-ASA (ALKA-SELTZER PLUS COLD PO) Take 2 capsules by mouth every 4 (four) hours as  needed (cold/congestion).   Yes Historical Provider, MD  hydrochlorothiazide (HYDRODIURIL) 25 MG tablet Take 25 mg by mouth daily.   Yes Historical Provider, MD  lisinopril (PRINIVIL,ZESTRIL) 40 MG tablet Take 40 mg by mouth daily.   Yes Historical Provider, MD  albuterol (PROVENTIL HFA;VENTOLIN HFA) 108 (90 BASE) MCG/ACT inhaler Inhale 2 puffs into the lungs every 6 (six) hours as needed for wheezing or shortness of breath. 06/30/14   Nita Sells, MD  dextromethorphan (DELSYM) 30 MG/5ML liquid Take 2.5 mLs (15 mg total) by mouth 2 (two) times daily. 06/30/14   Nita Sells, MD  levofloxacin (LEVAQUIN) 250 MG tablet Take 1 tablet (250 mg total) by mouth daily. 12/26/14   Jola Schmidt, MD  ondansetron (ZOFRAN-ODT) 8 MG disintegrating tablet Take 1 tablet (8 mg total) by mouth every 8 (eight) hours as needed for nausea or vomiting. Patient not taking: Reported on 06/27/2014 09/04/13   Davonna Belling, MD  predniSONE (DELTASONE) 20 MG tablet Take 3 tablets (60 mg total) by mouth daily before breakfast. 06/30/14   Nita Sells, MD  promethazine (PHENERGAN) 25 MG tablet Take 1 tablet (25 mg total) by mouth every 6 (six) hours as needed for nausea. Patient not taking: Reported on 06/27/2014 09/04/13   Davonna Belling, MD   BP 157/98 mmHg  Pulse 82  Temp(Src) 97.5 F (36.4 C) (Oral)  Resp 20  Ht 6\' 1"  (1.854 m)  Wt 240 lb (108.863 kg)  BMI 31.67 kg/m2  SpO2 95%  Vital signs normal   Physical Exam  Constitutional: He is oriented to person, place, and time. He appears well-developed and well-nourished.  Non-toxic appearance. He does not appear ill. No distress.  HENT:  Head: Normocephalic and atraumatic.  Right Ear: External ear normal.  Left Ear: External ear normal.  Nose: Nose normal. No mucosal edema or rhinorrhea.  Mouth/Throat: Mucous membranes are normal. No dental abscesses or uvula swelling.  Tongue is dry with black coating, patient states he took Pepto-Bismol at  home  Eyes: Conjunctivae and EOM are normal. Pupils are equal, round, and reactive to light.  Neck: Normal range of motion and full passive range of motion without pain. Neck supple.  Cardiovascular: Normal rate, regular rhythm and normal heart sounds.  Exam reveals no gallop and no friction rub.   No murmur heard. Pulmonary/Chest: Effort normal and breath sounds normal. No respiratory distress. He has no wheezes. He has no rhonchi. He has no rales. He exhibits no tenderness and no crepitus.  Abdominal: Soft. Normal appearance and bowel sounds are normal. He exhibits no distension. There is generalized tenderness. There is no rebound and no guarding.    Patient is tender diffusely everywhere to palpation. He states he is having soreness when his abdomen is palpated.  Musculoskeletal: Normal range of motion. He exhibits no edema or tenderness.  Moves all extremities well.   Neurological: He is alert and oriented to person, place, and time. He has normal strength. No cranial nerve deficit.  Skin: Skin is warm, dry and intact. No rash noted. No erythema. No pallor.  Psychiatric: He has a normal mood and affect. His speech is normal and behavior is normal. His mood appears not anxious.  Nursing note and vitals reviewed.   ED Course  Procedures (including critical care time)  Medications  0.9 %  sodium chloride infusion (1,000 mLs Intravenous New Bag/Given 04/20/15 0504)    Followed by  0.9 %  sodium chloride infusion (0 mLs Intravenous Stopped 04/20/15 0601)    Followed by  0.9 %  sodium chloride infusion (1,000 mLs Intravenous New Bag/Given 04/20/15 0604)  fentaNYL (SUBLIMAZE) injection 50 mcg (not administered)  potassium chloride 10 mEq in 100 mL IVPB (not administered)  metoCLOPramide (REGLAN) injection 10 mg (10 mg Intravenous Given 04/20/15 0508)  diphenhydrAMINE (BENADRYL) injection 25 mg (25 mg Intravenous Given 04/20/15 0504)  promethazine (PHENERGAN) injection 12.5 mg (12.5 mg  Intravenous Given 04/20/15 [id number])    Review of prior test shows patient had a abdominal/pelvis CT scan done in 2011 which showed acute pancreatitis. He had a ultrasound done the following day which showed no gallstones.  Patient was rechecked at 6:00 AM. He states he still has nausea and some abdominal discomfort. He was given IV Phenergan.  Patient was rechecked at 6:40 AM he states he still is having abdominal pain. At this point he is not responding to the usual GI medications. CT scan was ordered and he was given fentanyl for pain. Since he is not going to be discharged soon he was started on IV potassium for his hypokalemia.  Pt turned over at change of shift to Dr Alfonse Spruce to get his CT results.   Labs Review Results for orders placed or performed during the hospital encounter of 04/20/15  Comprehensive metabolic panel  Result Value Ref Range   Sodium 140 135 - 145 mmol/L   Potassium 3.0 (L) 3.5 - 5.1 mmol/L   Chloride 103 101 - 111  mmol/L   CO2 28 22 - 32 mmol/L   Glucose, Bld 148 (H) 65 - 99 mg/dL   BUN 15 6 - 20 mg/dL   Creatinine, Ser 1.41 (H) 0.61 - 1.24 mg/dL   Calcium 8.8 (L) 8.9 - 10.3 mg/dL   Total Protein 7.1 6.5 - 8.1 g/dL   Albumin 3.9 3.5 - 5.0 g/dL   AST 32 15 - 41 U/L   ALT 24 17 - 63 U/L   Alkaline Phosphatase 77 38 - 126 U/L   Total Bilirubin 0.6 0.3 - 1.2 mg/dL   GFR calc non Af Amer 56 (L) >60 mL/min   GFR calc Af Amer >60 >60 mL/min   Anion gap 9 5 - 15  Lipase, blood  Result Value Ref Range   Lipase 17 (L) 22 - 51 U/L  CBC with Differential  Result Value Ref Range   WBC 9.0 4.0 - 10.5 K/uL   RBC 4.21 (L) 4.22 - 5.81 MIL/uL   Hemoglobin 13.1 13.0 - 17.0 g/dL   HCT 39.1 39.0 - 52.0 %   MCV 92.9 78.0 - 100.0 fL   MCH 31.1 26.0 - 34.0 pg   MCHC 33.5 30.0 - 36.0 g/dL   RDW 13.1 11.5 - 15.5 %   Platelets 395 150 - 400 K/uL   Neutrophils Relative % 59 %   Neutro Abs 5.3 1.7 - 7.7 K/uL   Lymphocytes Relative 34 %   Lymphs Abs 3.1 0.7 - 4.0 K/uL    Monocytes Relative 6 %   Monocytes Absolute 0.5 0.1 - 1.0 K/uL   Eosinophils Relative 1 %   Eosinophils Absolute 0.1 0.0 - 0.7 K/uL   Basophils Relative 0 %   Basophils Absolute 0.0 0.0 - 0.1 K/uL   Laboratory interpretation all normal except hypokalemia     Imaging Review No results found. I have personally reviewed and evaluated these images and lab results as part of my medical decision-making.   EKG Interpretation None      MDM   Final diagnoses:  Nausea vomiting and diarrhea  Dehydration  Hypokalemia    Disposition pending  Rolland Porter, MD, Barbette Or, MD 04/20/15 [id number]

## 2015-12-08 ENCOUNTER — Emergency Department (HOSPITAL_COMMUNITY): Payer: Self-pay

## 2015-12-08 ENCOUNTER — Encounter (HOSPITAL_COMMUNITY): Payer: Self-pay | Admitting: Emergency Medicine

## 2015-12-08 ENCOUNTER — Emergency Department (HOSPITAL_COMMUNITY)
Admission: EM | Admit: 2015-12-08 | Discharge: 2015-12-09 | Disposition: A | Discharge status: Home / Self Care | Payer: Self-pay | Attending: Emergency Medicine | Admitting: Emergency Medicine

## 2015-12-08 DIAGNOSIS — Z87891 Personal history of nicotine dependence: Secondary | ICD-10-CM | POA: Insufficient documentation

## 2015-12-08 DIAGNOSIS — Z79899 Other long term (current) drug therapy: Secondary | ICD-10-CM | POA: Insufficient documentation

## 2015-12-08 DIAGNOSIS — E876 Hypokalemia: Secondary | ICD-10-CM | POA: Insufficient documentation

## 2015-12-08 DIAGNOSIS — R05 Cough: Secondary | ICD-10-CM | POA: Insufficient documentation

## 2015-12-08 DIAGNOSIS — I10 Essential (primary) hypertension: Secondary | ICD-10-CM | POA: Insufficient documentation

## 2015-12-08 DIAGNOSIS — J45909 Unspecified asthma, uncomplicated: Secondary | ICD-10-CM | POA: Insufficient documentation

## 2015-12-08 LAB — I-STAT CHEM 8, ED
BUN: 9 mg/dL (ref 6–20)
CHLORIDE: 101 mmol/L (ref 101–111)
CREATININE: 1.2 mg/dL (ref 0.61–1.24)
Calcium, Ion: 1.12 mmol/L (ref 1.12–1.23)
GLUCOSE: 83 mg/dL (ref 65–99)
HCT: 39 % (ref 39.0–52.0)
HEMOGLOBIN: 13.3 g/dL (ref 13.0–17.0)
POTASSIUM: 2.8 mmol/L — AB (ref 3.5–5.1)
Sodium: 143 mmol/L (ref 135–145)
TCO2: 27 mmol/L (ref 0–100)

## 2015-12-08 MED ORDER — POTASSIUM CHLORIDE CRYS ER 20 MEQ PO TBCR: 20.0000 meq | EXTENDED_RELEASE_TABLET | Freq: Every day | ORAL | Status: DC

## 2015-12-08 MED ORDER — ONDANSETRON HCL 4 MG/2ML IJ SOLN
4.0000 mg | Freq: Once | INTRAMUSCULAR | Status: AC
  Administered 2015-12-08: 4 mg via INTRAVENOUS
  Filled 2015-12-08: qty 2

## 2015-12-08 MED ORDER — HYDROMORPHONE HCL 1 MG/ML IJ SOLN
0.5000 mg | Freq: Once | INTRAMUSCULAR | Status: AC
  Administered 2015-12-08: 0.5 mg via INTRAVENOUS
  Filled 2015-12-08: qty 1

## 2015-12-08 MED ORDER — POTASSIUM CHLORIDE CRYS ER 20 MEQ PO TBCR
40.0000 meq | EXTENDED_RELEASE_TABLET | Freq: Once | ORAL | Status: AC
  Administered 2015-12-09: 40 meq via ORAL
  Filled 2015-12-08: qty 2

## 2015-12-08 NOTE — Discharge Instructions (Signed)
Follow up with your md next week. °

## 2015-12-08 NOTE — ED Notes (Signed)
Pt states he has been having fatigue since he ran out of three BP medications several weeks ago. Reports he checked his BP at [facility] and came to ED for high reading.

## 2015-12-08 NOTE — ED Provider Notes (Signed)
CSN: [id number]     Arrival date & time 12/08/15  1906 History  By signing my name below, I, Ethan Schmidt, attest that this documentation has been prepared under the direction and in the presence of Milton Ferguson, MD. Electronically Signed: Judithann Sauger, ED Scribe. 12/08/2015. 9:20 PM.    Chief Complaint  Patient presents with  . Hypertension   Patient is a 55 y.o. male presenting with hypertension. The history is provided by the patient. No language interpreter was used.  Hypertension This is a new problem. Associated symptoms include headaches. Pertinent negatives include no chest pain and no abdominal pain. Nothing aggravates the symptoms. Nothing relieves the symptoms. He has tried nothing for the symptoms.   HPI Comments: Ethan Schmidt is a 55 y.o. male with a hx of HTN who presents to the Emergency Department complaining of gradually worsening moderate generalized HA onset several days ago. He reports associated fatigue and mild intermittent cough. Pt is currently hypertensive and per nursing note, pt has not taken his HTN medications for several weeks. No alleviating factors noted. Pt has not tried any medications PTA. He denies any fever or chills. No other complaints at this time.    Past Medical History  Diagnosis Date  . Hypertension   . Asthma    Past Surgical History  Procedure Laterality Date  . Appendectomy     No family history on file. Social History  Substance Use Topics  . Smoking status: Former Smoker -- 0.25 packs/day  . Smokeless tobacco: Current User  . Alcohol Use: No     Comment: occ    Review of Systems  Constitutional: Positive for fatigue. Negative for appetite change.  HENT: Negative for congestion, ear discharge and sinus pressure.   Eyes: Negative for discharge.  Respiratory: Positive for cough.   Cardiovascular: Negative for chest pain.  Gastrointestinal: Negative for abdominal pain and diarrhea.  Genitourinary: Negative for frequency  and hematuria.  Musculoskeletal: Negative for back pain.  Skin: Negative for rash.  Neurological: Positive for headaches. Negative for seizures.  Psychiatric/Behavioral: Negative for hallucinations.      Allergies  Shellfish allergy and Omnipaque  Home Medications   Prior to Admission medications   Medication Sig Start Date End Date Taking? Authorizing Provider  albuterol (PROVENTIL HFA;VENTOLIN HFA) 108 (90 BASE) MCG/ACT inhaler Inhale 2 puffs into the lungs every 6 (six) hours as needed for wheezing or shortness of breath. 06/30/14   Nita Sells, MD  amLODipine (NORVASC) 10 MG tablet Take 10 mg by mouth daily.    Historical Provider, MD  aspirin 81 MG tablet Take 81 mg by mouth daily.    Historical Provider, MD  Chlorphen-Phenyleph-ASA (ALKA-SELTZER PLUS COLD PO) Take 2 capsules by mouth every 4 (four) hours as needed (cold/congestion).    Historical Provider, MD  ciprofloxacin (CIPRO) 500 MG tablet Take 1 tablet (500 mg total) by mouth every 12 (twelve) hours. 04/20/15   Harvel Quale, MD  hydrochlorothiazide (HYDRODIURIL) 25 MG tablet Take 25 mg by mouth daily.    Historical Provider, MD  lisinopril (PRINIVIL,ZESTRIL) 40 MG tablet Take 40 mg by mouth daily.    Historical Provider, MD  metroNIDAZOLE (FLAGYL) 500 MG tablet Take 1 tablet (500 mg total) by mouth 3 (three) times daily. 04/20/15   Harvel Quale, MD  Multiple Vitamin (MULTIVITAMIN WITH MINERALS) TABS tablet Take 1 tablet by mouth daily.    Historical Provider, MD  ondansetron (ZOFRAN ODT) 4 MG disintegrating tablet Take 1 tablet (4 mg total)  by mouth every 8 (eight) hours as needed for nausea or vomiting. 04/20/15   Harvel Quale, MD   BP 174/101 mmHg  Pulse 62  Temp(Src) 98.2 F (36.8 C) (Oral)  Resp 15  Ht 6\' 1"  (1.854 m)  Wt 230 lb (104.327 kg)  BMI 30.35 kg/m2  SpO2 98% Physical Exam  Constitutional: He is oriented to person, place, and time. He appears well-developed. He appears lethargic.   HENT:  Head: Normocephalic.  Eyes: Conjunctivae and EOM are normal. No scleral icterus.  Neck: Neck supple. No thyromegaly present.  Cardiovascular: Normal rate and regular rhythm.  Exam reveals no gallop and no friction rub.   No murmur heard. Pulmonary/Chest: No stridor. He has no wheezes. He has no rales. He exhibits no tenderness.  Abdominal: He exhibits no distension. There is no tenderness. There is no rebound.  Musculoskeletal: Normal range of motion. He exhibits no edema.  Lymphadenopathy:    He has no cervical adenopathy.  Neurological: He is oriented to person, place, and time. He appears lethargic. He exhibits normal muscle tone. Coordination normal.  Skin: No rash noted. No erythema.  Psychiatric: He has a normal mood and affect. His behavior is normal.    ED Course  Procedures (including critical care time) DIAGNOSTIC STUDIES: Oxygen Saturation is 98% on RA, normal by my interpretation.    COORDINATION OF CARE: 9:16 PM- Pt advised of plan for treatment and pt agrees. Pt will receive CT head, EKG, and lab work for further evaluation. He will also receive Dilaudid and Zofran.    Labs Review Labs Reviewed - No data to display  Imaging Review No results found.   Milton Ferguson, MD has personally reviewed and evaluated these images and lab results as part of his medical decision-making.   EKG Interpretation None      MDM   Final diagnoses:  None    Patient with hypertension and headache. CT scan of the head negative patient's headache improved with treatment. He is to follow-up with his PCP for further regulation of his blood pressure this week.j The chart was scribed for me under my direct supervision.  I personally performed the history, physical, and medical decision making and all procedures in the evaluation of this patient.Milton Ferguson, MD 12/10/15 1242

## 2015-12-08 NOTE — ED Notes (Signed)
Pt states he checked his BP at home and it was elevated. States he ran out of his BP meds " a couple of weeks ago". Pt also c/o headache.

## 2015-12-08 NOTE — ED Notes (Signed)
Per triage nurse pt c/o CP when ambulating back to room from lobby. Pt placed on cardiac monitor and EKG performed.

## 2015-12-08 NOTE — ED Notes (Signed)
Pt's 02 saturation fell to 85% on room air while pt was sleeping and after giving IV pain medication. Pt placed on 1L [place] and O2 sat rose to 96%.

## 2016-01-26 ENCOUNTER — Emergency Department (HOSPITAL_COMMUNITY)
Admission: EM | Admit: 2016-01-26 | Discharge: 2016-01-26 | Disposition: A | Discharge status: Home / Self Care | Payer: Medicaid - Out of State | Attending: Emergency Medicine | Admitting: Emergency Medicine

## 2016-01-26 ENCOUNTER — Emergency Department (HOSPITAL_COMMUNITY): Payer: Medicaid - Out of State

## 2016-01-26 ENCOUNTER — Encounter (HOSPITAL_COMMUNITY): Payer: Self-pay

## 2016-01-26 DIAGNOSIS — R200 Anesthesia of skin: Secondary | ICD-10-CM | POA: Insufficient documentation

## 2016-01-26 DIAGNOSIS — Z79899 Other long term (current) drug therapy: Secondary | ICD-10-CM | POA: Insufficient documentation

## 2016-01-26 DIAGNOSIS — J45909 Unspecified asthma, uncomplicated: Secondary | ICD-10-CM | POA: Insufficient documentation

## 2016-01-26 DIAGNOSIS — G4489 Other headache syndrome: Secondary | ICD-10-CM | POA: Insufficient documentation

## 2016-01-26 DIAGNOSIS — Z87891 Personal history of nicotine dependence: Secondary | ICD-10-CM | POA: Insufficient documentation

## 2016-01-26 DIAGNOSIS — R059 Cough, unspecified: Secondary | ICD-10-CM

## 2016-01-26 DIAGNOSIS — I10 Essential (primary) hypertension: Secondary | ICD-10-CM | POA: Insufficient documentation

## 2016-01-26 DIAGNOSIS — Z7982 Long term (current) use of aspirin: Secondary | ICD-10-CM | POA: Insufficient documentation

## 2016-01-26 DIAGNOSIS — R05 Cough: Secondary | ICD-10-CM

## 2016-01-26 MED ORDER — METOCLOPRAMIDE HCL 5 MG/ML IJ SOLN
10.0000 mg | Freq: Once | INTRAMUSCULAR | Status: AC
  Administered 2016-01-26: 10 mg via INTRAVENOUS
  Filled 2016-01-26: qty 2

## 2016-01-26 MED ORDER — AMLODIPINE BESYLATE 10 MG PO TABS: 10.0000 mg | ORAL_TABLET | Freq: Every day | ORAL | 0 refills | Status: DC

## 2016-01-26 MED ORDER — KETOROLAC TROMETHAMINE 30 MG/ML IJ SOLN
15.0000 mg | Freq: Once | INTRAMUSCULAR | Status: AC
  Administered 2016-01-26: 15 mg via INTRAVENOUS
  Filled 2016-01-26: qty 1

## 2016-01-26 MED ORDER — DIPHENHYDRAMINE HCL 50 MG/ML IJ SOLN
25.0000 mg | Freq: Once | INTRAMUSCULAR | Status: AC
  Administered 2016-01-26: 25 mg via INTRAVENOUS
  Filled 2016-01-26: qty 1

## 2016-01-26 NOTE — ED Triage Notes (Signed)
Patient c/o headache that started this morning with posterior neck pain and dizziness. Headache started at 10:00am. Patient states pain increased throughout the day. s

## 2016-01-26 NOTE — Discharge Instructions (Signed)

## 2016-01-26 NOTE — ED Notes (Signed)
Pt alert & oriented x4, stable gait. Patient given discharge instructions, paperwork & prescription(s). Patient  instructed to stop at the registration desk to finish any additional paperwork. Patient verbalized understanding. Pt left department w/ no further questions. 

## 2016-01-26 NOTE — ED Provider Notes (Signed)
[facility] DEPT Provider Note   CSN: [id number] Arrival date & time: 01/26/16  2035  First Provider Contact:  None    By signing my name below, I, [name], attest that this documentation has been prepared under the direction and in the presence of Ripley Fraise, MD . Electronically Signed: [name], Scribe. 01/26/2016. 10:21 PM.  History   Chief Complaint Chief Complaint  Patient presents with  . Migraine   HPI Comments: Ethan Schmidt is a 55 y.o. male with PMHx of asthma, who presents to the Emergency Department complaining of gradually worsening, intermittent, headaches that began at ~10:00 AM this morning and worsened ~4 hours PTA. Pt reports his headaches feel like severe "pounding" in his head. He notes a hx of headaches that occur often and feel similar to his current symptoms; he states his last headache was ~3 -4 months ago. Pt notes associated dry cough, shortness of breath and numbness in his right forearm that radiates up towards his elbow; he reports these symptoms began ~10 hours ago. He denies any weakness in his right arm and other extremities. Pt also reports a hx of HTN; he states he believes his symptoms could have stemmed from this as he has run out of his blood pressure medication. He denies fever, vomiting, visual disturbances, chest pain, abdominal pain, recent falls, injuries, tick bites and rashes. He also denies hx of stroke and smoking.   The history is provided by the patient. No language interpreter was used.  Migraine  This is a recurrent problem. The current episode started 6 to 12 hours ago. The problem has been gradually worsening. Associated symptoms include headaches and shortness of breath. Pertinent negatives include no chest pain and no abdominal pain. Nothing aggravates the symptoms. Nothing relieves the symptoms. He has tried nothing for the symptoms.   Past Medical History:  Diagnosis Date  . Asthma   . Hypertension     Patient Active  Problem List   Diagnosis Date Noted  . Tobacco abuse 06/29/2014  . CAP (community acquired pneumonia) 06/29/2014  . Asthma exacerbation 06/29/2014  . AKI (acute kidney injury) ([place]) 06/29/2014    Past Surgical History:  Procedure Laterality Date  . APPENDECTOMY      Home Medications    Prior to Admission medications   Medication Sig Start Date End Date Taking? Authorizing Provider  albuterol (PROVENTIL HFA;VENTOLIN HFA) 108 (90 BASE) MCG/ACT inhaler Inhale 2 puffs into the lungs every 6 (six) hours as needed for wheezing or shortness of breath. 06/30/14   Nita Sells, MD  amLODipine (NORVASC) 10 MG tablet Take 10 mg by mouth daily.    Historical Provider, MD  aspirin 81 MG tablet Take 81 mg by mouth daily.    Historical Provider, MD  hydrochlorothiazide (HYDRODIURIL) 25 MG tablet Take 25 mg by mouth daily.    Historical Provider, MD  lisinopril (PRINIVIL,ZESTRIL) 40 MG tablet Take 40 mg by mouth daily.    Historical Provider, MD  Multiple Vitamin (MULTIVITAMIN WITH MINERALS) TABS tablet Take 1 tablet by mouth daily.    Historical Provider, MD  potassium chloride SA (K-DUR,KLOR-CON) 20 MEQ tablet Take 1 tablet (20 mEq total) by mouth daily. 12/08/15   Milton Ferguson, MD    Family History History reviewed. No pertinent family history.  Social History Social History  Substance Use Topics  . Smoking status: Former Smoker    Packs/day: 0.25  . Smokeless tobacco: Current User  . Alcohol use No     Comment: occ  Allergies   Shellfish allergy and Omnipaque [iohexol]   Review of Systems Review of Systems  Constitutional: Negative for fever.  Eyes: Negative for visual disturbance.  Respiratory: Positive for cough and shortness of breath.   Cardiovascular: Negative for chest pain.  Gastrointestinal: Negative for abdominal pain and vomiting.  Skin: Negative for rash.  Neurological: Positive for numbness and headaches. Negative for weakness.  All other systems reviewed  and are negative.  Physical Exam Updated Vital Signs BP (!) 152/105 (BP Location: Left Arm)   Pulse 76   Temp 98.5 F (36.9 C) (Oral)   Resp 16   Ht 6' (1.829 m)   Wt 215 lb (97.5 kg)   SpO2 99%   BMI 29.16 kg/m   Physical Exam CONSTITUTIONAL: Well developed/well nourished HEAD: Normocephalic/atraumatic EYES: EOMI/PERRL, no nystagmus, no ptosis ENMT: Mucous membranes moist NECK: supple no meningeal signs, no bruits SPINE/BACK:entire spine nontender CV: S1/S2 noted, no murmurs/rubs/gallops noted LUNGS: coarse BS noted bilaterally, no distress ABDOMEN: soft, nontender, no rebound or guarding GU:no cva tenderness NEURO:Awake/alert, face symmetric, no arm or leg drift is noted Equal 5/5 strength with shoulder abduction, elbow flex/extension, wrist flex/extension in upper extremities and equal hand grips bilaterally Equal 5/5 strength with hip flexion,knee flex/extension, foot dorsi/plantar flexion Cranial nerves 3/4/5/6/01/09/09/11/12 tested and intact Gait normal without ataxia No past pointing Pt reports numbness to ulnar aspect of right arm only, no other sensory deficits noted  EXTREMITIES: pulses normal, full ROM SKIN: warm, color normal PSYCH: no abnormalities of mood noted, alert and oriented to situation  ED Treatments / Results  Labs (all labs ordered are listed, but only abnormal results are displayed) Labs Reviewed - No data to display  EKG  EKG Interpretation None       Radiology Dg Chest 2 View  Result Date: 01/26/2016 CLINICAL DATA:  55 year old male with dry cough, weakness, and severe headache EXAM: CHEST  2 VIEW COMPARISON:  Chest radiograph dated 12/26/2014 FINDINGS: The heart size and mediastinal contours are within normal limits. Both lungs are clear. The visualized skeletal structures are unremarkable. IMPRESSION: No active cardiopulmonary disease. Electronically Signed   By: Anner Crete M.D.   On: 01/26/2016 23:13   Procedures Procedures    DIAGNOSTIC STUDIES:  Oxygen Saturation is 99% on RA, normnal by my interpretation.    COORDINATION OF CARE:  10:16 PM Discussed treatment plan, which includes CXR with pt at bedside and pt agreed to plan.   Medications Ordered in ED Medications  metoCLOPramide (REGLAN) injection 10 mg (10 mg Intravenous Given 01/26/16 2245)  diphenhydrAMINE (BENADRYL) injection 25 mg (25 mg Intravenous Given 01/26/16 2242)  ketorolac (TORADOL) 30 MG/ML injection 15 mg (15 mg Intravenous Given 01/26/16 2243)     Initial Impression / Assessment and Plan / ED Course  I have reviewed the triage vital signs and the nursing notes.  Pertinent labs & imaging results that were available during my care of the patient were reviewed by me and considered in my medical decision making (see chart for details).  Clinical Course    Pt with HA He had negative CT head last month He reports HA similar to prior He feels it is triggered by elevated BP and he is not taking meds Will refill norvasc As for cough and coarse BS he denies smoking history, and CXR negative He appears well for d/c home   Final Clinical Impressions(s) / ED Diagnoses   Final diagnoses:  Other headache syndrome  Essential hypertension  Cough  New Prescriptions New Prescriptions   AMLODIPINE (NORVASC) 10 MG TABLET    Take 1 tablet (10 mg total) by mouth daily.   I personally performed the services described in this documentation, which was scribed in my presence. The recorded information has been reviewed and is accurate.     Ripley Fraise, MD 01/26/16 [phone]

## 2016-06-05 ENCOUNTER — Emergency Department (HOSPITAL_COMMUNITY): Payer: Self-pay

## 2016-06-05 ENCOUNTER — Emergency Department (HOSPITAL_COMMUNITY)
Admission: EM | Admit: 2016-06-05 | Discharge: 2016-06-05 | Disposition: A | Discharge status: Home / Self Care | Payer: Self-pay | Attending: Emergency Medicine | Admitting: Emergency Medicine

## 2016-06-05 ENCOUNTER — Encounter (HOSPITAL_COMMUNITY): Payer: Self-pay | Admitting: *Deleted

## 2016-06-05 DIAGNOSIS — J45909 Unspecified asthma, uncomplicated: Secondary | ICD-10-CM | POA: Insufficient documentation

## 2016-06-05 DIAGNOSIS — Z79899 Other long term (current) drug therapy: Secondary | ICD-10-CM | POA: Insufficient documentation

## 2016-06-05 DIAGNOSIS — I10 Essential (primary) hypertension: Secondary | ICD-10-CM | POA: Insufficient documentation

## 2016-06-05 DIAGNOSIS — R112 Nausea with vomiting, unspecified: Secondary | ICD-10-CM | POA: Insufficient documentation

## 2016-06-05 DIAGNOSIS — E876 Hypokalemia: Secondary | ICD-10-CM | POA: Insufficient documentation

## 2016-06-05 DIAGNOSIS — R0789 Other chest pain: Secondary | ICD-10-CM | POA: Insufficient documentation

## 2016-06-05 DIAGNOSIS — R197 Diarrhea, unspecified: Secondary | ICD-10-CM | POA: Insufficient documentation

## 2016-06-05 DIAGNOSIS — Z7982 Long term (current) use of aspirin: Secondary | ICD-10-CM | POA: Insufficient documentation

## 2016-06-05 DIAGNOSIS — F1410 Cocaine abuse, uncomplicated: Secondary | ICD-10-CM | POA: Insufficient documentation

## 2016-06-05 DIAGNOSIS — F17200 Nicotine dependence, unspecified, uncomplicated: Secondary | ICD-10-CM | POA: Insufficient documentation

## 2016-06-05 LAB — LIPASE, BLOOD: Lipase: 18 U/L (ref 11–51)

## 2016-06-05 LAB — COMPREHENSIVE METABOLIC PANEL
ALBUMIN: 4.1 g/dL (ref 3.5–5.0)
ALT: 32 U/L (ref 17–63)
ANION GAP: 9 (ref 5–15)
AST: 29 U/L (ref 15–41)
Alkaline Phosphatase: 81 U/L (ref 38–126)
BILIRUBIN TOTAL: 0.8 mg/dL (ref 0.3–1.2)
BUN: 18 mg/dL (ref 6–20)
CO2: 31 mmol/L (ref 22–32)
Calcium: 9.1 mg/dL (ref 8.9–10.3)
Chloride: 101 mmol/L (ref 101–111)
Creatinine, Ser: 1.53 mg/dL — ABNORMAL HIGH (ref 0.61–1.24)
GFR calc Af Amer: 58 mL/min — ABNORMAL LOW (ref >60)
GFR calc non Af Amer: 50 mL/min — ABNORMAL LOW (ref >60)
GLUCOSE: 117 mg/dL — AB (ref 65–99)
POTASSIUM: 2.9 mmol/L — AB (ref 3.5–5.1)
SODIUM: 141 mmol/L (ref 135–145)
TOTAL PROTEIN: 7.8 g/dL (ref 6.5–8.1)

## 2016-06-05 LAB — CBC
HEMATOCRIT: 42.5 % (ref 39.0–52.0)
HEMOGLOBIN: 14.1 g/dL (ref 13.0–17.0)
MCH: 31.2 pg (ref 26.0–34.0)
MCHC: 33.2 g/dL (ref 30.0–36.0)
MCV: 94 fL (ref 78.0–100.0)
Platelets: 388 10*3/uL (ref 150–400)
RBC: 4.52 MIL/uL (ref 4.22–5.81)
RDW: 13.4 % (ref 11.5–15.5)
WBC: 8.7 10*3/uL (ref 4.0–10.5)

## 2016-06-05 LAB — URINE MICROSCOPIC-ADD ON

## 2016-06-05 LAB — I-STAT CHEM 8, ED
BUN: 19 mg/dL (ref 6–20)
CHLORIDE: 102 mmol/L (ref 101–111)
Calcium, Ion: 1.04 mmol/L — ABNORMAL LOW (ref 1.15–1.40)
Creatinine, Ser: 1.4 mg/dL — ABNORMAL HIGH (ref 0.61–1.24)
Glucose, Bld: 109 mg/dL — ABNORMAL HIGH (ref 65–99)
HEMATOCRIT: 40 % (ref 39.0–52.0)
Hemoglobin: 13.6 g/dL (ref 13.0–17.0)
POTASSIUM: 3.2 mmol/L — AB (ref 3.5–5.1)
SODIUM: 144 mmol/L (ref 135–145)
TCO2: 30 mmol/L (ref 0–100)

## 2016-06-05 LAB — I-STAT TROPONIN, ED: Troponin i, poc: 0 ng/mL (ref 0.00–0.08)

## 2016-06-05 LAB — TROPONIN I: Troponin I: <0.03 ng/mL (ref <0.03)

## 2016-06-05 LAB — URINALYSIS, ROUTINE W REFLEX MICROSCOPIC
BILIRUBIN URINE: NEGATIVE
Glucose, UA: NEGATIVE mg/dL
Ketones, ur: NEGATIVE mg/dL
LEUKOCYTES UA: NEGATIVE
NITRITE: NEGATIVE
PH: 8 (ref 5.0–8.0)
Protein, ur: NEGATIVE mg/dL
SPECIFIC GRAVITY, URINE: 1.01 (ref 1.005–1.030)

## 2016-06-05 LAB — I-STAT CG4 LACTIC ACID, ED: Lactic Acid, Venous: 1.59 mmol/L (ref 0.5–1.9)

## 2016-06-05 LAB — RAPID URINE DRUG SCREEN, HOSP PERFORMED
Amphetamines: NOT DETECTED
BARBITURATES: NOT DETECTED
BENZODIAZEPINES: NOT DETECTED
Cocaine: POSITIVE — AB
Opiates: NOT DETECTED
Tetrahydrocannabinol: POSITIVE — AB

## 2016-06-05 LAB — C DIFFICILE QUICK SCREEN W PCR REFLEX
C Diff antigen: NEGATIVE
C Diff interpretation: NOT DETECTED
C Diff toxin: NEGATIVE

## 2016-06-05 MED ORDER — IOPAMIDOL (ISOVUE-370) INJECTION 76%
100.0000 mL | Freq: Once | INTRAVENOUS | Status: AC | PRN
  Administered 2016-06-05: 100 mL via INTRAVENOUS

## 2016-06-05 MED ORDER — GI COCKTAIL ~~LOC~~
30.0000 mL | Freq: Once | ORAL | Status: AC
  Administered 2016-06-05: 30 mL via ORAL
  Filled 2016-06-05: qty 30

## 2016-06-05 MED ORDER — HYDROCORTISONE NA SUCCINATE PF 250 MG IJ SOLR
200.0000 mg | Freq: Once | INTRAMUSCULAR | Status: AC
  Administered 2016-06-05: 200 mg via INTRAVENOUS
  Filled 2016-06-05: qty 200

## 2016-06-05 MED ORDER — FAMOTIDINE IN NACL 20-0.9 MG/50ML-% IV SOLN
20.0000 mg | Freq: Once | INTRAVENOUS | Status: AC
  Administered 2016-06-05: 20 mg via INTRAVENOUS
  Filled 2016-06-05: qty 50

## 2016-06-05 MED ORDER — HYDROCORTISONE NA SUCCINATE PF 100 MG IJ SOLR
INTRAMUSCULAR | Status: AC
  Filled 2016-06-05: qty 4

## 2016-06-05 MED ORDER — DIPHENHYDRAMINE HCL 50 MG/ML IJ SOLN
25.0000 mg | Freq: Once | INTRAMUSCULAR | Status: AC
  Administered 2016-06-05: 25 mg via INTRAVENOUS
  Filled 2016-06-05: qty 1

## 2016-06-05 MED ORDER — PANTOPRAZOLE SODIUM 40 MG IV SOLR
40.0000 mg | Freq: Once | INTRAVENOUS | Status: AC
  Administered 2016-06-05: 40 mg via INTRAVENOUS
  Filled 2016-06-05: qty 40

## 2016-06-05 MED ORDER — SODIUM CHLORIDE 0.9 % IV BOLUS (SEPSIS)
1000.0000 mL | Freq: Once | INTRAVENOUS | Status: AC
  Administered 2016-06-05: 1000 mL via INTRAVENOUS

## 2016-06-05 MED ORDER — PANTOPRAZOLE SODIUM 20 MG PO TBEC: 20.0000 mg | DELAYED_RELEASE_TABLET | Freq: Every day | ORAL | 0 refills | Status: DC

## 2016-06-05 MED ORDER — ONDANSETRON HCL 4 MG PO TABS: 4.0000 mg | ORAL_TABLET | Freq: Four times a day (QID) | ORAL | 0 refills | Status: DC

## 2016-06-05 MED ORDER — ONDANSETRON HCL 4 MG/2ML IJ SOLN: 4.0000 mg | Freq: Once | INTRAMUSCULAR | Status: DC

## 2016-06-05 MED ORDER — POTASSIUM CHLORIDE 10 MEQ/100ML IV SOLN
10.0000 meq | INTRAVENOUS | Status: AC
  Administered 2016-06-05 (×3): 10 meq via INTRAVENOUS
  Filled 2016-06-05 (×3): qty 100

## 2016-06-05 MED ORDER — ONDANSETRON HCL 4 MG/2ML IJ SOLN
4.0000 mg | Freq: Once | INTRAMUSCULAR | Status: AC
  Administered 2016-06-05: 4 mg via INTRAVENOUS
  Filled 2016-06-05: qty 2

## 2016-06-05 MED ORDER — PROMETHAZINE HCL 25 MG/ML IJ SOLN
25.0000 mg | Freq: Once | INTRAMUSCULAR | Status: AC
  Administered 2016-06-05: 25 mg via INTRAVENOUS
  Filled 2016-06-05: qty 1

## 2016-06-05 NOTE — ED Notes (Signed)
BP right arm 172/112, BP in left arm 176/123,

## 2016-06-05 NOTE — ED Triage Notes (Signed)
Pt c/o chest and abd pain with n/v that started a few days ago,

## 2016-06-05 NOTE — Discharge Instructions (Signed)
Keep yourself hydrated. Take the nausea and stomach medications as prescribed. Return to the ED if you develop new or worsening symptoms. Stop using cocaine and marijuana.

## 2016-06-05 NOTE — ED Notes (Addendum)
Pt returned from ct, pt denies any problems with itching

## 2016-06-05 NOTE — ED Notes (Signed)
Pt had been informed previously of the need for a urine sample, but went to the restroom to urinate without providing a sample. Says he might be able to provide one again shortly.

## 2016-06-05 NOTE — ED Provider Notes (Signed)
[facility] DEPT Provider Note   CSN: [id number] Arrival date & time: 06/05/16  0317     History   Chief Complaint Chief Complaint  Patient presents with  . Abdominal Pain    HPI Ethan Schmidt is a 55 y.o. male.  Patient with history of hypertension presenting with a three-day history of upper abdominal pain that radiates into his chest associated with nausea and vomiting. States he's not been able to keep down his blood pressure medications for the past 3 days. States he's been vomiting constantly and has lost track of how may times he has vomited. Not been able to keep down any food or water. Denies any blood in his emesis. Denies any diarrhea. Had a normal bowel movement yesterday. The pain is constant, nothing makes it better or worse. No sick contacts or recent travel. Denies any drug or alcohol use. No other medications. Previous history of appendectomy. No history of cardiac issues. Denies any blood in his stools. Denies any melena. Denies ever having a stress test. Pain is diffuse in his epigastrium and chest. Denies    The history is provided by the patient.  Abdominal Pain   Associated symptoms include nausea and vomiting. Pertinent negatives include fever, diarrhea, dysuria, hematuria, headaches, arthralgias and myalgias.    Past Medical History:  Diagnosis Date  . Asthma   . Hypertension     Patient Active Problem List   Diagnosis Date Noted  . Tobacco abuse 06/29/2014  . CAP (community acquired pneumonia) 06/29/2014  . Asthma exacerbation 06/29/2014  . AKI (acute kidney injury) ([place]) 06/29/2014    Past Surgical History:  Procedure Laterality Date  . APPENDECTOMY         Home Medications    Prior to Admission medications   Medication Sig Start Date End Date Taking? Authorizing Provider  albuterol (PROVENTIL HFA;VENTOLIN HFA) 108 (90 BASE) MCG/ACT inhaler Inhale 2 puffs into the lungs every 6 (six) hours as needed for wheezing or shortness of breath.  06/30/14  Yes Nita Sells, MD  amLODipine (NORVASC) 10 MG tablet Take 1 tablet (10 mg total) by mouth daily. 01/26/16  Yes Ripley Fraise, MD  aspirin 81 MG tablet Take 81 mg by mouth daily.   Yes Historical Provider, MD  hydrochlorothiazide (HYDRODIURIL) 25 MG tablet Take 25 mg by mouth daily.   Yes Historical Provider, MD  lisinopril (PRINIVIL,ZESTRIL) 40 MG tablet Take 40 mg by mouth daily.   Yes Historical Provider, MD  Multiple Vitamin (MULTIVITAMIN WITH MINERALS) TABS tablet Take 1 tablet by mouth daily.   Yes Historical Provider, MD  potassium chloride SA (K-DUR,KLOR-CON) 20 MEQ tablet Take 1 tablet (20 mEq total) by mouth daily. 12/08/15   Milton Ferguson, MD    Family History No family history on file.  Social History Social History  Substance Use Topics  . Smoking status: Former Smoker    Packs/day: 0.25  . Smokeless tobacco: Current User  . Alcohol use No     Comment: occ     Allergies   Shellfish allergy and Omnipaque [iohexol]   Review of Systems Review of Systems  Constitutional: Positive for activity change, appetite change and fatigue. Negative for fever.  HENT: Negative for congestion and rhinorrhea.   Respiratory: Positive for chest tightness. Negative for shortness of breath.   Cardiovascular: Positive for chest pain.  Gastrointestinal: Positive for abdominal pain, nausea and vomiting. Negative for diarrhea.  Genitourinary: Negative for dysuria, hematuria and testicular pain.  Musculoskeletal: Negative for arthralgias, back pain and  myalgias.  Neurological: Negative for dizziness, weakness and headaches.     Physical Exam Updated Vital Signs BP (!) 187/116   Pulse 68   Temp 98.2 F (36.8 C) (Oral)   Resp 16   Ht 6\' 2"  (1.88 m)   Wt 245 lb (111.1 kg)   SpO2 98%   BMI 31.46 kg/m   Physical Exam  Constitutional: He is oriented to person, place, and time. He appears well-developed and well-nourished. No distress.  uncomfortable  HENT:  Head:  Normocephalic and atraumatic.  Mouth/Throat: Oropharynx is clear and moist. No oropharyngeal exudate.  Dry mucous membranes  Eyes: Conjunctivae and EOM are normal. Pupils are equal, round, and reactive to light.  Neck: Normal range of motion. Neck supple.  No meningismus.  Cardiovascular: Normal rate, regular rhythm, normal heart sounds and intact distal pulses.   No murmur heard. Equal radial, femoral, DP pulses  Pulmonary/Chest: Effort normal and breath sounds normal. No respiratory distress. He exhibits no tenderness.  Abdominal: Soft. There is tenderness. There is no rebound and no guarding.  Diffuse tenderness, worse in the epigastrium, no guarding or rebound. No rib or quadrant tenderness  Musculoskeletal: Normal range of motion. He exhibits no edema or tenderness.  Neurological: He is alert and oriented to person, place, and time. No cranial nerve deficit. He exhibits normal muscle tone. Coordination normal.  No ataxia on finger to nose bilaterally. No pronator drift. 5/5 strength throughout. CN 2-12 intact.Equal grip strength. Sensation intact.   Skin: Skin is warm.  Psychiatric: He has a normal mood and affect. His behavior is normal.  Nursing note and vitals reviewed.    ED Treatments / Results  Labs (all labs ordered are listed, but only abnormal results are displayed) Labs Reviewed  COMPREHENSIVE METABOLIC PANEL - Abnormal; Notable for the following:       Result Value   Potassium 2.9 (*)    Glucose, Bld 117 (*)    Creatinine, Ser 1.53 (*)    GFR calc non Af Amer 50 (*)    GFR calc Af Amer 58 (*)    All other components within normal limits  URINALYSIS, ROUTINE W REFLEX MICROSCOPIC (NOT AT [facility]) - Abnormal; Notable for the following:    Hgb urine dipstick TRACE (*)    All other components within normal limits  RAPID URINE DRUG SCREEN, HOSP PERFORMED - Abnormal; Notable for the following:    Cocaine POSITIVE (*)    Tetrahydrocannabinol POSITIVE (*)    All other  components within normal limits  URINE MICROSCOPIC-ADD ON - Abnormal; Notable for the following:    Squamous Epithelial / LPF 0-5 (*)    Bacteria, UA RARE (*)    All other components within normal limits  I-STAT CHEM 8, ED - Abnormal; Notable for the following:    Potassium 3.2 (*)    Creatinine, Ser 1.40 (*)    Glucose, Bld 109 (*)    Calcium, Ion 1.04 (*)    All other components within normal limits  C DIFFICILE QUICK SCREEN W PCR REFLEX  LIPASE, BLOOD  CBC  TROPONIN I  TROPONIN I  I-STAT CG4 LACTIC ACID, ED  I-STAT TROPOININ, ED    EKG  EKG Interpretation  Date/Time:  Sunday June 05 2016 03:32:55 EST Ventricular Rate:  68 PR Interval:    QRS Duration: 88 QT Interval:  410 QTC Calculation: 436 R Axis:   62 Text Interpretation:  Sinus rhythm Borderline short PR interval Probable left atrial enlargement Borderline T wave  abnormalities No significant change was found Confirmed by Wyvonnia Dusky  MD, Taralyn Ferraiolo [phone]) on 06/05/2016 3:51:40 AM       Radiology Ct Angio Chest/abd/pel For Dissection W And/or Wo Contrast  Result Date: 06/05/2016 CLINICAL DATA:  Acute onset of midsternal and sharp upper back pain, radiating down to the umbilicus and both lower quadrants. Nausea and vomiting. Initial encounter. EXAM: CT ANGIOGRAPHY CHEST, ABDOMEN AND PELVIS TECHNIQUE: Multidetector CT imaging through the chest, abdomen and pelvis was performed using the standard protocol during bolus administration of intravenous contrast. Multiplanar reconstructed images and MIPs were obtained and reviewed to evaluate the vascular anatomy. CONTRAST:  100 mL of Isovue 370 IV contrast COMPARISON:  Chest radiograph performed 01/26/2016, and CT of the abdomen and pelvis performed 04/20/2015 FINDINGS: CTA CHEST FINDINGS Cardiovascular: The thoracic aorta is unremarkable appearance. There is no evidence of aortic dissection. There is no evidence of aneurysmal dilatation. The great vessels are unremarkable in  appearance. No calcific atherosclerotic disease is seen. There is no evidence of central pulmonary embolus. The heart is unremarkable in appearance. Mediastinum/Nodes: No mediastinal lymphadenopathy is seen. No pericardial effusion is identified. The thyroid gland is unremarkable. No axillary lymphadenopathy is appreciated. Lungs/Pleura: Mild bibasilar atelectasis is noted. The lungs are otherwise clear. No pleural effusion or pneumothorax is seen. No masses are identified. Musculoskeletal: No acute osseous abnormalities are identified. The visualized musculature is unremarkable in appearance. Review of the MIP images confirms the above findings. CTA ABDOMEN AND PELVIS FINDINGS VASCULAR Aorta: There is no evidence of aortic dissection. There is no evidence of aneurysmal dilatation. Minimal calcification is noted at the aortic bifurcation. Celiac: The celiac trunk is unremarkable in appearance. SMA: The superior mesenteric artery is within normal limits. Renals: The renal arteries are intact bilaterally. IMA: The inferior mesenteric artery is unremarkable in appearance. Inflow: The common, external and internal iliac arteries are grossly unremarkable. The common femoral arteries appear grossly intact. Veins: Visualized venous structures are grossly unremarkable. The inferior vena cava appears intact. Review of the MIP images confirms the above findings. NON-VASCULAR Hepatobiliary: The liver is unremarkable in appearance. The gallbladder is unremarkable in appearance. The common bile duct remains normal in caliber. Pancreas: The pancreas is within normal limits. Spleen: The spleen is unremarkable in appearance. Adrenals/Urinary Tract: The adrenal glands are unremarkable in appearance. The kidneys are within normal limits. There is no evidence of hydronephrosis. No renal or ureteral stones are identified. No perinephric stranding is seen. Stomach/Bowel: The stomach is unremarkable in appearance. The small bowel is  within normal limits. The patient is status post appendectomy. Scattered diverticulosis is noted along the descending and sigmoid colon, without evidence of diverticulitis. Lymphatic: No retroperitoneal or pelvic sidewall lymphadenopathy is seen. Reproductive: The bladder is mildly distended and grossly unremarkable. The prostate remains normal in size. Other: No additional soft tissue abnormalities are seen. Musculoskeletal: No acute osseous abnormalities are identified. The visualized musculature is unremarkable in appearance. Review of the MIP images confirms the above findings. IMPRESSION: 1. No evidence of aortic dissection. No evidence of aneurysmal dilatation. No significant calcific atherosclerotic disease seen. 2. No evidence of central pulmonary embolus. 3. Mild bibasilar atelectasis.  Lungs otherwise clear. 4. Scattered diverticulosis along the descending and sigmoid colon, without evidence of diverticulitis. Electronically Signed   By: Garald Balding M.D.   On: 06/05/2016 06:09    Procedures Procedures (including critical care time)  Medications Ordered in ED Medications  diphenhydrAMINE (BENADRYL) injection 25 mg (not administered)  ondansetron (ZOFRAN) injection 4 mg (not  administered)  hydrocortisone sodium succinate (SOLU-CORTEF) injection 200 mg (not administered)  sodium chloride 0.9 % bolus 1,000 mL (not administered)  ondansetron (ZOFRAN) injection 4 mg (4 mg Intravenous Given 06/05/16 0344)     Initial Impression / Assessment and Plan / ED Course  I have reviewed the triage vital signs and the nursing notes.  Pertinent labs & imaging results that were available during my care of the patient were reviewed by me and considered in my medical decision making (see chart for details).  Clinical Course   Patient with upper abdominal pain radiating to the chest associated with nausea and vomiting has been constant for the past 3 days. EKG shows no acute changes. Equal upper showing  a blood pressures and grip strengths. Equal pulses.  Aortic dissection considered but seems less likely with gastrointestinal symptoms.  Labs show hypokalemia. LFTs and lipase normal. Troponin negative. CT negative for aortic dissection or other acute pathology.  BP has improved.   Drug screen positive for cocaine and THC.  Patient states he last used cocaine 4 days ago.  Repeat troponin pending.  He is tolerating PO without vomiting.  CT scan is reassuring. Has developed some diarrhea while in the ED. Suspect his vomiting and diarrhea may be viral or related to his drug use.  Second troponin pending and completion of potassium replacement pending.  Dr. zammit to assume care at shift change. Anticipate discharge home with supportive measures.  Final Clinical Impressions(s) / ED Diagnoses   Final diagnoses:  Nausea vomiting and diarrhea  Hypokalemia  Cocaine abuse    New Prescriptions New Prescriptions   No medications on file     Ezequiel Essex, MD 06/05/16 [phone]

## 2016-11-12 ENCOUNTER — Emergency Department (HOSPITAL_COMMUNITY): Payer: Self-pay

## 2016-11-12 ENCOUNTER — Emergency Department (HOSPITAL_COMMUNITY)
Admission: EM | Admit: 2016-11-12 | Discharge: 2016-11-12 | Disposition: A | Discharge status: Home / Self Care | Payer: Self-pay | Attending: Emergency Medicine | Admitting: Emergency Medicine

## 2016-11-12 ENCOUNTER — Encounter (HOSPITAL_COMMUNITY): Payer: Self-pay

## 2016-11-12 DIAGNOSIS — S90421A Blister (nonthermal), right great toe, initial encounter: Secondary | ICD-10-CM | POA: Insufficient documentation

## 2016-11-12 DIAGNOSIS — T148XXA Other injury of unspecified body region, initial encounter: Secondary | ICD-10-CM

## 2016-11-12 DIAGNOSIS — Y929 Unspecified place or not applicable: Secondary | ICD-10-CM | POA: Insufficient documentation

## 2016-11-12 DIAGNOSIS — Y939 Activity, unspecified: Secondary | ICD-10-CM | POA: Insufficient documentation

## 2016-11-12 DIAGNOSIS — Z7982 Long term (current) use of aspirin: Secondary | ICD-10-CM | POA: Insufficient documentation

## 2016-11-12 DIAGNOSIS — I159 Secondary hypertension, unspecified: Secondary | ICD-10-CM | POA: Insufficient documentation

## 2016-11-12 DIAGNOSIS — Z87891 Personal history of nicotine dependence: Secondary | ICD-10-CM | POA: Insufficient documentation

## 2016-11-12 DIAGNOSIS — J45909 Unspecified asthma, uncomplicated: Secondary | ICD-10-CM | POA: Insufficient documentation

## 2016-11-12 DIAGNOSIS — Z79899 Other long term (current) drug therapy: Secondary | ICD-10-CM | POA: Insufficient documentation

## 2016-11-12 DIAGNOSIS — X58XXXA Exposure to other specified factors, initial encounter: Secondary | ICD-10-CM | POA: Insufficient documentation

## 2016-11-12 DIAGNOSIS — Y999 Unspecified external cause status: Secondary | ICD-10-CM | POA: Insufficient documentation

## 2016-11-12 LAB — I-STAT CHEM 8, ED
BUN: 19 mg/dL (ref 6–20)
CHLORIDE: 103 mmol/L (ref 101–111)
Calcium, Ion: 1.12 mmol/L — ABNORMAL LOW (ref 1.15–1.40)
Creatinine, Ser: 1.4 mg/dL — ABNORMAL HIGH (ref 0.61–1.24)
GLUCOSE: 106 mg/dL — AB (ref 65–99)
HCT: 45 % (ref 39.0–52.0)
Hemoglobin: 15.3 g/dL (ref 13.0–17.0)
POTASSIUM: 3.1 mmol/L — AB (ref 3.5–5.1)
Sodium: 142 mmol/L (ref 135–145)
TCO2: 29 mmol/L (ref 0–100)

## 2016-11-12 MED ORDER — BACITRACIN ZINC 500 UNIT/GM EX OINT
TOPICAL_OINTMENT | Freq: Once | CUTANEOUS | Status: AC
  Administered 2016-11-12: 1 via TOPICAL
  Filled 2016-11-12: qty 0.9

## 2016-11-12 MED ORDER — HYDROCHLOROTHIAZIDE 25 MG PO TABS
25.0000 mg | ORAL_TABLET | Freq: Every day | ORAL | Status: DC
  Administered 2016-11-12: 25 mg via ORAL
  Filled 2016-11-12: qty 1

## 2016-11-12 MED ORDER — LISINOPRIL 10 MG PO TABS
40.0000 mg | ORAL_TABLET | Freq: Once | ORAL | Status: AC
  Administered 2016-11-12: 40 mg via ORAL
  Filled 2016-11-12: qty 4

## 2016-11-12 MED ORDER — HYDROCHLOROTHIAZIDE 25 MG PO TABS: 25.0000 mg | ORAL_TABLET | Freq: Every day | ORAL | 0 refills | Status: DC

## 2016-11-12 MED ORDER — POTASSIUM CHLORIDE CRYS ER 20 MEQ PO TBCR
40.0000 meq | EXTENDED_RELEASE_TABLET | Freq: Once | ORAL | Status: AC
  Administered 2016-11-12: 40 meq via ORAL
  Filled 2016-11-12: qty 2

## 2016-11-12 MED ORDER — LISINOPRIL 40 MG PO TABS: 40.0000 mg | ORAL_TABLET | Freq: Every day | ORAL | 0 refills | Status: DC

## 2016-11-12 MED ORDER — AMLODIPINE BESYLATE 10 MG PO TABS: 10.0000 mg | ORAL_TABLET | Freq: Every day | ORAL | 0 refills | Status: DC

## 2016-11-12 NOTE — ED Provider Notes (Signed)
[facility] DEPT Provider Note   CSN: [id number] Arrival date & time: 11/12/16  1009  By signing my name below, I, Ethan Schmidt, attest that this documentation has been prepared under the direction and in the presence of Ethan Schmidt, Ethan Cornea, MD . Electronically Signed: Higinio Schmidt, Scribe. 11/12/2016. 11:28 AM.  History   Chief Complaint Chief Complaint  Patient presents with  . Hypertension   The history is provided by the patient. No language interpreter was used.   HPI Comments: Ethan Schmidt is a 56 y.o. male with PMHx of asthma and HTN, who presents to the Emergency Department complaining of gradually worsening, generalized headache that he attributes to his HTN that began ~1 week ago. Pt reports he was taking Lisinopril, Amlodipine and Hydrochlorothiazide for his HTN but states he has been unable to refill his medications in ~1 month since moving from [place] due to Medicaid complications. He notes associated subjective "tingling" in his right hand. Pt also complains of an erupted blister to the top of his right great toe that "popped" last night. He states he awoke this morning with swelling and "mush" surrounding his right toe. Pt denies wearing any new shoes or socks, similar blisters anywhere else on his body, and known hx of DM.   Past Medical History:  Diagnosis Date  . Asthma   . Hypertension    Patient Active Problem List   Diagnosis Date Noted  . Tobacco abuse 06/29/2014  . CAP (community acquired pneumonia) 06/29/2014  . Asthma exacerbation 06/29/2014  . AKI (acute kidney injury) ([employer]) 06/29/2014   Past Surgical History:  Procedure Laterality Date  . APPENDECTOMY      Home Medications    Prior to Admission medications   Medication Sig Start Date End Date Taking? Authorizing Provider  aspirin 81 MG tablet Take 81 mg by mouth daily.   Yes [provider]  Multiple Vitamin (MULTIVITAMIN WITH MINERALS) TABS tablet Take 1 tablet by mouth daily.   Yes [provider]  amLODipine (NORVASC) 10 MG tablet Take 1 tablet (10 mg total) by mouth daily. 11/12/16   Essie Gehret, Ethan Cornea, MD  hydrochlorothiazide (HYDRODIURIL) 25 MG tablet Take 1 tablet (25 mg total) by mouth daily. 11/12/16   Kamia Insalaco, Ethan Cornea, MD  lisinopril (PRINIVIL,ZESTRIL) 40 MG tablet Take 1 tablet (40 mg total) by mouth daily. 11/12/16   Brailynn Breth, Ethan Cornea, MD    Family History No family history on file.  Social History Social History  Substance Use Topics  . Smoking status: Former Smoker    Packs/day: 0.25  . Smokeless tobacco: Never Used  . Alcohol use No     Comment: occ   Allergies   Shellfish allergy and Omnipaque [iohexol]  Review of Systems Review of Systems  Musculoskeletal: Positive for joint swelling (right great toe).  Skin:       +erupted blister to right great toe  Neurological: Positive for numbness (subjective "tingling" sensation in right hand) and headaches.  All other systems reviewed and are negative.  Physical Exam Updated Vital Signs BP (!) 189/108 (BP Location: Left Arm)   Pulse 68   Temp 97.9 F (36.6 C) (Oral)   Resp 18   Ht 6\' 1"  (1.854 m)   Wt 230 lb (104.3 kg)   SpO2 97%   BMI 30.34 kg/m   Physical Exam  Constitutional: He is oriented to person, place, and time. He appears well-developed and well-nourished.  HENT:  Head: Normocephalic and atraumatic.  Eyes: EOM are normal.  Neck: Normal  range of motion.  Cardiovascular: Normal rate, regular rhythm, normal heart sounds and intact distal pulses.   Pulmonary/Chest: Effort normal and breath sounds normal. No respiratory distress.  Abdominal: Soft. He exhibits no distension. There is no tenderness.  Musculoskeletal: Normal range of motion.  Neurological: He is alert and oriented to person, place, and time.  Cranial nerves intact.   Skin: Skin is warm and dry.  Erupted blister on the dorsum of his right first toe. Rash on right AC bicep.   Psychiatric: He has a normal mood and affect. Judgment  normal.  Nursing note and vitals reviewed.  ED Treatments / Results  DIAGNOSTIC STUDIES:  Oxygen Saturation is 97% on RA, normal by my interpretation.    COORDINATION OF CARE:  11:21 AM Discussed treatment Schmidt with pt at bedside and pt agreed to Schmidt.  Labs (all labs ordered are listed, but only abnormal results are displayed) Labs Reviewed  I-STAT CHEM 8, ED - Abnormal; Notable for the following:       Result Value   Potassium 3.1 (*)    Creatinine, Ser 1.40 (*)    Glucose, Bld 106 (*)    Calcium, Ion 1.12 (*)    All other components within normal limits    EKG  EKG Interpretation None       Radiology Ct Head Wo Contrast  Result Date: 11/12/2016 CLINICAL DATA:  Pt reports hasnt taken his bp meds in over 1 month. Reports headache and tingling in r hand for several days. Hx of htn,asthma/bbj EXAM: CT HEAD WITHOUT CONTRAST TECHNIQUE: Contiguous axial images were obtained from the base of the skull through the vertex without intravenous contrast. COMPARISON:  Head CT dated 12/08/2015. FINDINGS: Brain: Ventricles are normal in size and configuration. There is no mass, hemorrhage, edema or other evidence of acute parenchymal abnormality. No extra-axial hemorrhage. Vascular: No hyperdense vessel or unexpected calcification. Skull: Normal. Negative for fracture or focal lesion. Sinuses/Orbits: Mucosal thickening within the right maxillary sinus, incompletely imaged. Other: None. IMPRESSION: 1. No acute intracranial abnormality. No intracranial mass, hemorrhage or edema. 2. Mucosal thickening within the right maxillary sinus, incompletely imaged, of uncertain chronicity but new compared to earlier head CT of 12/08/2015. No associated fluid level seen to confirm an acute sinusitis. Electronically Signed   By: Franki Cabot M.D.   On: 11/12/2016 12:25    Procedures Procedures (including critical care time)  Medications Ordered in ED Medications  hydrochlorothiazide (HYDRODIURIL)  tablet 25 mg (25 mg Oral Given 11/12/16 1140)  lisinopril (PRINIVIL,ZESTRIL) tablet 40 mg (40 mg Oral Given 11/12/16 1140)  bacitracin ointment (1 application Topical Given 11/12/16 1301)  potassium chloride SA (K-DUR,KLOR-CON) CR tablet 40 mEq (40 mEq Oral Given 11/12/16 1301)    Initial Impression / Assessment and Schmidt / ED Course  I have reviewed the triage vital signs and the nursing notes.  Pertinent labs & imaging results that were available during my care of the patient were reviewed by me and considered in my medical decision making (see chart for details).    Hypertension, out of medication. Headache with a negative CT scan [place] head bleed unlikely. I suspect is related to his blood pressure. We'll restart his home blood pressure medications the patient stable for discharge. He also has a ruptured blister on his foot that just needs regular wound care. No evidence of bullous pemphigus or vulgaris.  I personally performed the services described in this documentation, which was scribed in my presence. The recorded information has been  reviewed and is accurate.   Final Clinical Impressions(s) / ED Diagnoses   Final diagnoses:  Secondary hypertension  Blister    New Prescriptions Discharge Medication List as of 11/12/2016 12:55 PM       Maryori Weide, Ethan Cornea, MD 11/12/16 1519

## 2016-11-12 NOTE — ED Notes (Signed)
Pt made aware to return if symptoms worsen or if any life threatening symptoms occur.  Dr. Dayna Barker informed of bp at this time and is okay to be discharged at this time. Pt given paperwork including prescriptions.

## 2016-11-12 NOTE — ED Notes (Signed)
R great toe wrapped and ointment applied.

## 2016-11-12 NOTE — ED Triage Notes (Signed)
Pt reports hasnt taken his bp meds in over 1 month.  Reports headache and tingling in r hand for several days.  Also reports a blister to top of r great toe that popped during the night.  Denies injury.

## 2016-12-14 ENCOUNTER — Emergency Department (HOSPITAL_COMMUNITY): Payer: Self-pay

## 2016-12-14 ENCOUNTER — Encounter (HOSPITAL_COMMUNITY): Payer: Self-pay

## 2016-12-14 ENCOUNTER — Emergency Department (HOSPITAL_COMMUNITY)
Admission: EM | Admit: 2016-12-14 | Discharge: 2016-12-14 | Disposition: A | Discharge status: Home / Self Care | Payer: Self-pay | Attending: Emergency Medicine | Admitting: Emergency Medicine

## 2016-12-14 DIAGNOSIS — Z79899 Other long term (current) drug therapy: Secondary | ICD-10-CM | POA: Insufficient documentation

## 2016-12-14 DIAGNOSIS — J45909 Unspecified asthma, uncomplicated: Secondary | ICD-10-CM | POA: Insufficient documentation

## 2016-12-14 DIAGNOSIS — R079 Chest pain, unspecified: Secondary | ICD-10-CM | POA: Insufficient documentation

## 2016-12-14 DIAGNOSIS — R519 Headache, unspecified: Secondary | ICD-10-CM

## 2016-12-14 DIAGNOSIS — Z7982 Long term (current) use of aspirin: Secondary | ICD-10-CM | POA: Insufficient documentation

## 2016-12-14 DIAGNOSIS — I10 Essential (primary) hypertension: Secondary | ICD-10-CM | POA: Insufficient documentation

## 2016-12-14 DIAGNOSIS — Z87891 Personal history of nicotine dependence: Secondary | ICD-10-CM | POA: Insufficient documentation

## 2016-12-14 DIAGNOSIS — R51 Headache: Secondary | ICD-10-CM | POA: Insufficient documentation

## 2016-12-14 DIAGNOSIS — R42 Dizziness and giddiness: Secondary | ICD-10-CM | POA: Insufficient documentation

## 2016-12-14 LAB — URINALYSIS, ROUTINE W REFLEX MICROSCOPIC
Bilirubin Urine: NEGATIVE
Glucose, UA: NEGATIVE mg/dL
Hgb urine dipstick: NEGATIVE
Ketones, ur: NEGATIVE mg/dL
Leukocytes, UA: NEGATIVE
Nitrite: NEGATIVE
Protein, ur: NEGATIVE mg/dL
Specific Gravity, Urine: 1.005 (ref 1.005–1.030)
pH: 7 (ref 5.0–8.0)

## 2016-12-14 LAB — BASIC METABOLIC PANEL
Anion gap: 13 (ref 5–15)
BUN: 36 mg/dL — ABNORMAL HIGH (ref 6–20)
CO2: 24 mmol/L (ref 22–32)
Calcium: 9 mg/dL (ref 8.9–10.3)
Chloride: 97 mmol/L — ABNORMAL LOW (ref 101–111)
Creatinine, Ser: 2.41 mg/dL — ABNORMAL HIGH (ref 0.61–1.24)
GFR calc Af Amer: 34 mL/min — ABNORMAL LOW (ref >60)
GFR calc non Af Amer: 29 mL/min — ABNORMAL LOW (ref >60)
Glucose, Bld: 103 mg/dL — ABNORMAL HIGH (ref 65–99)
Potassium: 2.7 mmol/L — CL (ref 3.5–5.1)
Sodium: 134 mmol/L — ABNORMAL LOW (ref 135–145)

## 2016-12-14 LAB — MAGNESIUM: Magnesium: 2.1 mg/dL (ref 1.7–2.4)

## 2016-12-14 LAB — RAPID URINE DRUG SCREEN, HOSP PERFORMED
Amphetamines: NOT DETECTED
Barbiturates: NOT DETECTED
Benzodiazepines: NOT DETECTED
Cocaine: POSITIVE — AB
Opiates: NOT DETECTED
Tetrahydrocannabinol: POSITIVE — AB

## 2016-12-14 LAB — CBC WITH DIFFERENTIAL/PLATELET
Basophils Absolute: 0.1 10*3/uL (ref 0.0–0.1)
Basophils Relative: 1 %
Eosinophils Absolute: 0.2 10*3/uL (ref 0.0–0.7)
Eosinophils Relative: 2 %
HCT: 36.8 % — ABNORMAL LOW (ref 39.0–52.0)
Hemoglobin: 12.8 g/dL — ABNORMAL LOW (ref 13.0–17.0)
Lymphocytes Relative: 48 %
Lymphs Abs: 4.4 10*3/uL — ABNORMAL HIGH (ref 0.7–4.0)
MCH: 31.1 pg (ref 26.0–34.0)
MCHC: 34.8 g/dL (ref 30.0–36.0)
MCV: 89.5 fL (ref 78.0–100.0)
Monocytes Absolute: 0.7 10*3/uL (ref 0.1–1.0)
Monocytes Relative: 7 %
Neutro Abs: 3.9 10*3/uL (ref 1.7–7.7)
Neutrophils Relative %: 42 %
Platelets: 421 10*3/uL — ABNORMAL HIGH (ref 150–400)
RBC: 4.11 MIL/uL — ABNORMAL LOW (ref 4.22–5.81)
RDW: 12.4 % (ref 11.5–15.5)
WBC: 9.3 10*3/uL (ref 4.0–10.5)

## 2016-12-14 LAB — I-STAT TROPONIN, ED: Troponin i, poc: 0 ng/mL (ref 0.00–0.08)

## 2016-12-14 LAB — CBG MONITORING, ED: Glucose-Capillary: 115 mg/dL — ABNORMAL HIGH (ref 65–99)

## 2016-12-14 LAB — TROPONIN I: Troponin I: <0.03 ng/mL (ref <0.03)

## 2016-12-14 MED ORDER — KETOROLAC TROMETHAMINE 30 MG/ML IJ SOLN
15.0000 mg | Freq: Once | INTRAMUSCULAR | Status: AC
  Administered 2016-12-14: 15 mg via INTRAVENOUS
  Filled 2016-12-14: qty 1

## 2016-12-14 MED ORDER — SODIUM CHLORIDE 0.9 % IV BOLUS (SEPSIS)
1000.0000 mL | Freq: Once | INTRAVENOUS | Status: AC
  Administered 2016-12-14: 1000 mL via INTRAVENOUS

## 2016-12-14 MED ORDER — LORAZEPAM 2 MG/ML IJ SOLN
0.5000 mg | Freq: Once | INTRAMUSCULAR | Status: DC
  Filled 2016-12-14: qty 1

## 2016-12-14 MED ORDER — MECLIZINE HCL 12.5 MG PO TABS
25.0000 mg | ORAL_TABLET | Freq: Once | ORAL | Status: AC
  Administered 2016-12-14: 25 mg via ORAL
  Filled 2016-12-14: qty 2

## 2016-12-14 MED ORDER — DIPHENHYDRAMINE HCL 50 MG/ML IJ SOLN
12.5000 mg | Freq: Once | INTRAMUSCULAR | Status: AC
  Administered 2016-12-14: 12.5 mg via INTRAVENOUS
  Filled 2016-12-14: qty 1

## 2016-12-14 MED ORDER — METOCLOPRAMIDE HCL 5 MG/ML IJ SOLN
10.0000 mg | Freq: Once | INTRAMUSCULAR | Status: AC
  Administered 2016-12-14: 10 mg via INTRAVENOUS
  Filled 2016-12-14: qty 2

## 2016-12-14 MED ORDER — POTASSIUM CHLORIDE CRYS ER 20 MEQ PO TBCR
60.0000 meq | EXTENDED_RELEASE_TABLET | Freq: Once | ORAL | Status: AC
  Administered 2016-12-14: 60 meq via ORAL
  Filled 2016-12-14: qty 3

## 2016-12-14 MED ORDER — FENTANYL CITRATE (PF) 100 MCG/2ML IJ SOLN
75.0000 ug | Freq: Once | INTRAMUSCULAR | Status: AC
  Administered 2016-12-14: 75 ug via INTRAVENOUS
  Filled 2016-12-14: qty 2

## 2016-12-14 NOTE — ED Notes (Signed)
Pt given crackers and water at this time.

## 2016-12-14 NOTE — ED Notes (Signed)
Pt given snack at this time  

## 2016-12-14 NOTE — ED Notes (Addendum)
Per EDP evaluation, CT only at this time.

## 2016-12-14 NOTE — ED Provider Notes (Signed)
[facility] DEPT Provider Note   CSN: [id number] Arrival date & time: 12/14/16  1350  By signing my name below, I, Ethan Schmidt, attest that this documentation has been prepared under the direction and in the presence of Ethan Manifold, MD . Electronically Signed: Evelene Schmidt, Scribe. 12/14/2016. 2:13 PM.  History   Chief Complaint Chief Complaint  Patient presents with  . Dizziness  . Chest Pain    The history is provided by the patient. No language interpreter was used.     HPI Comments:  Ethan Schmidt is a 56 y.o. male with a history of HTN, who presents to the Emergency Department complaining of a constant behind right eye HA that began ~1 hour ago  He rates his pain a 9/10 and notes gradual onset. He denies h/o similar HA. He describes associated room spinning dizziness and states he feels like he is going to pass out. Pt also notes CP and tingling to the right ring and little. He denies weakness in his extremities. No alleviating factors noted. No illicit drug use. Pt is compliant with his HTN meds.   Past Medical History:  Diagnosis Date  . Asthma   . Hypertension     Patient Active Problem List   Diagnosis Date Noted  . Tobacco abuse 06/29/2014  . CAP (community acquired pneumonia) 06/29/2014  . Asthma exacerbation 06/29/2014  . AKI (acute kidney injury) ([place]) 06/29/2014    Past Surgical History:  Procedure Laterality Date  . APPENDECTOMY         Home Medications    Prior to Admission medications   Medication Sig Start Date End Date Taking? Authorizing Provider  amLODipine (NORVASC) 10 MG tablet Take 1 tablet (10 mg total) by mouth daily. 11/12/16   Mesner, [name], MD  aspirin 81 MG tablet Take 81 mg by mouth daily.    [provider]  hydrochlorothiazide (HYDRODIURIL) 25 MG tablet Take 1 tablet (25 mg total) by mouth daily. 11/12/16   Mesner, [name], MD  lisinopril (PRINIVIL,ZESTRIL) 40 MG tablet Take 1 tablet (40 mg total) by mouth daily.  11/12/16   Mesner, [name], MD  Multiple Vitamin (MULTIVITAMIN WITH MINERALS) TABS tablet Take 1 tablet by mouth daily.    [provider]    Family History No family history on file.  Social History Social History  Substance Use Topics  . Smoking status: Former Smoker    Packs/day: 0.25  . Smokeless tobacco: Never Used  . Alcohol use No     Comment: occ     Allergies   Shellfish allergy and Omnipaque [iohexol]   Review of Systems Review of Systems  Cardiovascular: Positive for chest pain.  Neurological: Positive for dizziness and headaches. Negative for weakness.  All other systems reviewed and are negative.    Physical Exam Updated Vital Signs BP (!) 85/62 (BP Location: Left Arm)   Pulse 69   Resp 17   Ht 6\' 1"  (1.854 m)   Wt 220 lb (99.8 kg)   SpO2 98%   BMI 29.03 kg/m   Physical Exam  Constitutional: He is oriented to person, place, and time. He appears well-developed and well-nourished.  Appears uncomfortable; laying with eyes closed  HENT:  Head: Normocephalic and atraumatic.  Eyes: EOM are normal.  No nystagmus  Neck: Normal range of motion. Neck supple.  Cardiovascular: Normal rate, regular rhythm, normal heart sounds and intact distal pulses.   Pulmonary/Chest: Effort normal and breath sounds normal. No respiratory distress.  Abdominal: Soft. He exhibits  no distension. There is no tenderness.  Musculoskeletal: Normal range of motion.  Neurological: He is alert and oriented to person, place, and time. No cranial nerve deficit or sensory deficit.  Strength is 5/5 BUE and BLE Good finger to nose bilaterally    Skin: Skin is warm and dry.  Psychiatric: He has a normal mood and affect.  Nursing note and vitals reviewed.    ED Treatments / Results  DIAGNOSTIC STUDIES:  Oxygen Saturation is 95% on RA, adequate by my interpretation.    COORDINATION OF CARE:  2:01 PM Discussed treatment plan with pt at bedside and pt agreed to  plan.  Labs (all labs ordered are listed, but only abnormal results are displayed) Labs Reviewed  CBC WITH DIFFERENTIAL/PLATELET - Abnormal; Notable for the following:       Result Value   RBC 4.11 (*)    Hemoglobin 12.8 (*)    HCT 36.8 (*)    Platelets 421 (*)    Lymphs Abs 4.4 (*)    All other components within normal limits  URINALYSIS, ROUTINE W REFLEX MICROSCOPIC - Abnormal; Notable for the following:    Color, Urine STRAW (*)    All other components within normal limits  RAPID URINE DRUG SCREEN, HOSP PERFORMED - Abnormal; Notable for the following:    Cocaine POSITIVE (*)    Tetrahydrocannabinol POSITIVE (*)    All other components within normal limits  BASIC METABOLIC PANEL - Abnormal; Notable for the following:    Sodium 134 (*)    Potassium 2.7 (*)    Chloride 97 (*)    Glucose, Bld 103 (*)    BUN 36 (*)    Creatinine, Ser 2.41 (*)    GFR calc non Af Amer 29 (*)    GFR calc Af Amer 34 (*)    All other components within normal limits  CBG MONITORING, ED - Abnormal; Notable for the following:    Glucose-Capillary 115 (*)    All other components within normal limits  MAGNESIUM  TROPONIN I  I-STAT TROPOININ, ED    EKG  EKG Interpretation  Date/Time:  Wednesday December 14 2016 17:45:07 EDT Ventricular Rate:  63 PR Interval:    QRS Duration: 93 QT Interval:  445 QTC Calculation: 456 R Axis:   75 Text Interpretation:  Sinus rhythm Consider left ventricular hypertrophy Nonspecific T abnormalities, inferior leads Confirmed by Wilson Singer  MD, [name] ([id number]) on 12/14/2016 6:15:28 PM       Radiology No results found.   Ct Head Wo Contrast  Result Date: 12/14/2016 CLINICAL DATA:  Pain behind the right eye with headache beginning 1 hour ago. EXAM: CT HEAD WITHOUT CONTRAST TECHNIQUE: Contiguous axial images were obtained from the base of the skull through the vertex without intravenous contrast. COMPARISON:  11/12/2016 FINDINGS: Brain: No evidence of acute infarction,  hemorrhage, hydrocephalus, extra-axial collection or mass lesion/mass effect. Vascular: No hyperdense vessel or unexpected calcification. Skull: Normal. Negative for fracture or focal lesion. Sinuses/Orbits: Slight exotropia of the right eye with lateral gaze noted relative to left. No retrobulbar nor extraocular muscle abnormalities. No acute sinus or mastoid abnormalities. Other: None IMPRESSION: 1. No acute intracranial abnormality. 2. Slight exotropia of the right eye with lateral gaze noted relative to left. No retrobulbar abnormality however is noted. Electronically Signed   By: Ashley Royalty M.D.   On: 12/14/2016 14:34   Dg Chest Portable 1 View  Result Date: 12/14/2016 CLINICAL DATA:  Chest pain EXAM: PORTABLE CHEST 1 VIEW COMPARISON:  None. FINDINGS: Shallow lung inflation with mild enlarged cardiomediastinal silhouette, likely exaggerated. Lungs are clear. IMPRESSION: No active disease. Electronically Signed   By: Ulyses Jarred M.D.   On: 12/14/2016 14:50   Procedures Procedures (including critical care time)  Medications Ordered in ED Medications - No data to display   Initial Impression / Assessment and Plan / ED Course  I have reviewed the triage vital signs and the nursing notes.  Pertinent labs & imaging results that were available during my care of the patient were reviewed by me and considered in my medical decision making (see chart for details).     Feeling much better. I doubt CVA. CP resolved. EKG w/o acute change. Cocaine noted on UDS although pt continues to deny. It has been determined that no acute conditions requiring further emergency intervention are present at this time. The patient has been advised of the diagnosis and plan. I reviewed any labs and imaging including any potential incidental findings. We have discussed signs and symptoms that warrant return to the ED and they are listed in the discharge instructions.    Final Clinical Impressions(s) / ED Diagnoses    Final diagnoses:  Nonintractable headache, unspecified chronicity pattern, unspecified headache type  Chest pain, unspecified type    New Prescriptions New Prescriptions   No medications on file   I personally preformed the services scribed in my presence. The recorded information has been reviewed is accurate. Ethan Manifold, MD.     Ethan Manifold, MD 12/26/16 [phone]

## 2016-12-14 NOTE — ED Notes (Signed)
Pt states understanding of d/c instructions, follow up care, and to not drive d/t medication received here. Pt to have daughter pick him up. Steady and even gait noted as pt walked to lobby.

## 2016-12-14 NOTE — ED Triage Notes (Signed)
Headache, chest pain and dizziness that began today at 1250. EDP at bedside.

## 2016-12-14 NOTE — ED Notes (Signed)
Holding ativan at this time per MD Wilson Singer d/t blood pressure.

## 2017-01-31 ENCOUNTER — Telehealth: Payer: Self-pay

## 2017-01-31 DIAGNOSIS — Z139 Encounter for screening, unspecified: Secondary | ICD-10-CM

## 2017-01-31 LAB — GLUCOSE, POCT (MANUAL RESULT ENTRY): POC Glucose: 129 mg/dl — AB (ref 70–99)

## 2017-01-31 NOTE — Telephone Encounter (Signed)
Called to follow up with client after screening. Client states he is fine, but waiting at home currently for someone to take him to the emergency room for treatment of his blood pressure.  Dicussed again to call 911 for any sudden headache, vision changes, difficulty with speech, facial weakness or weakness or loss of strength in his legs or arms. Also to call for chest pains or shortness of breath. Client verbally states understanding.   Will follow up with client after free clinic appointment that is scheduled for 02/01/17.

## 2017-01-31 NOTE — Congregational Nurse Program (Signed)
Congregational Nurse Program Note  Date of Encounter: 01/31/2017  Past Medical History: Past Medical History:  Diagnosis Date  . Asthma   . Hypertension     Encounter Details:     CNP Questionnaire - 01/31/17 1514      Patient Demographics   Is this a new or existing patient? New   Patient is considered a/an Not Applicable   Race African-American/Black     Patient Assistance   Location of Patient [place]   Patient's financial/insurance status Low Income;Self-Pay (Uninsured)   Uninsured Patient ([place]) Yes   Interventions Counseled to make appt. with provider;Referred to ED/Urgent Care;Assisted patient in making appt.   Patient referred to apply for the following financial assistance Not Applicable   Food insecurities addressed Not Applicable   Transportation assistance No   Assistance securing medications No   Educational health offerings Navigating the healthcare system;Hypertension     Encounter Details   Primary purpose of visit Chronic Illness/Condition Visit;Education/Health Concerns;Navigating the Healthcare System   Was an Emergency Department visit averted? No   Does patient have a medical provider? No   Patient referred to Area Agency;Clinic;Establish PCP;Emergency Department   Was a mental health screening completed? (GAINS tool) No   Does patient have dental issues? No   Does patient have vision issues? Yes   Was a vision referral made? No   Does your patient have an abnormal blood pressure today? Yes   Since previous encounter, have you referred patient for abnormal blood pressure that resulted in a new diagnosis or medication change? No   Does your patient have an abnormal blood glucose today? No   Since previous encounter, have you referred patient for abnormal blood glucose that resulted in a new diagnosis or medication change? No     New Client to [employer]. Client here today seeking help navigating into a  primary care provider. Client states he has not been to a primary medical doctor in "years". Client currently lives with his sister who he says basically raised him and he call her "Mom". He has no income and has no type of health insurance. He states he has had no blood pressure medications in 2 months. He was last seen in [facility] ER on 12/14/16 for headache and chest pain. He states he was told he was dehydrated and given fluids. Prior to that he was seen in [place] per client on 11/2016 for high blood pressure and given prescriptions for Norvasc, HCTZ and lisinopril. He states he took them until that prescriptions ran out.  Past Medical History: Pneumonia Asthma Kidney injury Hypertension   Allergies: Iodine : swelling Shellfish: swelling  Medications: Norvasc 10 mg by mouth once daily (not taking) Hydrodiuril 25 mg by mouth once daily(not taking) Lisinopril 40 mg by  Mouth once daily(not taking)  Alert and oriented to person place and time. Answers questions appropriately. Does not appear anxious. States he has been having headaches that come and go and that he did have one this morning. He states the headache is left frontal area. He denies headache at present. Client also reports blurriness of vision at times, greater in his left eye.Denies chest pains at present. He also reports numbness and decreased strength in his right hand which he states he told the ER Dr on 12/14/16. He states they did do a CT Scan at that time and again treated him for "dehydration with some fluids". Grips 4+left, 3+ right, some weakness  in right hand grip strength noted. Client's gait steady and unassisted today, denies any leg pain or weakness however he states at times he feels like he's walking "sideways" gait normal today by observation. Denies any difficulty with speech or any facial numbness or weakness. Vitals signs today: left arm blood pressure 177/121, pulse regular at 79 Right arm blood  pressure 172/128 pulse 72. , glucose 129 random,. Temp 98.9 orally.  Client denies any alcohol or drug use. Denies thoughts of suicide or homicide. Discussed the importance of controlling his hypertension and the risks of uncontrolled high blood pressure, being increased risk of stroke and heart attack. Dicussed options for primary medical care and also that he should seek immediate treatment and evaluation of his high blood pressure today. Client states he would like a referral into the [facility], but that he "has things to do" and "will go to the Emergency room later or tomorrow". Again, expressed the danger of uncontrolled blood pressure and that his blood pressures today are very elevated and dangerous and puts him at greater risk for stroke and heart attack and client states understanding verbally. He wishes to proceed with referral to the [facility] and appointment secured for earliest as possible. 02/01/17 at 1:00 pm, contact information given to client. Again RN recommended client to go to the emergency room today for evaluation and treatment of his blood pressure. Client states that he will go. Asked client if we could follow up with him by phone later today and after his free clinic appointment and he gives [place] permission to call for follow up.  Information given on high blood pressure. Discussed about avoiding adding salt to food and decrease foods that contain excess salt and how salt can affect his blood pressure.  Discussed calling 911 for any symptoms of heart attack or stroke, vision changes, severe headache, sudden changes in vision, facial weakness, difficulty with speech, weakness of upper and lower extremities, chest pain, shortness of breath, sweating. Client states understanding.  Will follow as needed.

## 2017-02-01 ENCOUNTER — Other Ambulatory Visit: Payer: Self-pay | Admitting: Physician Assistant

## 2017-02-01 ENCOUNTER — Encounter: Payer: Self-pay | Admitting: Physician Assistant

## 2017-02-01 ENCOUNTER — Ambulatory Visit: Payer: Self-pay | Admitting: Physician Assistant

## 2017-02-01 VITALS — BP 138/90 | HR 73 | Ht 70.75 in | Wt 208.5 lb

## 2017-02-01 DIAGNOSIS — Z131 Encounter for screening for diabetes mellitus: Secondary | ICD-10-CM

## 2017-02-01 DIAGNOSIS — I10 Essential (primary) hypertension: Secondary | ICD-10-CM

## 2017-02-01 DIAGNOSIS — Z13220 Encounter for screening for lipoid disorders: Secondary | ICD-10-CM

## 2017-02-01 DIAGNOSIS — Z1211 Encounter for screening for malignant neoplasm of colon: Secondary | ICD-10-CM

## 2017-02-01 DIAGNOSIS — N289 Disorder of kidney and ureter, unspecified: Secondary | ICD-10-CM

## 2017-02-01 DIAGNOSIS — E876 Hypokalemia: Secondary | ICD-10-CM

## 2017-02-01 DIAGNOSIS — D649 Anemia, unspecified: Secondary | ICD-10-CM

## 2017-02-01 DIAGNOSIS — Z87891 Personal history of nicotine dependence: Secondary | ICD-10-CM

## 2017-02-01 DIAGNOSIS — F1910 Other psychoactive substance abuse, uncomplicated: Secondary | ICD-10-CM

## 2017-02-01 DIAGNOSIS — Z125 Encounter for screening for malignant neoplasm of prostate: Secondary | ICD-10-CM

## 2017-02-01 MED ORDER — LISINOPRIL-HYDROCHLOROTHIAZIDE 20-12.5 MG PO TABS: 1.0000 | ORAL_TABLET | Freq: Every day | ORAL | 1 refills | Status: DC

## 2017-02-01 NOTE — Progress Notes (Signed)
BP 138/90 (BP Location: Left Arm, Patient Position: Sitting, Cuff Size: Normal)   Pulse 73   Ht 5' 10.75" (1.797 m)   Wt 208 lb 8 oz (94.6 kg)   SpO2 98%   BMI 29.29 kg/m    Subjective:    Patient ID: Ethan Schmidt, male    DOB: 1960/11/04, 56 y.o.   MRN: [id number]  HPI: Arrington Yohe is a 56 y.o. male presenting on 02/01/2017 for New Patient (Initial Visit) (pt has been out of HTN meds about 3 months ago. pt states he has started having headaches)   HPI Chief Complaint  Patient presents with  . New Patient (Initial Visit)    pt has been out of HTN meds about 3 months ago. pt states he has started having headaches    Pt seen in ER 12/14/16 and stated he was compliant with his bp meds.  BP was low and labs + for cocaine and marijuana with low K+.  Pt is not forthcoming about drug use.   Pt was also untruthful about previous smoking history.  Today he insists that he has never been a smoker but multiple visits to the hospital in previous years states that pt was a smoker.  Pt states moved here from [place] in 2015 but says he didn't have PCP there either.   He has just been sporadically getting bp meds in the ER.    Pt says he has been out of his bp meds for about a month.  Last got some in the ER.   Pt states has never had colonoscopy.   Pt says he is feeling pretty good.   Relevant past medical, surgical, family and social history reviewed and updated as indicated. Interim medical history since our last visit reviewed. Allergies and medications reviewed and updated.   Current Outpatient Prescriptions:  Marland Kitchen  Multiple Vitamin (MULTIVITAMIN WITH MINERALS) TABS tablet, Take 1 tablet by mouth daily., Disp: , Rfl:  .  amLODipine (NORVASC) 10 MG tablet, Take 1 tablet (10 mg total) by mouth daily. (Patient not taking: Reported on 01/31/2017), Disp: 21 tablet, Rfl: 0 .  aspirin 81 MG tablet, Take 81 mg by mouth daily., Disp: , Rfl:  .  hydrochlorothiazide (HYDRODIURIL) 25 MG tablet, Take 1  tablet (25 mg total) by mouth daily. (Patient not taking: Reported on 01/31/2017), Disp: 21 tablet, Rfl: 0 .  lisinopril (PRINIVIL,ZESTRIL) 40 MG tablet, Take 1 tablet (40 mg total) by mouth daily. (Patient not taking: Reported on 01/31/2017), Disp: 21 tablet, Rfl: 0   Review of Systems  Constitutional: Positive for diaphoresis and unexpected weight change. Negative for appetite change, chills, fatigue and fever.  HENT: Negative for congestion, dental problem, drooling, ear pain, facial swelling, hearing loss, mouth sores, sneezing, sore throat, trouble swallowing and voice change.   Eyes: Negative for pain, discharge, redness, itching and visual disturbance.  Respiratory: Negative for cough, choking, shortness of breath and wheezing.   Cardiovascular: Negative for chest pain, palpitations and leg swelling.  Gastrointestinal: Negative for abdominal pain, blood in stool, constipation, diarrhea and vomiting.  Endocrine: Negative for cold intolerance, heat intolerance and polydipsia.  Genitourinary: Negative for decreased urine volume, dysuria and hematuria.  Musculoskeletal: Negative for arthralgias, back pain and gait problem.  Skin: Negative for rash.  Allergic/Immunologic: Negative for environmental allergies.  Neurological: Positive for headaches. Negative for seizures, syncope and light-headedness.  Hematological: Negative for adenopathy.  Psychiatric/Behavioral: Negative for agitation, dysphoric mood and suicidal ideas. The patient is not nervous/anxious.  Per HPI unless specifically indicated above     Objective:    BP 138/90 (BP Location: Left Arm, Patient Position: Sitting, Cuff Size: Normal)   Pulse 73   Ht 5' 10.75" (1.797 m)   Wt 208 lb 8 oz (94.6 kg)   SpO2 98%   BMI 29.29 kg/m   Wt Readings from Last 3 Encounters:  02/01/17 208 lb 8 oz (94.6 kg)  01/31/17 208 lb 6.4 oz (94.5 kg)  12/14/16 220 lb (99.8 kg)    Physical Exam  Constitutional: He is oriented to person,  place, and time. He appears well-developed and well-nourished.  HENT:  Head: Normocephalic and atraumatic.  Mouth/Throat: Oropharynx is clear and moist. No oropharyngeal exudate.  Eyes: Pupils are equal, round, and reactive to light. Conjunctivae and EOM are normal.  Neck: Neck supple. No thyromegaly present.  Cardiovascular: Normal rate and regular rhythm.   Pulmonary/Chest: Effort normal and breath sounds normal. He has no wheezes. He has no rales.  Abdominal: Soft. Bowel sounds are normal. He exhibits no mass. There is no hepatosplenomegaly. There is no tenderness.  Musculoskeletal: He exhibits no edema.  Lymphadenopathy:    He has no cervical adenopathy.  Neurological: He is alert and oriented to person, place, and time.  Skin: Skin is warm and dry. No rash noted.  Psychiatric: He has a normal mood and affect. His behavior is normal. Thought content normal.  Vitals reviewed.   Results for orders placed or performed in visit on 01/31/17  POCT glucose  Result Value Ref Range   POC Glucose 129 (A) 70 - 99 mg/dl      Assessment & Plan:   Encounter Diagnoses  Name Primary?  . Essential hypertension Yes  . Anemia, unspecified type   . Substance abuse   . Hypokalemia   . Abnormal kidney function   . Screening cholesterol level   . Screening for diabetes mellitus   . Screening for prostate cancer   . History of cigarette smoking   . Screen for colon cancer      -will check Baseline labs -pt is given iFOBT for colon cancer screening -rx lisinopril/hctz for htn.  Discussed that his bp is not very high today so will not put him back on all 3 of his previous meds at this time.  Will follow up in 1 month to recheck the bp and if needed, will add more at that time.  Pt is in agreement with plan

## 2017-02-02 ENCOUNTER — Other Ambulatory Visit (HOSPITAL_COMMUNITY)
Admission: RE | Admit: 2017-02-02 | Discharge: 2017-02-02 | Disposition: A | Discharge status: Home / Self Care | Payer: Self-pay | Source: Ambulatory Visit | Attending: Physician Assistant | Admitting: Physician Assistant

## 2017-02-02 DIAGNOSIS — Z13220 Encounter for screening for lipoid disorders: Secondary | ICD-10-CM | POA: Insufficient documentation

## 2017-02-02 DIAGNOSIS — D649 Anemia, unspecified: Secondary | ICD-10-CM | POA: Insufficient documentation

## 2017-02-02 DIAGNOSIS — Z125 Encounter for screening for malignant neoplasm of prostate: Secondary | ICD-10-CM | POA: Insufficient documentation

## 2017-02-02 DIAGNOSIS — Z131 Encounter for screening for diabetes mellitus: Secondary | ICD-10-CM | POA: Insufficient documentation

## 2017-02-02 DIAGNOSIS — I10 Essential (primary) hypertension: Secondary | ICD-10-CM | POA: Insufficient documentation

## 2017-02-02 LAB — LIPID PANEL
CHOL/HDL RATIO: 5.2 ratio
CHOLESTEROL: 181 mg/dL (ref 0–200)
HDL: 35 mg/dL — ABNORMAL LOW (ref >40)
LDL Cholesterol: 118 mg/dL — ABNORMAL HIGH (ref 0–99)
TRIGLYCERIDES: 138 mg/dL (ref <150)
VLDL: 28 mg/dL (ref 0–40)

## 2017-02-02 LAB — CBC
HCT: 35.3 % — ABNORMAL LOW (ref 39.0–52.0)
Hemoglobin: 12 g/dL — ABNORMAL LOW (ref 13.0–17.0)
MCH: 31.3 pg (ref 26.0–34.0)
MCHC: 34 g/dL (ref 30.0–36.0)
MCV: 92.2 fL (ref 78.0–100.0)
Platelets: 487 K/uL — ABNORMAL HIGH (ref 150–400)
RBC: 3.83 MIL/uL — ABNORMAL LOW (ref 4.22–5.81)
RDW: 13.5 % (ref 11.5–15.5)
WBC: 6.6 K/uL (ref 4.0–10.5)

## 2017-02-02 LAB — COMPREHENSIVE METABOLIC PANEL WITH GFR
ALT: 20 U/L (ref 17–63)
AST: 22 U/L (ref 15–41)
Albumin: 3.7 g/dL (ref 3.5–5.0)
Alkaline Phosphatase: 65 U/L (ref 38–126)
Anion gap: 8 (ref 5–15)
BUN: 28 mg/dL — ABNORMAL HIGH (ref 6–20)
CO2: 25 mmol/L (ref 22–32)
Calcium: 9.1 mg/dL (ref 8.9–10.3)
Chloride: 106 mmol/L (ref 101–111)
Creatinine, Ser: 1.68 mg/dL — ABNORMAL HIGH (ref 0.61–1.24)
GFR calc Af Amer: 52 mL/min — ABNORMAL LOW
GFR calc non Af Amer: 45 mL/min — ABNORMAL LOW
Glucose, Bld: 103 mg/dL — ABNORMAL HIGH (ref 65–99)
Potassium: 3.5 mmol/L (ref 3.5–5.1)
Sodium: 139 mmol/L (ref 135–145)
Total Bilirubin: 0.6 mg/dL (ref 0.3–1.2)
Total Protein: 7.1 g/dL (ref 6.5–8.1)

## 2017-02-02 LAB — PSA: Prostatic Specific Antigen: 1.78 ng/mL (ref 0.00–4.00)

## 2017-02-03 LAB — HEMOGLOBIN A1C
Hgb A1c MFr Bld: 4.9 % (ref 4.8–5.6)
MEAN PLASMA GLUCOSE: 94 mg/dL

## 2017-02-08 ENCOUNTER — Encounter: Payer: Self-pay | Admitting: Physician Assistant

## 2017-02-08 NOTE — Progress Notes (Signed)
Received iFOBT from pt.  Test invalid (too much stool)

## 2017-03-01 ENCOUNTER — Ambulatory Visit: Payer: Self-pay | Admitting: Physician Assistant

## 2017-03-02 ENCOUNTER — Encounter: Payer: Self-pay | Admitting: Physician Assistant

## 2017-03-02 ENCOUNTER — Ambulatory Visit: Payer: Self-pay | Admitting: Physician Assistant

## 2017-03-02 VITALS — BP 156/82 | HR 89 | Temp 97.7°F | Ht 70.75 in | Wt 214.8 lb

## 2017-03-02 DIAGNOSIS — N189 Chronic kidney disease, unspecified: Secondary | ICD-10-CM

## 2017-03-02 DIAGNOSIS — Z9119 Patient's noncompliance with other medical treatment and regimen: Secondary | ICD-10-CM

## 2017-03-02 DIAGNOSIS — Z91199 Patient's noncompliance with other medical treatment and regimen due to unspecified reason: Secondary | ICD-10-CM

## 2017-03-02 DIAGNOSIS — F1910 Other psychoactive substance abuse, uncomplicated: Secondary | ICD-10-CM

## 2017-03-02 DIAGNOSIS — R05 Cough: Secondary | ICD-10-CM

## 2017-03-02 DIAGNOSIS — M24541 Contracture, right hand: Secondary | ICD-10-CM

## 2017-03-02 DIAGNOSIS — I10 Essential (primary) hypertension: Secondary | ICD-10-CM

## 2017-03-02 DIAGNOSIS — D649 Anemia, unspecified: Secondary | ICD-10-CM

## 2017-03-02 DIAGNOSIS — R059 Cough, unspecified: Secondary | ICD-10-CM

## 2017-03-02 MED ORDER — LISINOPRIL-HYDROCHLOROTHIAZIDE 20-12.5 MG PO TABS: 1.0000 | ORAL_TABLET | Freq: Every day | ORAL | 0 refills | Status: DC

## 2017-03-02 MED ORDER — BENZONATATE 100 MG PO CAPS: ORAL_CAPSULE | ORAL | 1 refills | Status: DC

## 2017-03-02 MED ORDER — AMOXICILLIN 500 MG PO CAPS: 500.0000 mg | ORAL_CAPSULE | Freq: Three times a day (TID) | ORAL | 0 refills | Status: DC

## 2017-03-02 NOTE — Progress Notes (Signed)
BP (!) 156/82 (BP Location: Left Arm, Patient Position: Sitting, Cuff Size: Normal)   Pulse 89   Temp 97.7 F (36.5 C)   Ht 5' 10.75" (1.797 m)   Wt 214 lb 12 oz (97.4 kg)   SpO2 97%   BMI 30.16 kg/m    Subjective:    Patient ID: Ethan Schmidt, male    DOB: September 26, 1960, 56 y.o.   MRN: [id number]  HPI: Ethan Schmidt is a 56 y.o. male presenting on 03/02/2017 for Hypertension   HPI   -Pt ran out of his bp meds and didn't get them refilled -Pt still insists he never smoked -Hand pain x a month. Denies injury -Pt did not return his iFOBT -pt c/o cough for a few weeks  Relevant past medical, surgical, family and social history reviewed and updated as indicated. Interim medical history since our last visit reviewed. Allergies and medications reviewed and updated.   Current Outpatient Prescriptions:  .  aspirin 81 MG tablet, Take 81 mg by mouth daily., Disp: , Rfl:  .  Multiple Vitamin (MULTIVITAMIN WITH MINERALS) TABS tablet, Take 1 tablet by mouth daily., Disp: , Rfl:  .  lisinopril-hydrochlorothiazide (ZESTORETIC) 20-12.5 MG tablet, Take 1 tablet by mouth daily. (Patient not taking: Reported on 03/02/2017), Disp: 30 tablet, Rfl: 1   Review of Systems  Constitutional: Negative for appetite change, chills, diaphoresis, fatigue, fever and unexpected weight change.  HENT: Negative for congestion, drooling, ear pain, facial swelling, hearing loss, mouth sores, sneezing, sore throat, trouble swallowing and voice change.   Eyes: Negative for pain, discharge, redness, itching and visual disturbance.  Respiratory: Positive for cough. Negative for choking, shortness of breath and wheezing.   Cardiovascular: Negative for chest pain, palpitations and leg swelling.  Gastrointestinal: Negative for abdominal pain, blood in stool, constipation, diarrhea and vomiting.  Endocrine: Negative for cold intolerance, heat intolerance and polydipsia.  Genitourinary: Negative for decreased urine volume,  dysuria and hematuria.  Musculoskeletal: Positive for arthralgias. Negative for back pain and gait problem.  Skin: Negative for rash.  Allergic/Immunologic: Negative for environmental allergies.  Neurological: Negative for seizures, syncope, light-headedness and headaches.  Hematological: Negative for adenopathy.  Psychiatric/Behavioral: Negative for agitation, dysphoric mood and suicidal ideas. The patient is not nervous/anxious.     Per HPI unless specifically indicated above     Objective:    BP (!) 156/82 (BP Location: Left Arm, Patient Position: Sitting, Cuff Size: Normal)   Pulse 89   Temp 97.7 F (36.5 C)   Ht 5' 10.75" (1.797 m)   Wt 214 lb 12 oz (97.4 kg)   SpO2 97%   BMI 30.16 kg/m   Wt Readings from Last 3 Encounters:  03/02/17 214 lb 12 oz (97.4 kg)  02/01/17 208 lb 8 oz (94.6 kg)  01/31/17 208 lb 6.4 oz (94.5 kg)    Physical Exam  Constitutional: He is oriented to person, place, and time. He appears well-developed and well-nourished.  HENT:  Head: Normocephalic and atraumatic.  Right Ear: Hearing, tympanic membrane, external ear and ear canal normal.  Left Ear: Hearing, tympanic membrane, external ear and ear canal normal.  Nose: Nose normal.  Mouth/Throat: Uvula is midline and oropharynx is clear and moist. No uvula swelling. No oropharyngeal exudate, posterior oropharyngeal edema, posterior oropharyngeal erythema or tonsillar abscesses.  Neck: Neck supple.  Cardiovascular: Normal rate and regular rhythm.   Pulmonary/Chest: Effort normal and breath sounds normal. He has no wheezes.  Abdominal: Soft. Bowel sounds are normal. There is no  hepatosplenomegaly. There is no tenderness.  Musculoskeletal: He exhibits no edema.       Right hand: He exhibits deformity. He exhibits no bony tenderness, no laceration and no swelling.  R 5th finger with contracture  Lymphadenopathy:    He has no cervical adenopathy.  Neurological: He is alert and oriented to person, place,  and time.  Skin: Skin is warm and dry.  Psychiatric: He has a normal mood and affect. His behavior is normal.  Vitals reviewed.   Results for orders placed or performed during the hospital encounter of 02/02/17  CBC  Result Value Ref Range   WBC 6.6 4.0 - 10.5 K/uL   RBC 3.83 (L) 4.22 - 5.81 MIL/uL   Hemoglobin 12.0 (L) 13.0 - 17.0 g/dL   HCT 35.3 (L) 39.0 - 52.0 %   MCV 92.2 78.0 - 100.0 fL   MCH 31.3 26.0 - 34.0 pg   MCHC 34.0 30.0 - 36.0 g/dL   RDW 13.5 11.5 - 15.5 %   Platelets 487 (H) 150 - 400 K/uL  Comprehensive Metabolic Panel (CMET)  Result Value Ref Range   Sodium 139 135 - 145 mmol/L   Potassium 3.5 3.5 - 5.1 mmol/L   Chloride 106 101 - 111 mmol/L   CO2 25 22 - 32 mmol/L   Glucose, Bld 103 (H) 65 - 99 mg/dL   BUN 28 (H) 6 - 20 mg/dL   Creatinine, Ser 1.68 (H) 0.61 - 1.24 mg/dL   Calcium 9.1 8.9 - 10.3 mg/dL   Total Protein 7.1 6.5 - 8.1 g/dL   Albumin 3.7 3.5 - 5.0 g/dL   AST 22 15 - 41 U/L   ALT 20 17 - 63 U/L   Alkaline Phosphatase 65 38 - 126 U/L   Total Bilirubin 0.6 0.3 - 1.2 mg/dL   GFR calc non Af Amer 45 (L) >60 mL/min   GFR calc Af Amer 52 (L) >60 mL/min   Anion gap 8 5 - 15  Lipid Profile  Result Value Ref Range   Cholesterol 181 0 - 200 mg/dL   Triglycerides 138 <150 mg/dL   HDL 35 (L) >40 mg/dL   Total CHOL/HDL Ratio 5.2 RATIO   VLDL 28 0 - 40 mg/dL   LDL Cholesterol 118 (H) 0 - 99 mg/dL  Hemoglobin A1c  Result Value Ref Range   Hgb A1c MFr Bld 4.9 4.8 - 5.6 %   Mean Plasma Glucose 94 mg/dL  PSA  Result Value Ref Range   Prostatic Specific Antigen 1.78 0.00 - 4.00 ng/mL      Assessment & Plan:   Encounter Diagnoses  Name Primary?  . Personal history of noncompliance with medical treatment, presenting hazards to health Yes  . Essential hypertension   . Cough   . Contracture of finger joint, right   . Chronic kidney disease, unspecified CKD stage   . Anemia, unspecified type   . Substance abuse     -Reviewed labs with pt -Pt  counseled that he needs to be on his bp medication when he come in for his appointment or otherwise we won't know if it's working -counseled pt to start taking OTC iron for his anemia -rx amoxil and tessatlon for cough -refer to orthopedics fo contacture -pt was given [facility] applic[facility]ion  -follow up one month to recheck bp

## 2017-03-02 NOTE — Progress Notes (Signed)
  erroneous   Subjective:    HPI Review of Systems     Objective:     Physical Exam     Assessment & Plan:

## 2017-03-30 ENCOUNTER — Encounter: Payer: Self-pay | Admitting: Physician Assistant

## 2017-03-30 ENCOUNTER — Ambulatory Visit: Payer: Self-pay | Admitting: Physician Assistant

## 2017-03-30 VITALS — BP 150/104 | HR 74 | Temp 97.5°F | Ht 70.75 in | Wt 210.8 lb

## 2017-03-30 DIAGNOSIS — I10 Essential (primary) hypertension: Secondary | ICD-10-CM

## 2017-03-30 DIAGNOSIS — N189 Chronic kidney disease, unspecified: Secondary | ICD-10-CM

## 2017-03-30 MED ORDER — METOPROLOL TARTRATE 50 MG PO TABS
50.0000 mg | ORAL_TABLET | Freq: Two times a day (BID) | ORAL | 1 refills | Status: DC
Start: 2017-03-30 — End: 2017-07-07

## 2017-03-30 NOTE — Progress Notes (Signed)
BP (!) 150/104 (BP Location: Left Arm, Patient Position: Sitting, Cuff Size: Large)   Pulse 74   Temp (!) 97.5 F (36.4 C) (Other (Comment))   Ht 5' 10.75" (1.797 m)   Wt 210 lb 12 oz (95.6 kg)   SpO2 98%   BMI 29.60 kg/m    Subjective:    Patient ID: Ethan Schmidt, male    DOB: Mar 22, 1961, 56 y.o.   MRN: [id number]  HPI: Ethan Schmidt is a 56 y.o. male presenting on 03/30/2017 for Hypertension   HPI   Pt says he is taking his bp medication as prescribed.  Diarrhea and vomiting started yesterday.  None today.  he Ate breatkfast this am without problem.  He feels well now.   Relevant past medical, surgical, family and social history reviewed and updated as indicated. Interim medical history since our last visit reviewed. Allergies and medications reviewed and updated.   Current Outpatient Prescriptions:  .  aspirin 81 MG tablet, Take 81 mg by mouth daily., Disp: , Rfl:  .  benzonatate (TESSALON PERLES) 100 MG capsule, 1 -2 po 8 hour prn cougn, Disp: 20 capsule, Rfl: 1 .  lisinopril-hydrochlorothiazide (ZESTORETIC) 20-12.5 MG tablet, Take 1 tablet by mouth daily., Disp: 30 tablet, Rfl: 0 .  Multiple Vitamin (MULTIVITAMIN WITH MINERALS) TABS tablet, Take 1 tablet by mouth daily., Disp: , Rfl:    Review of Systems  Constitutional: Negative for appetite change, chills, diaphoresis, fatigue, fever and unexpected weight change.  HENT: Negative for congestion, dental problem, drooling, ear pain, facial swelling, hearing loss, mouth sores, sneezing, sore throat, trouble swallowing and voice change.   Eyes: Negative for pain, discharge, redness, itching and visual disturbance.  Respiratory: Negative for cough, choking, shortness of breath and wheezing.   Cardiovascular: Negative for chest pain, palpitations and leg swelling.  Gastrointestinal: Positive for diarrhea and vomiting. Negative for abdominal pain, blood in stool and constipation.  Endocrine: Negative for cold intolerance,  heat intolerance and polydipsia.  Genitourinary: Negative for decreased urine volume, dysuria and hematuria.  Musculoskeletal: Negative for arthralgias, back pain and gait problem.  Skin: Negative for rash.  Allergic/Immunologic: Negative for environmental allergies.  Neurological: Positive for headaches. Negative for seizures, syncope and light-headedness.  Hematological: Negative for adenopathy.  Psychiatric/Behavioral: Negative for agitation, dysphoric mood and suicidal ideas. The patient is not nervous/anxious.     Per HPI unless specifically indicated above     Objective:    BP (!) 150/104 (BP Location: Left Arm, Patient Position: Sitting, Cuff Size: Large)   Pulse 74   Temp (!) 97.5 F (36.4 C) (Other (Comment))   Ht 5' 10.75" (1.797 m)   Wt 210 lb 12 oz (95.6 kg)   SpO2 98%   BMI 29.60 kg/m   Wt Readings from Last 3 Encounters:  03/30/17 210 lb 12 oz (95.6 kg)  03/02/17 218 lb 12 oz (99.2 kg)  03/02/17 214 lb 12 oz (97.4 kg)    Physical Exam  Constitutional: He is oriented to person, place, and time. He appears well-developed and well-nourished.  HENT:  Head: Normocephalic and atraumatic.  Neck: Neck supple.  Cardiovascular: Normal rate and regular rhythm.   Pulmonary/Chest: Effort normal and breath sounds normal. He has no wheezes.  Abdominal: Soft. Bowel sounds are normal. There is no hepatosplenomegaly. There is no tenderness.  Musculoskeletal: He exhibits no edema.  Lymphadenopathy:    He has no cervical adenopathy.  Neurological: He is alert and oriented to person, place, and time.  Skin: Skin  is warm and dry.  Psychiatric: He has a normal mood and affect. His behavior is normal.  Vitals reviewed.       Assessment & Plan:   Encounter Diagnoses  Name Primary?  . Essential hypertension Yes  . Chronic kidney disease, unspecified CKD stage     -pt to continue current medication and add metoprolol -he is to follow up for recheck bp 1 month.  RTO sooner  prn

## 2017-04-03 ENCOUNTER — Ambulatory Visit: Payer: Self-pay | Admitting: Physician Assistant

## 2017-04-10 ENCOUNTER — Emergency Department (HOSPITAL_COMMUNITY)
Admission: EM | Admit: 2017-04-10 | Discharge: 2017-04-10 | Disposition: A | Discharge status: Home / Self Care | Payer: Self-pay | Attending: Emergency Medicine | Admitting: Emergency Medicine

## 2017-04-10 ENCOUNTER — Encounter (HOSPITAL_COMMUNITY): Payer: Self-pay | Admitting: Emergency Medicine

## 2017-04-10 ENCOUNTER — Emergency Department (HOSPITAL_COMMUNITY): Payer: Self-pay

## 2017-04-10 DIAGNOSIS — I10 Essential (primary) hypertension: Secondary | ICD-10-CM | POA: Insufficient documentation

## 2017-04-10 DIAGNOSIS — F1410 Cocaine abuse, uncomplicated: Secondary | ICD-10-CM | POA: Insufficient documentation

## 2017-04-10 DIAGNOSIS — E876 Hypokalemia: Secondary | ICD-10-CM | POA: Insufficient documentation

## 2017-04-10 DIAGNOSIS — Z79899 Other long term (current) drug therapy: Secondary | ICD-10-CM | POA: Insufficient documentation

## 2017-04-10 DIAGNOSIS — N289 Disorder of kidney and ureter, unspecified: Secondary | ICD-10-CM | POA: Insufficient documentation

## 2017-04-10 DIAGNOSIS — R197 Diarrhea, unspecified: Secondary | ICD-10-CM | POA: Insufficient documentation

## 2017-04-10 DIAGNOSIS — R079 Chest pain, unspecified: Secondary | ICD-10-CM | POA: Insufficient documentation

## 2017-04-10 DIAGNOSIS — Z7982 Long term (current) use of aspirin: Secondary | ICD-10-CM | POA: Insufficient documentation

## 2017-04-10 DIAGNOSIS — J45909 Unspecified asthma, uncomplicated: Secondary | ICD-10-CM | POA: Insufficient documentation

## 2017-04-10 LAB — CBC
HCT: 39.4 % (ref 39.0–52.0)
Hemoglobin: 13.4 g/dL (ref 13.0–17.0)
MCH: 31.2 pg (ref 26.0–34.0)
MCHC: 34 g/dL (ref 30.0–36.0)
MCV: 91.6 fL (ref 78.0–100.0)
PLATELETS: 434 10*3/uL — AB (ref 150–400)
RBC: 4.3 MIL/uL (ref 4.22–5.81)
RDW: 12.6 % (ref 11.5–15.5)
WBC: 13.8 10*3/uL — AB (ref 4.0–10.5)

## 2017-04-10 LAB — BASIC METABOLIC PANEL
ANION GAP: 10 (ref 5–15)
BUN: 24 mg/dL — ABNORMAL HIGH (ref 6–20)
CALCIUM: 9.7 mg/dL (ref 8.9–10.3)
CO2: 30 mmol/L (ref 22–32)
CREATININE: 2.5 mg/dL — AB (ref 0.61–1.24)
Chloride: 102 mmol/L (ref 101–111)
GFR calc non Af Amer: 28 mL/min — ABNORMAL LOW (ref >60)
GFR, EST AFRICAN AMERICAN: 32 mL/min — AB (ref >60)
Glucose, Bld: 115 mg/dL — ABNORMAL HIGH (ref 65–99)
Potassium: 2.9 mmol/L — ABNORMAL LOW (ref 3.5–5.1)
Sodium: 142 mmol/L (ref 135–145)

## 2017-04-10 LAB — URINALYSIS, ROUTINE W REFLEX MICROSCOPIC
GLUCOSE, UA: NEGATIVE mg/dL
Hgb urine dipstick: NEGATIVE
Ketones, ur: NEGATIVE mg/dL
LEUKOCYTES UA: NEGATIVE
Nitrite: NEGATIVE
PROTEIN: 100 mg/dL — AB
SPECIFIC GRAVITY, URINE: 1.03 (ref 1.005–1.030)
pH: 5 (ref 5.0–8.0)

## 2017-04-10 LAB — TROPONIN I

## 2017-04-10 LAB — RAPID URINE DRUG SCREEN, HOSP PERFORMED
AMPHETAMINES: NOT DETECTED
BENZODIAZEPINES: NOT DETECTED
Barbiturates: NOT DETECTED
COCAINE: POSITIVE — AB
OPIATES: NOT DETECTED
Tetrahydrocannabinol: POSITIVE — AB

## 2017-04-10 MED ORDER — SODIUM CHLORIDE 0.9 % IV BOLUS (SEPSIS)
1000.0000 mL | Freq: Once | INTRAVENOUS | Status: AC
  Administered 2017-04-10: 1000 mL via INTRAVENOUS

## 2017-04-10 MED ORDER — SODIUM CHLORIDE 0.9 % IV SOLN
INTRAVENOUS | Status: DC
  Administered 2017-04-10: 17:00:00 via INTRAVENOUS

## 2017-04-10 MED ORDER — POTASSIUM CHLORIDE CRYS ER 20 MEQ PO TBCR: 20.0000 meq | EXTENDED_RELEASE_TABLET | Freq: Two times a day (BID) | ORAL | 0 refills | Status: DC

## 2017-04-10 MED ORDER — POTASSIUM CHLORIDE CRYS ER 20 MEQ PO TBCR
40.0000 meq | EXTENDED_RELEASE_TABLET | Freq: Once | ORAL | Status: AC
  Administered 2017-04-10: 40 meq via ORAL
  Filled 2017-04-10: qty 2

## 2017-04-10 MED ORDER — KETOROLAC TROMETHAMINE 30 MG/ML IJ SOLN
30.0000 mg | Freq: Once | INTRAMUSCULAR | Status: AC
  Administered 2017-04-10: 30 mg via INTRAVENOUS
  Filled 2017-04-10: qty 1

## 2017-04-10 NOTE — ED Notes (Signed)
Patient in restroom when called for triage.

## 2017-04-10 NOTE — ED Triage Notes (Signed)
Having chest pain to left chest and left side.  Had 5 watery stools while in ED today.  Rates pain to chest 10/10.

## 2017-04-10 NOTE — ED Provider Notes (Signed)
[facility] DEPT Provider Note   CSN: [id number] Arrival date & time: 04/10/17  1231     History   Chief Complaint Chief Complaint  Patient presents with  . Chest Pain  . Diarrhea    HPI Ethan Schmidt is a 56 y.o. male. He presents for evaluation of left-sided chest pain left-sided abdominal pain and left flank pain all starting yesterday and constant.  Reports having 5 episodes of loose bowel movements, since arrival to the emergency department, about 1 hour ago.  He denies nausea, vomiting, fever, chills, productive cough, weakness or dizziness.  He has high blood pressure is taking his prescribed medications.  He denies use of medical products.  There are no other known modifying factors.   HPI  Past Medical History:  Diagnosis Date  . Arthritis   . Asthma   . Hypertension     Patient Active Problem List   Diagnosis Date Noted  . Tobacco abuse 06/29/2014  . CAP (community acquired pneumonia) 06/29/2014  . Asthma exacerbation 06/29/2014  . AKI (acute kidney injury) ([place]) 06/29/2014    Past Surgical History:  Procedure Laterality Date  . APPENDECTOMY         Home Medications    Prior to Admission medications   Medication Sig Start Date End Date Taking? Authorizing Provider  aspirin 81 MG tablet Take 81 mg by mouth daily.    [provider]  benzonatate (TESSALON PERLES) 100 MG capsule 1 -2 po 8 hour prn cougn 03/02/17   Soyla Dryer, PA-C  lisinopril-hydrochlorothiazide (ZESTORETIC) 20-12.5 MG tablet Take 1 tablet by mouth daily. 03/02/17   Soyla Dryer, PA-C  metoprolol tartrate (LOPRESSOR) 50 MG tablet Take 1 tablet (50 mg total) by mouth 2 (two) times daily. 03/30/17   Soyla Dryer, PA-C  Multiple Vitamin (MULTIVITAMIN WITH MINERALS) TABS tablet Take 1 tablet by mouth daily.    [provider]    Family History Family History  Problem Relation Age of Onset  . Heart attack Mother   . Hypertension Mother   . Heart attack  Father   . Hypertension Sister   . Diabetes Sister   . Arthritis Sister   . Hypertension Brother   . Kidney disease Brother   . Diabetes Brother   . Heart disease Brother   . Arthritis Brother   . Hypertension Brother   . Cancer Brother        colon cancer  . Arthritis Brother   . Hypertension Brother   . Arthritis Brother   . Hypertension Brother   . Arthritis Brother   . Hypertension Sister   . Diabetes Sister   . Arthritis Sister   . Hypertension Sister   . Diabetes Sister   . Arthritis Sister   . Hypertension Sister   . Diabetes Sister   . Arthritis Sister   . Hypertension Sister   . Diabetes Sister   . Arthritis Sister   . Hypertension Sister   . Diabetes Sister   . Arthritis Sister   . Hypertension Sister   . Arthritis Sister   . Hypertension Sister   . Heart attack Sister   . Arthritis Sister     Social History Social History  Substance Use Topics  . Smoking status: Never Smoker  . Smokeless tobacco: Never Used  . Alcohol use Yes     Comment: on holidays and special occ.     Allergies   Shellfish allergy and Omnipaque [iohexol]   Review of Systems Review of Systems  All other systems reviewed and are negative.    Physical Exam Updated Vital Signs BP (!) 149/86 (BP Location: Right Arm)   Pulse 63   Temp 98.3 F (36.8 C) (Oral)   Resp 18   Ht 6\' 1"  (1.854 m)   Wt 97.5 kg (215 lb)   SpO2 100%   BMI 28.37 kg/m   Physical Exam  Constitutional: He is oriented to person, place, and time. He appears well-developed and well-nourished. He appears distressed (Uncomfortable when moving.).  HENT:  Head: Normocephalic and atraumatic.  Right Ear: External ear normal.  Left Ear: External ear normal.  Eyes: Pupils are equal, round, and reactive to light. Conjunctivae and EOM are normal.  Neck: Normal range of motion and phonation normal. Neck supple.  Cardiovascular: Normal rate, regular rhythm and normal heart sounds.   Pulmonary/Chest: Effort  normal and breath sounds normal. No respiratory distress. He exhibits tenderness (Left lateral). He exhibits no bony tenderness.  Abdominal: Soft. He exhibits no distension and no mass. There is tenderness (Left upper and lower quadrants, mild). There is no rebound and no guarding.  Musculoskeletal: Normal range of motion. He exhibits no deformity.  Grimacing when sitting up from supine position.  Neurological: He is alert and oriented to person, place, and time. No cranial nerve deficit or sensory deficit. He exhibits normal muscle tone. Coordination normal.  Skin: Skin is warm, dry and intact.  Psychiatric: He has a normal mood and affect. His behavior is normal. Judgment and thought content normal.  Nursing note and vitals reviewed.    ED Treatments / Results  Labs (all labs ordered are listed, but only abnormal results are displayed) Labs Reviewed  BASIC METABOLIC PANEL - Abnormal; Notable for the following:       Result Value   Potassium 2.9 (*)    Glucose, Bld 115 (*)    BUN 24 (*)    Creatinine, Ser 2.50 (*)    GFR calc non Af Amer 28 (*)    GFR calc Af Amer 32 (*)    All other components within normal limits  CBC - Abnormal; Notable for the following:    WBC 13.8 (*)    Platelets 434 (*)    All other components within normal limits  URINALYSIS, ROUTINE W REFLEX MICROSCOPIC - Abnormal; Notable for the following:    APPearance HAZY (*)    Bilirubin Urine SMALL (*)    Protein, ur 100 (*)    Bacteria, UA RARE (*)    Squamous Epithelial / LPF 0-5 (*)    All other components within normal limits  RAPID URINE DRUG SCREEN, HOSP PERFORMED - Abnormal; Notable for the following:    Cocaine POSITIVE (*)    Tetrahydrocannabinol POSITIVE (*)    All other components within normal limits  TROPONIN I    BUN  Date Value Ref Range Status  04/10/2017 24 (H) 6 - 20 mg/dL Final  02/02/2017 28 (H) 6 - 20 mg/dL Final  12/14/2016 36 (H) 6 - 20 mg/dL Final  11/12/2016 19 6 - 20 mg/dL  Final   Creatinine, Ser  Date Value Ref Range Status  04/10/2017 2.50 (H) 0.61 - 1.24 mg/dL Final  02/02/2017 1.68 (H) 0.61 - 1.24 mg/dL Final  12/14/2016 2.41 (H) 0.61 - 1.24 mg/dL Final  11/12/2016 1.40 (H) 0.61 - 1.24 mg/dL Final     EKG  EKG Interpretation  Date/Time:  Monday April 10 2017 13:07:35 EDT Ventricular Rate:  59 PR Interval:  154 QRS  Duration: 98 QT Interval:  456 QTC Calculation: 451 R Axis:   34 Text Interpretation:  Sinus bradycardia Moderate voltage criteria for LVH, may be normal variant Borderline ECG since last tracing no significant change Confirmed by Daleen Bo [phone]) on 04/10/2017 2:12:06 PM       Radiology Dg Chest 2 View  Result Date: 04/10/2017 CLINICAL DATA:  Nonproductive cough, dizziness, abdominal pain and diarrhea. EXAM: CHEST  2 VIEW COMPARISON:  12/14/2016. FINDINGS: Trachea is midline. Heart size stable. Lungs are somewhat low in volume but clear. No pleural fluid. IMPRESSION: No acute findings. Electronically Signed   By: Lorin Picket M.D.   On: 04/10/2017 13:21    Procedures Procedures (including critical care time)  Medications Ordered in ED Medications  0.9 %  sodium chloride infusion ( Intravenous New Bag/Given 04/10/17 1720)  potassium chloride SA (K-DUR,KLOR-CON) CR tablet 40 mEq (not administered)  sodium chloride 0.9 % bolus 1,000 mL (0 mLs Intravenous Stopped 04/10/17 1720)  ketorolac (TORADOL) 30 MG/ML injection 30 mg (30 mg Intravenous Given 04/10/17 1531)     Initial Impression / Assessment and Plan / ED Course  I have reviewed the triage vital signs and the nursing notes.  Pertinent labs & imaging results that were available during my care of the patient were reviewed by me and considered in my medical decision making (see chart for details).  Clinical Course as of Apr 10 1952  Mon Apr 10, 2017  1949 Substance of abuse COCAINE: (!) POSITIVE [EW]  1949 Substance of abuse Tetrahydrocannabinol: (!) POSITIVE  [EW]  1949 Low, treated here Potassium: (!) 2.9 [EW]  1949 High Creatinine: (!) 2.50 [EW]    Clinical Course User Index [EW] Daleen Bo, MD     Patient Vitals for the past 24 hrs:  BP Temp Temp src Pulse Resp SpO2 Height Weight  04/10/17 1908 (!) 149/86 - - 63 18 100 % - -  04/10/17 1730 (!) 136/102 - - 66 14 94 % - -  04/10/17 1700 (!) 131/98 - - (!) 58 14 99 % - -  04/10/17 1630 (!) 143/91 - - (!) 58 18 100 % - -  04/10/17 1600 (!) 131/98 - - 62 16 98 % - -  04/10/17 1530 136/88 - - 60 17 97 % - -  04/10/17 1500 134/81 - - - 15 - - -  04/10/17 1305 - 98.3 F (36.8 C) Oral 61 18 100 % 6\' 1"  (1.854 m) 97.5 kg (215 lb)    7:50 PM Reevaluation with update and discussion. After initial assessment and treatment, an updated evaluation reveals he is resting comfortably at this time.  He has no further complaints.  Findings discussed with the patient all questions were answered. Penny Arrambide L     Final Clinical Impressions(s) / ED Diagnoses   Final diagnoses:  Diarrhea, unspecified type  Hypokalemia  Cocaine abuse (HCC)  Renal insufficiency    Nonspecific pain and diarrhea, with polysubstance abuse.  Recurrent renal insufficiency, obviously present after an episode of vomiting. Evaluation is otherwise reassuring. He is not vomiting.    Nursing Notes Reviewed/ Care Coordinated Applicable Imaging Reviewed Interpretation of Laboratory Data incorporated into ED treatment  The patient appears reasonably screened and/or stabilized for discharge and I doubt any other medical condition or other [facility] requiring further screening, evaluation, or treatment in the ED at this time prior to discharge.  Plan: Home Medications- Continue usual; Home Treatments- Avoid Cocaine and Marijauna; return here if the recommended treatment, does  not improve the symptoms; Recommended follow up- PCP check up in 1 week with repeat labs.    New Prescriptions New Prescriptions   No medications on  file     Daleen Bo, MD 04/10/17 2148

## 2017-04-10 NOTE — ED Notes (Signed)
Patient given Ginger ale at this time.

## 2017-04-10 NOTE — ED Notes (Signed)
Patient ambulated to bathroom with no assistance or difficulty. 

## 2017-04-10 NOTE — Discharge Instructions (Signed)
The testing indicates that you are somewhat dehydrated, and your potassium was low.  It is important to drink 2 L of water each day to improve your hydration status, and your kidney function.  Your potassium level is low likely from too low potassium in the diet, as well as being on the hydrochlorothiazide medication.  We are prescribing potassium to use for 1 week, to help build up the potassium level.  It is important to see your doctor in 1 week to have your blood potassium rechecked.  Try to eat foods which contain a lot of potassium.  Avoid using cocaine as it can worsen heart, kidney, blood problems.

## 2017-04-10 NOTE — ED Notes (Signed)
Patient in restroom at this time.

## 2017-05-01 ENCOUNTER — Ambulatory Visit: Payer: Self-pay | Admitting: Physician Assistant

## 2017-05-01 ENCOUNTER — Encounter: Payer: Self-pay | Admitting: Physician Assistant

## 2017-05-01 ENCOUNTER — Other Ambulatory Visit (HOSPITAL_COMMUNITY)
Admission: RE | Admit: 2017-05-01 | Discharge: 2017-05-01 | Disposition: A | Discharge status: Home / Self Care | Payer: Self-pay | Source: Ambulatory Visit | Attending: Physician Assistant | Admitting: Physician Assistant

## 2017-05-01 VITALS — BP 170/110 | HR 86 | Temp 97.3°F | Ht 70.75 in | Wt 216.8 lb

## 2017-05-01 DIAGNOSIS — E876 Hypokalemia: Secondary | ICD-10-CM

## 2017-05-01 DIAGNOSIS — I10 Essential (primary) hypertension: Secondary | ICD-10-CM | POA: Insufficient documentation

## 2017-05-01 DIAGNOSIS — N189 Chronic kidney disease, unspecified: Secondary | ICD-10-CM | POA: Insufficient documentation

## 2017-05-01 LAB — BASIC METABOLIC PANEL
Anion gap: 8 (ref 5–15)
BUN: 22 mg/dL — AB (ref 6–20)
CALCIUM: 9.4 mg/dL (ref 8.9–10.3)
CO2: 27 mmol/L (ref 22–32)
Chloride: 104 mmol/L (ref 101–111)
Creatinine, Ser: 1.79 mg/dL — ABNORMAL HIGH (ref 0.61–1.24)
GFR calc Af Amer: 48 mL/min — ABNORMAL LOW (ref >60)
GFR, EST NON AFRICAN AMERICAN: 41 mL/min — AB (ref >60)
GLUCOSE: 98 mg/dL (ref 65–99)
Potassium: 3.6 mmol/L (ref 3.5–5.1)
SODIUM: 139 mmol/L (ref 135–145)

## 2017-05-01 MED ORDER — LISINOPRIL 20 MG PO TABS: 20.0000 mg | ORAL_TABLET | Freq: Every day | ORAL | 3 refills | Status: DC

## 2017-05-01 NOTE — Progress Notes (Signed)
BP (!) 170/110 (BP Location: Left Arm, Patient Position: Sitting, Cuff Size: Normal)   Pulse 86   Temp (!) 97.3 F (36.3 C)   Ht 5' 10.75" (1.797 m)   Wt 216 lb 12 oz (98.3 kg)   SpO2 99%   BMI 30.44 kg/m    Subjective:    Patient ID: Ethan Schmidt, male    DOB: 09-02-1960, 56 y.o.   MRN: [id number]  HPI: Ethan Schmidt is a 56 y.o. male presenting on 05/01/2017 for Hypertension   HPI   Pt feeling well today.  He got confused when the metoprolol was added and he stopped taking his lisinopril/hctz.    Relevant past medical, surgical, family and social history reviewed and updated as indicated. Interim medical history since our last visit reviewed. Allergies and medications reviewed and updated.   Current Outpatient Prescriptions:  .  aspirin 81 MG tablet, Take 81 mg by mouth daily., Disp: , Rfl:  .  metoprolol tartrate (LOPRESSOR) 50 MG tablet, Take 1 tablet (50 mg total) by mouth 2 (two) times daily., Disp: 60 tablet, Rfl: 1 .  Multiple Vitamin (MULTIVITAMIN WITH MINERALS) TABS tablet, Take 1 tablet by mouth daily., Disp: , Rfl:  .  lisinopril-hydrochlorothiazide (ZESTORETIC) 20-12.5 MG tablet, Take 1 tablet by mouth daily. (Patient not taking: Reported on 05/01/2017), Disp: 30 tablet, Rfl: 0   Review of Systems  Constitutional: Negative for appetite change, chills, diaphoresis, fatigue, fever and unexpected weight change.  HENT: Negative for congestion, dental problem, drooling, ear pain, facial swelling, hearing loss, mouth sores, sneezing, sore throat, trouble swallowing and voice change.   Eyes: Negative for pain, discharge, redness, itching and visual disturbance.  Respiratory: Negative for cough, choking, shortness of breath and wheezing.   Cardiovascular: Negative for chest pain, palpitations and leg swelling.  Gastrointestinal: Negative for abdominal pain, blood in stool, constipation, diarrhea and vomiting.  Endocrine: Negative for cold intolerance, heat intolerance  and polydipsia.  Genitourinary: Negative for decreased urine volume, dysuria and hematuria.  Musculoskeletal: Negative for arthralgias, back pain and gait problem.  Skin: Negative for rash.  Allergic/Immunologic: Negative for environmental allergies.  Neurological: Negative for seizures, syncope, light-headedness and headaches.  Hematological: Negative for adenopathy.  Psychiatric/Behavioral: Negative for agitation, dysphoric mood and suicidal ideas. The patient is not nervous/anxious.     Per HPI unless specifically indicated above     Objective:    BP (!) 170/110 (BP Location: Left Arm, Patient Position: Sitting, Cuff Size: Normal)   Pulse 86   Temp (!) 97.3 F (36.3 C)   Ht 5' 10.75" (1.797 m)   Wt 216 lb 12 oz (98.3 kg)   SpO2 99%   BMI 30.44 kg/m   Wt Readings from Last 3 Encounters:  05/01/17 216 lb 12 oz (98.3 kg)  04/10/17 215 lb (97.5 kg)  03/30/17 210 lb 12 oz (95.6 kg)    Physical Exam  Constitutional: He is oriented to person, place, and time. He appears well-developed and well-nourished.  HENT:  Head: Normocephalic and atraumatic.  Neck: Neck supple.  Cardiovascular: Normal rate and regular rhythm.   Pulmonary/Chest: Effort normal and breath sounds normal. He has no wheezes.  Abdominal: Soft. Bowel sounds are normal. There is no hepatosplenomegaly. There is no tenderness.  Musculoskeletal: He exhibits no edema.  Lymphadenopathy:    He has no cervical adenopathy.  Neurological: He is alert and oriented to person, place, and time.  Skin: Skin is warm and dry.  Psychiatric: He has a normal mood and  affect. His behavior is normal.  Vitals reviewed.        Assessment & Plan:    Encounter Diagnoses  Name Primary?  . Essential hypertension Yes  . Hypokalemia      -Check bmp today -Change lisinopril/hctz to lisinopril due to ow potassium -follow up 1 month to recheck bp  RTO sooner prn

## 2017-05-31 ENCOUNTER — Ambulatory Visit: Payer: Self-pay | Admitting: Physician Assistant

## 2017-06-05 ENCOUNTER — Encounter: Payer: Self-pay | Admitting: Physician Assistant

## 2017-06-06 ENCOUNTER — Ambulatory Visit (INDEPENDENT_AMBULATORY_CARE_PROVIDER_SITE_OTHER): Payer: Self-pay | Admitting: Orthopaedic Surgery

## 2017-06-07 ENCOUNTER — Ambulatory Visit (INDEPENDENT_AMBULATORY_CARE_PROVIDER_SITE_OTHER): Payer: Self-pay | Admitting: Orthopaedic Surgery

## 2017-07-07 ENCOUNTER — Other Ambulatory Visit: Payer: Self-pay | Admitting: Physician Assistant

## 2017-08-01 ENCOUNTER — Ambulatory Visit (INDEPENDENT_AMBULATORY_CARE_PROVIDER_SITE_OTHER): Payer: Self-pay | Admitting: Orthopaedic Surgery

## 2017-08-16 ENCOUNTER — Ambulatory Visit (INDEPENDENT_AMBULATORY_CARE_PROVIDER_SITE_OTHER): Payer: Self-pay | Admitting: Orthopaedic Surgery

## 2017-09-13 ENCOUNTER — Emergency Department (HOSPITAL_COMMUNITY)
Admission: EM | Admit: 2017-09-13 | Discharge: 2017-09-13 | Disposition: A | Discharge status: Home / Self Care | Payer: Self-pay | Attending: Emergency Medicine | Admitting: Emergency Medicine

## 2017-09-13 ENCOUNTER — Encounter (HOSPITAL_COMMUNITY): Payer: Self-pay | Admitting: Emergency Medicine

## 2017-09-13 ENCOUNTER — Other Ambulatory Visit: Payer: Self-pay

## 2017-09-13 ENCOUNTER — Emergency Department (HOSPITAL_COMMUNITY): Payer: Self-pay

## 2017-09-13 DIAGNOSIS — Z7982 Long term (current) use of aspirin: Secondary | ICD-10-CM | POA: Insufficient documentation

## 2017-09-13 DIAGNOSIS — J45909 Unspecified asthma, uncomplicated: Secondary | ICD-10-CM | POA: Insufficient documentation

## 2017-09-13 DIAGNOSIS — R51 Headache: Secondary | ICD-10-CM | POA: Insufficient documentation

## 2017-09-13 DIAGNOSIS — R079 Chest pain, unspecified: Secondary | ICD-10-CM | POA: Insufficient documentation

## 2017-09-13 DIAGNOSIS — M791 Myalgia, unspecified site: Secondary | ICD-10-CM | POA: Insufficient documentation

## 2017-09-13 DIAGNOSIS — R112 Nausea with vomiting, unspecified: Secondary | ICD-10-CM | POA: Insufficient documentation

## 2017-09-13 DIAGNOSIS — I10 Essential (primary) hypertension: Secondary | ICD-10-CM | POA: Insufficient documentation

## 2017-09-13 DIAGNOSIS — Z79899 Other long term (current) drug therapy: Secondary | ICD-10-CM | POA: Insufficient documentation

## 2017-09-13 LAB — CBC
HCT: 41.7 % (ref 39.0–52.0)
HEMOGLOBIN: 13.5 g/dL (ref 13.0–17.0)
MCH: 30.8 pg (ref 26.0–34.0)
MCHC: 32.4 g/dL (ref 30.0–36.0)
MCV: 95 fL (ref 78.0–100.0)
Platelets: 430 10*3/uL — ABNORMAL HIGH (ref 150–400)
RBC: 4.39 MIL/uL (ref 4.22–5.81)
RDW: 12.6 % (ref 11.5–15.5)
WBC: 11.2 10*3/uL — ABNORMAL HIGH (ref 4.0–10.5)

## 2017-09-13 LAB — COMPREHENSIVE METABOLIC PANEL
ALBUMIN: 4.3 g/dL (ref 3.5–5.0)
ALK PHOS: 78 U/L (ref 38–126)
ALT: 20 U/L (ref 17–63)
AST: 25 U/L (ref 15–41)
Anion gap: 12 (ref 5–15)
BILIRUBIN TOTAL: 0.8 mg/dL (ref 0.3–1.2)
BUN: 23 mg/dL — AB (ref 6–20)
CO2: 29 mmol/L (ref 22–32)
Calcium: 9.8 mg/dL (ref 8.9–10.3)
Chloride: 98 mmol/L — ABNORMAL LOW (ref 101–111)
Creatinine, Ser: 2.11 mg/dL — ABNORMAL HIGH (ref 0.61–1.24)
GFR calc Af Amer: 39 mL/min — ABNORMAL LOW (ref >60)
GFR calc non Af Amer: 34 mL/min — ABNORMAL LOW (ref >60)
GLUCOSE: 113 mg/dL — AB (ref 65–99)
POTASSIUM: 3.2 mmol/L — AB (ref 3.5–5.1)
Sodium: 139 mmol/L (ref 135–145)
TOTAL PROTEIN: 8.1 g/dL (ref 6.5–8.1)

## 2017-09-13 LAB — I-STAT TROPONIN, ED: TROPONIN I, POC: 0.01 ng/mL (ref 0.00–0.08)

## 2017-09-13 LAB — LIPASE, BLOOD: Lipase: 22 U/L (ref 11–51)

## 2017-09-13 MED ORDER — PANTOPRAZOLE SODIUM 20 MG PO TBEC: 20.0000 mg | DELAYED_RELEASE_TABLET | Freq: Every day | ORAL | 3 refills | Status: DC

## 2017-09-13 MED ORDER — SODIUM CHLORIDE 0.9 % IV BOLUS (SEPSIS)
500.0000 mL | Freq: Once | INTRAVENOUS | Status: AC
  Administered 2017-09-13: 500 mL via INTRAVENOUS

## 2017-09-13 MED ORDER — HYDRALAZINE HCL 20 MG/ML IJ SOLN
20.0000 mg | Freq: Once | INTRAMUSCULAR | Status: AC
  Administered 2017-09-13: 20 mg via INTRAVENOUS
  Filled 2017-09-13: qty 1

## 2017-09-13 MED ORDER — ACETAMINOPHEN 325 MG PO TABS
650.0000 mg | ORAL_TABLET | Freq: Once | ORAL | Status: AC
  Administered 2017-09-13: 650 mg via ORAL
  Filled 2017-09-13: qty 2

## 2017-09-13 MED ORDER — PROMETHAZINE HCL 25 MG PO TABS: 25.0000 mg | ORAL_TABLET | Freq: Four times a day (QID) | ORAL | 0 refills | Status: DC | PRN

## 2017-09-13 NOTE — ED Triage Notes (Signed)
Pt C/O chest pain that started around 0800 Tuesday morning. Pt states he has been nauseas and has vomited around 3-4 times.

## 2017-09-13 NOTE — ED Provider Notes (Addendum)
[facility] EMERGENCY DEPARTMENT Provider Note   CSN: [id number] Arrival date & time: 09/13/17  9211     History   Chief Complaint Chief Complaint  Patient presents with  . Chest Pain    HPI Ethan Schmidt is a 57 y.o. male.  Patient presents to the emergency department with nearly 24 hours of chest pain.  Patient reports that he woke up yesterday morning with nausea and vomiting.  He vomited multiple times, then started to have generalized aches and pains, headache and noticed that his chest was hurting.  This has been continuous since onset.  He has not noticed any alleviating or exacerbating factors.  Patient does not have shortness of breath.  He is not nauseated any longer, no diaphoresis.      Past Medical History:  Diagnosis Date  . Arthritis   . Asthma   . Hypertension     Patient Active Problem List   Diagnosis Date Noted  . Tobacco abuse 06/29/2014  . CAP (community acquired pneumonia) 06/29/2014  . Asthma exacerbation 06/29/2014  . AKI (acute kidney injury) ([place]) 06/29/2014    Past Surgical History:  Procedure Laterality Date  . APPENDECTOMY         Home Medications    Prior to Admission medications   Medication Sig Start Date End Date Taking? Authorizing Provider  aspirin 81 MG tablet Take 81 mg by mouth daily.    [provider]  lisinopril (PRINIVIL,ZESTRIL) 20 MG tablet Take 1 tablet (20 mg total) by mouth daily. 05/01/17   Soyla Dryer, PA-C  metoprolol tartrate (LOPRESSOR) 50 MG tablet TAKE 1 TABLET BY MOUTH TWICE DAILY 07/10/17   Soyla Dryer, PA-C  Multiple Vitamin (MULTIVITAMIN WITH MINERALS) TABS tablet Take 1 tablet by mouth daily.    [provider]  pantoprazole (PROTONIX) 20 MG tablet Take 1 tablet (20 mg total) by mouth daily. 09/13/17   Orpah Greek, MD  promethazine (PHENERGAN) 25 MG tablet Take 1 tablet (25 mg total) by mouth every 6 (six) hours as needed for nausea or vomiting. 09/13/17   Deby Adger,  Gwenyth Allegra, MD    Family History Family History  Problem Relation Age of Onset  . Heart attack Mother   . Hypertension Mother   . Heart attack Father   . Hypertension Sister   . Diabetes Sister   . Arthritis Sister   . Hypertension Brother   . Kidney disease Brother   . Diabetes Brother   . Heart disease Brother   . Arthritis Brother   . Hypertension Brother   . Cancer Brother        colon cancer  . Arthritis Brother   . Hypertension Brother   . Arthritis Brother   . Hypertension Brother   . Arthritis Brother   . Hypertension Sister   . Diabetes Sister   . Arthritis Sister   . Hypertension Sister   . Diabetes Sister   . Arthritis Sister   . Hypertension Sister   . Diabetes Sister   . Arthritis Sister   . Hypertension Sister   . Diabetes Sister   . Arthritis Sister   . Hypertension Sister   . Diabetes Sister   . Arthritis Sister   . Hypertension Sister   . Arthritis Sister   . Hypertension Sister   . Heart attack Sister   . Arthritis Sister     Social History Social History   Tobacco Use  . Smoking status: Never Smoker  . Smokeless tobacco: Never  Used  Substance Use Topics  . Alcohol use: Yes    Comment: on holidays and special occ.  . Drug use: Yes    Types: Marijuana, Cocaine     Allergies   Shellfish allergy and Omnipaque [iohexol]   Review of Systems Review of Systems  Constitutional: Positive for fatigue.  Cardiovascular: Positive for chest pain.  Gastrointestinal: Positive for nausea and vomiting.  Musculoskeletal: Positive for myalgias.  Neurological: Positive for headaches.     Physical Exam Updated Vital Signs BP 137/85   Pulse (!) 53   Temp 97.7 F (36.5 C) (Oral)   Resp 14   Wt 97.5 kg (215 lb)   SpO2 96%   BMI 30.20 kg/m   Physical Exam  Constitutional: He is oriented to person, place, and time. He appears well-developed and well-nourished. No distress.  HENT:  Head: Normocephalic and atraumatic.  Right Ear:  Hearing normal.  Left Ear: Hearing normal.  Nose: Nose normal.  Mouth/Throat: Oropharynx is clear and moist and mucous membranes are normal.  Eyes: Conjunctivae and EOM are normal. Pupils are equal, round, and reactive to light.  Neck: Normal range of motion. Neck supple.  Cardiovascular: Regular rhythm, S1 normal and S2 normal. Exam reveals no gallop and no friction rub.  No murmur heard. Pulmonary/Chest: Effort normal and breath sounds normal. No respiratory distress. He exhibits no tenderness.  Abdominal: Soft. Normal appearance and bowel sounds are normal. There is no hepatosplenomegaly. There is no tenderness. There is no rebound, no guarding, no tenderness at McBurney's point and negative Murphy's sign. No hernia.  Musculoskeletal: Normal range of motion.  Neurological: He is alert and oriented to person, place, and time. He has normal strength. No cranial nerve deficit or sensory deficit. Coordination normal. GCS eye subscore is 4. GCS verbal subscore is 5. GCS motor subscore is 6.  Skin: Skin is warm, dry and intact. No rash noted. No cyanosis.  Psychiatric: He has a normal mood and affect. His speech is normal and behavior is normal. Thought content normal.  Nursing note and vitals reviewed.    ED Treatments / Results  Labs (all labs ordered are listed, but only abnormal results are displayed) Labs Reviewed  CBC - Abnormal; Notable for the following components:      Result Value   WBC 11.2 (*)    Platelets 430 (*)    All other components within normal limits  COMPREHENSIVE METABOLIC PANEL - Abnormal; Notable for the following components:   Potassium 3.2 (*)    Chloride 98 (*)    Glucose, Bld 113 (*)    BUN 23 (*)    Creatinine, Ser 2.11 (*)    GFR calc non Af Amer 34 (*)    GFR calc Af Amer 39 (*)    All other components within normal limits  LIPASE, BLOOD  I-STAT TROPONIN, ED    EKG  EKG Interpretation  Date/Time:  Wednesday September 13 2017 05:46:31 EDT Ventricular  Rate:  52 PR Interval:    QRS Duration: 91 QT Interval:  474 QTC Calculation: 441 R Axis:   68 Text Interpretation:  Sinus rhythm Consider left ventricular hypertrophy Borderline T abnormalities, inferior leads No significant change since last tracing Confirmed by Orpah Greek [phone]) on 09/13/2017 6:24:27 AM       Radiology Dg Chest 2 View  Result Date: 09/13/2017 CLINICAL DATA:  Chest pain and vomiting EXAM: CHEST - 2 VIEW COMPARISON:  Chest radiograph 04/10/2017 FINDINGS: The heart size and mediastinal contours  are within normal limits. Both lungs are clear. The visualized skeletal structures are unremarkable. IMPRESSION: No active cardiopulmonary disease. Electronically Signed   By: Ulyses Jarred M.D.   On: 09/13/2017 06:21    Procedures Procedures (including critical care time)  Medications Ordered in ED Medications  sodium chloride 0.9 % bolus 500 mL (500 mLs Intravenous New Bag/Given 09/13/17 0616)  hydrALAZINE (APRESOLINE) injection 20 mg (20 mg Intravenous Given 09/13/17 0616)     Initial Impression / Assessment and Plan / ED Course  I have reviewed the triage vital signs and the nursing notes.  Pertinent labs & imaging results that were available during my care of the patient were reviewed by me and considered in my medical decision making (see chart for details).     Patient presents with nearly 24 hours of chest pain.  Pain does not change with exertion.  His EKG is unchanged from previous and troponin is negative.  This is not felt to be cardiac in nature.  Pain began after vomiting, likely GI in nature possibly even chest wall strain.  He is moderately hypertensive here in the ER, but reviewing his records reveals that he has had poorly controlled hypertension for some time.  He was administered hydralazine here in the ER.  Blood pressure has significantly improved.  Chest x-ray is clear.  He does not have any mediastinal widening and he appears well, I do not  feel that there is any concern for esophageal rupture.  Patient has baseline renal insufficiency, creatinine is slightly elevated, although in the range that we have seen in the past.  He likely has some mild dehydration from the vomiting.  Was administered IV fluids here in the ER.  Patient will be treated for GI and possibly musculoskeletal chest pain and follow-up with primary care.  Final Clinical Impressions(s) / ED Diagnoses   Final diagnoses:  Chest pain, unspecified type  Essential hypertension    ED Discharge Orders        Ordered    pantoprazole (PROTONIX) 20 MG tablet  Daily     09/13/17 0636    promethazine (PHENERGAN) 25 MG tablet  Every 6 hours PRN     09/13/17 0636       Orpah Greek, MD 09/13/17 [id number]    Orpah Greek, MD 09/13/17 [phone]

## 2017-11-15 ENCOUNTER — Encounter: Payer: Self-pay | Admitting: Physician Assistant

## 2017-11-15 ENCOUNTER — Ambulatory Visit: Payer: Self-pay | Admitting: Physician Assistant

## 2017-11-15 VITALS — BP 146/95 | HR 65 | Ht 70.75 in | Wt 211.5 lb

## 2017-11-15 DIAGNOSIS — E876 Hypokalemia: Secondary | ICD-10-CM

## 2017-11-15 DIAGNOSIS — Z91199 Patient's noncompliance with other medical treatment and regimen due to unspecified reason: Secondary | ICD-10-CM

## 2017-11-15 DIAGNOSIS — I10 Essential (primary) hypertension: Secondary | ICD-10-CM

## 2017-11-15 DIAGNOSIS — E785 Hyperlipidemia, unspecified: Secondary | ICD-10-CM

## 2017-11-15 DIAGNOSIS — D649 Anemia, unspecified: Secondary | ICD-10-CM

## 2017-11-15 DIAGNOSIS — N189 Chronic kidney disease, unspecified: Secondary | ICD-10-CM

## 2017-11-15 DIAGNOSIS — Z9119 Patient's noncompliance with other medical treatment and regimen: Secondary | ICD-10-CM

## 2017-11-15 MED ORDER — METOPROLOL TARTRATE 100 MG PO TABS: 100.0000 mg | ORAL_TABLET | Freq: Two times a day (BID) | ORAL | 1 refills | Status: DC

## 2017-11-15 NOTE — Progress Notes (Signed)
BP (!) 146/95 (BP Location: Right Arm, Patient Position: Sitting, Cuff Size: Normal)   Pulse 65   Ht 5' 10.75" (1.797 m)   Wt 211 lb 8 oz (95.9 kg)   SpO2 99%   BMI 29.71 kg/m    Subjective:    Patient ID: Ethan Schmidt, male    DOB: 09/19/1960, 57 y.o.   MRN: [id number]  HPI: Ethan Schmidt is a 57 y.o. male presenting on 11/15/2017 for Follow-up   HPI   Pt was scheduled to [place] in November but he did not.  He has not been seen since October for his uncontrolled blood pressure.  Counseled with pt on why it is so dangerous to not continue with regular appointments in efforts to control his blood pressure.    Pt says he feels fine today.    Relevant past medical, surgical, family and social history reviewed and updated as indicated. Interim medical history since our last visit reviewed. Allergies and medications reviewed and updated.   Current Outpatient Medications:  .  aspirin 81 MG tablet, Take 81 mg by mouth daily., Disp: , Rfl:  .  lisinopril (PRINIVIL,ZESTRIL) 20 MG tablet, Take 1 tablet (20 mg total) by mouth daily., Disp: 30 tablet, Rfl: 3 .  metoprolol tartrate (LOPRESSOR) 50 MG tablet, TAKE 1 TABLET BY MOUTH TWICE DAILY, Disp: 60 tablet, Rfl: 1 .  Multiple Vitamin (MULTIVITAMIN WITH MINERALS) TABS tablet, Take 1 tablet by mouth daily., Disp: , Rfl:  .  pantoprazole (PROTONIX) 20 MG tablet, Take 1 tablet (20 mg total) by mouth daily. (Patient not taking: Reported on 11/15/2017), Disp: 30 tablet, Rfl: 3 .  promethazine (PHENERGAN) 25 MG tablet, Take 1 tablet (25 mg total) by mouth every 6 (six) hours as needed for nausea or vomiting. (Patient not taking: Reported on 11/15/2017), Disp: 10 tablet, Rfl: 0   Review of Systems  Constitutional: Negative for appetite change, chills, diaphoresis, fatigue, fever and unexpected weight change.  HENT: Negative for congestion, dental problem, drooling, ear pain, facial swelling, hearing loss, mouth sores, sneezing, sore throat, trouble  swallowing and voice change.   Eyes: Negative for pain, discharge, redness, itching and visual disturbance.  Respiratory: Negative for cough, choking, shortness of breath and wheezing.   Cardiovascular: Negative for chest pain, palpitations and leg swelling.  Gastrointestinal: Negative for abdominal pain, blood in stool, constipation, diarrhea and vomiting.  Endocrine: Negative for cold intolerance, heat intolerance and polydipsia.  Genitourinary: Negative for decreased urine volume, dysuria and hematuria.  Musculoskeletal: Negative for arthralgias, back pain and gait problem.  Skin: Negative for rash.  Allergic/Immunologic: Negative for environmental allergies.  Neurological: Negative for seizures, syncope, light-headedness and headaches.  Hematological: Negative for adenopathy.  Psychiatric/Behavioral: Negative for agitation, dysphoric mood and suicidal ideas. The patient is not nervous/anxious.     Per HPI unless specifically indicated above     Objective:    BP (!) 146/95 (BP Location: Right Arm, Patient Position: Sitting, Cuff Size: Normal)   Pulse 65   Ht 5' 10.75" (1.797 m)   Wt 211 lb 8 oz (95.9 kg)   SpO2 99%   BMI 29.71 kg/m   Wt Readings from Last 3 Encounters:  11/15/17 211 lb 8 oz (95.9 kg)  09/13/17 215 lb (97.5 kg)  05/01/17 216 lb 12 oz (98.3 kg)    Physical Exam  Constitutional: He is oriented to person, place, and time. He appears well-developed and well-nourished.  HENT:  Head: Normocephalic and atraumatic.  Neck: Neck supple.  Cardiovascular:  Normal rate and regular rhythm.  Pulmonary/Chest: Effort normal and breath sounds normal. He has no wheezes.  Abdominal: Soft. Bowel sounds are normal. There is no hepatosplenomegaly. There is no tenderness.  Musculoskeletal: He exhibits no edema.  Lymphadenopathy:    He has no cervical adenopathy.  Neurological: He is alert and oriented to person, place, and time.  Skin: Skin is warm and dry.  Psychiatric: He has  a normal mood and affect. His behavior is normal.  Vitals reviewed.       Assessment & Plan:   Encounter Diagnoses  Name Primary?  . Essential hypertension Yes  . Anemia, unspecified type   . Chronic kidney disease, unspecified CKD stage   . Hypokalemia   . Hyperlipidemia, unspecified hyperlipidemia type   . Personal history of noncompliance with medical treatment, presenting hazards to health     -Pt to get fasting labs drawn tomorrow morning -increased metoprolol -pt to follow up in 1 month to recheck bp and review labs.  RTO sooner prn

## 2017-11-16 ENCOUNTER — Other Ambulatory Visit (HOSPITAL_COMMUNITY)
Admission: RE | Admit: 2017-11-16 | Discharge: 2017-11-16 | Disposition: A | Discharge status: Home / Self Care | Payer: Self-pay | Source: Ambulatory Visit | Attending: Physician Assistant | Admitting: Physician Assistant

## 2017-11-16 DIAGNOSIS — E785 Hyperlipidemia, unspecified: Secondary | ICD-10-CM | POA: Insufficient documentation

## 2017-11-16 DIAGNOSIS — E876 Hypokalemia: Secondary | ICD-10-CM

## 2017-11-16 DIAGNOSIS — I10 Essential (primary) hypertension: Secondary | ICD-10-CM

## 2017-11-16 DIAGNOSIS — D649 Anemia, unspecified: Secondary | ICD-10-CM

## 2017-11-16 DIAGNOSIS — N189 Chronic kidney disease, unspecified: Secondary | ICD-10-CM

## 2017-11-16 LAB — LIPID PANEL
CHOLESTEROL: 162 mg/dL (ref 0–200)
HDL: 37 mg/dL — ABNORMAL LOW (ref >40)
LDL Cholesterol: 94 mg/dL (ref 0–99)
Total CHOL/HDL Ratio: 4.4 RATIO
Triglycerides: 157 mg/dL — ABNORMAL HIGH (ref <150)
VLDL: 31 mg/dL (ref 0–40)

## 2017-11-16 LAB — COMPREHENSIVE METABOLIC PANEL
ALBUMIN: 3.9 g/dL (ref 3.5–5.0)
ALK PHOS: 69 U/L (ref 38–126)
ALT: 22 U/L (ref 17–63)
AST: 25 U/L (ref 15–41)
Anion gap: 8 (ref 5–15)
BUN: 20 mg/dL (ref 6–20)
CHLORIDE: 104 mmol/L (ref 101–111)
CO2: 28 mmol/L (ref 22–32)
CREATININE: 1.74 mg/dL — AB (ref 0.61–1.24)
Calcium: 8.8 mg/dL — ABNORMAL LOW (ref 8.9–10.3)
GFR, EST AFRICAN AMERICAN: 50 mL/min — AB (ref >60)
GFR, EST NON AFRICAN AMERICAN: 43 mL/min — AB (ref >60)
Glucose, Bld: 98 mg/dL (ref 65–99)
POTASSIUM: 3.5 mmol/L (ref 3.5–5.1)
SODIUM: 140 mmol/L (ref 135–145)
Total Bilirubin: 0.6 mg/dL (ref 0.3–1.2)
Total Protein: 7.3 g/dL (ref 6.5–8.1)

## 2017-12-13 ENCOUNTER — Ambulatory Visit: Payer: Self-pay | Admitting: Physician Assistant

## 2017-12-18 ENCOUNTER — Encounter: Payer: Self-pay | Admitting: Physician Assistant

## 2018-03-13 ENCOUNTER — Emergency Department (HOSPITAL_COMMUNITY)
Admission: EM | Admit: 2018-03-13 | Discharge: 2018-03-14 | Disposition: A | Discharge status: Home / Self Care | Payer: Self-pay | Attending: Emergency Medicine | Admitting: Emergency Medicine

## 2018-03-13 ENCOUNTER — Other Ambulatory Visit: Payer: Self-pay

## 2018-03-13 ENCOUNTER — Encounter (HOSPITAL_COMMUNITY): Payer: Self-pay | Admitting: Emergency Medicine

## 2018-03-13 DIAGNOSIS — L089 Local infection of the skin and subcutaneous tissue, unspecified: Secondary | ICD-10-CM | POA: Insufficient documentation

## 2018-03-13 DIAGNOSIS — L729 Follicular cyst of the skin and subcutaneous tissue, unspecified: Secondary | ICD-10-CM

## 2018-03-13 DIAGNOSIS — L02212 Cutaneous abscess of back [any part, except buttock]: Secondary | ICD-10-CM | POA: Insufficient documentation

## 2018-03-13 DIAGNOSIS — Z79899 Other long term (current) drug therapy: Secondary | ICD-10-CM | POA: Insufficient documentation

## 2018-03-13 DIAGNOSIS — J45909 Unspecified asthma, uncomplicated: Secondary | ICD-10-CM | POA: Insufficient documentation

## 2018-03-13 DIAGNOSIS — I10 Essential (primary) hypertension: Secondary | ICD-10-CM | POA: Insufficient documentation

## 2018-03-13 DIAGNOSIS — Z7982 Long term (current) use of aspirin: Secondary | ICD-10-CM | POA: Insufficient documentation

## 2018-03-13 NOTE — ED Triage Notes (Signed)
Pt c/o abscess to L back for several days. No drainage.

## 2018-03-14 MED ORDER — CEPHALEXIN 500 MG PO CAPS: 500.0000 mg | ORAL_CAPSULE | Freq: Four times a day (QID) | ORAL | 0 refills | Status: DC

## 2018-03-14 MED ORDER — ONDANSETRON HCL 4 MG PO TABS
4.0000 mg | ORAL_TABLET | Freq: Once | ORAL | Status: AC
  Administered 2018-03-14: 4 mg via ORAL
  Filled 2018-03-14: qty 1

## 2018-03-14 MED ORDER — HYDROCODONE-ACETAMINOPHEN 5-325 MG PO TABS
2.0000 | ORAL_TABLET | Freq: Once | ORAL | Status: AC
  Administered 2018-03-14: 2 via ORAL
  Filled 2018-03-14: qty 2

## 2018-03-14 MED ORDER — CLINDAMYCIN HCL 150 MG PO CAPS: 150.0000 mg | ORAL_CAPSULE | Freq: Four times a day (QID) | ORAL | 0 refills | Status: DC

## 2018-03-14 MED ORDER — CEPHALEXIN 500 MG PO CAPS
500.0000 mg | ORAL_CAPSULE | Freq: Once | ORAL | Status: AC
  Administered 2018-03-14: 500 mg via ORAL
  Filled 2018-03-14: qty 1

## 2018-03-14 MED ORDER — HYDROCODONE-ACETAMINOPHEN 7.5-325 MG PO TABS: 1.0000 | ORAL_TABLET | ORAL | 0 refills | Status: DC | PRN

## 2018-03-14 MED ORDER — DOXYCYCLINE HYCLATE 100 MG PO TABS
100.0000 mg | ORAL_TABLET | Freq: Once | ORAL | Status: AC
  Administered 2018-03-14: 100 mg via ORAL
  Filled 2018-03-14: qty 1

## 2018-03-14 NOTE — Discharge Instructions (Addendum)
You have an infected cysts of your back.  We were able to remove a portion of the infection material tonight.  Please soak the area in warm Epson salt water daily until this has resolved.  Please call Dr. Arnoldo Morale for surgical management to have this completely removed.  Please use clindamycin and Keflex with breakfast, lunch, dinner, and at bedtime.  Use Tylenol extra strength for mild pain.  Use Norco for more severe pain.This medication may cause drowsiness. Please do not drink, drive, or participate in activity that requires concentration while taking this medication.  This area will probably oose and drain for the next couple of days.  Please change the dressing daily.

## 2018-03-14 NOTE — ED Provider Notes (Signed)
[facility] EMERGENCY DEPARTMENT Provider Note   CSN: [id number] Arrival date & time: 03/13/18  1953     History   Chief Complaint Chief Complaint  Patient presents with  . Abscess    HPI Ethan Schmidt is a 57 y.o. male.  Patient is a 57 year old male who presents to the emergency department with a complaint of an abscess of his back.  The patient states that he has had problems with cyst on his back for quite some time.  He now has a abscess present.  He says he did have a great deal of pain.  He denies fever, nausea, or vomiting.  The cyst area is not draining.  He presents now for assistance with this issue.  The history is provided by the patient.  Abscess    Past Medical History:  Diagnosis Date  . Arthritis   . Asthma   . Hypertension     Patient Active Problem List   Diagnosis Date Noted  . Tobacco abuse 06/29/2014  . CAP (community acquired pneumonia) 06/29/2014  . Asthma exacerbation 06/29/2014  . AKI (acute kidney injury) ([place]) 06/29/2014    Past Surgical History:  Procedure Laterality Date  . APPENDECTOMY          Home Medications    Prior to Admission medications   Medication Sig Start Date End Date Taking? Authorizing Provider  aspirin 81 MG tablet Take 81 mg by mouth daily.    [provider]  cephALEXin (KEFLEX) 500 MG capsule Take 1 capsule (500 mg total) by mouth 4 (four) times daily. 03/14/18   Lily Kocher, PA-C  clindamycin (CLEOCIN) 150 MG capsule Take 1 capsule (150 mg total) by mouth every 6 (six) hours. 03/14/18   Lily Kocher, PA-C  HYDROcodone-acetaminophen (NORCO) 7.5-325 MG tablet Take 1 tablet by mouth every 4 (four) hours as needed. 03/14/18   Lily Kocher, PA-C  lisinopril (PRINIVIL,ZESTRIL) 20 MG tablet Take 1 tablet (20 mg total) by mouth daily. 05/01/17   Soyla Dryer, PA-C  metoprolol tartrate (LOPRESSOR) 100 MG tablet Take 1 tablet (100 mg total) by mouth 2 (two) times daily. 11/15/17   Soyla Dryer,  PA-C  Multiple Vitamin (MULTIVITAMIN WITH MINERALS) TABS tablet Take 1 tablet by mouth daily.    [provider]  pantoprazole (PROTONIX) 20 MG tablet Take 1 tablet (20 mg total) by mouth daily. Patient not taking: Reported on 11/15/2017 09/13/17   Orpah Greek, MD  promethazine (PHENERGAN) 25 MG tablet Take 1 tablet (25 mg total) by mouth every 6 (six) hours as needed for nausea or vomiting. Patient not taking: Reported on 11/15/2017 09/13/17   Orpah Greek, MD    Family History Family History  Problem Relation Age of Onset  . Heart attack Mother   . Hypertension Mother   . Heart attack Father   . Hypertension Sister   . Diabetes Sister   . Arthritis Sister   . Hypertension Brother   . Kidney disease Brother   . Diabetes Brother   . Heart disease Brother   . Arthritis Brother   . Hypertension Brother   . Cancer Brother        colon cancer  . Arthritis Brother   . Hypertension Brother   . Arthritis Brother   . Hypertension Brother   . Arthritis Brother   . Hypertension Sister   . Diabetes Sister   . Arthritis Sister   . Hypertension Sister   . Diabetes Sister   . Arthritis Sister   .  Hypertension Sister   . Diabetes Sister   . Arthritis Sister   . Hypertension Sister   . Diabetes Sister   . Arthritis Sister   . Hypertension Sister   . Diabetes Sister   . Arthritis Sister   . Hypertension Sister   . Arthritis Sister   . Hypertension Sister   . Heart attack Sister   . Arthritis Sister     Social History Social History   Tobacco Use  . Smoking status: Never Smoker  . Smokeless tobacco: Never Used  Substance Use Topics  . Alcohol use: Yes    Comment: on holidays and special occ.  . Drug use: Not Currently    Types: Marijuana, Cocaine    Comment: several weeks ago     Allergies   Shellfish allergy and Omnipaque [iohexol]   Review of Systems Review of Systems  Constitutional: Negative for activity change.       All ROS Neg  except as noted in HPI  HENT: Negative for nosebleeds.   Eyes: Negative for photophobia and discharge.  Respiratory: Negative for cough, shortness of breath and wheezing.   Cardiovascular: Negative for chest pain and palpitations.  Gastrointestinal: Negative for abdominal pain and blood in stool.  Genitourinary: Negative for dysuria, frequency and hematuria.  Musculoskeletal: Negative for arthralgias, back pain and neck pain.  Skin: Negative.        Abscess of the back  Neurological: Negative for dizziness, seizures and speech difficulty.  Psychiatric/Behavioral: Negative for confusion and hallucinations.     Physical Exam Updated Vital Signs BP (!) 158/92 (BP Location: Right Arm)   Pulse 63   Temp 98.4 F (36.9 C) (Oral)   Resp 15   Ht 6\' 1"  (1.854 m)   SpO2 99%   BMI 27.90 kg/m   Physical Exam  Constitutional: He is oriented to person, place, and time. He appears well-developed and well-nourished.  Non-toxic appearance.  HENT:  Head: Normocephalic.  Right Ear: Tympanic membrane and external ear normal.  Left Ear: Tympanic membrane and external ear normal.  Eyes: Pupils are equal, round, and reactive to light. EOM and lids are normal.  Neck: Normal range of motion. Neck supple. Carotid bruit is not present.  Cardiovascular: Normal rate, regular rhythm, normal heart sounds, intact distal pulses and normal pulses.  Pulmonary/Chest: Breath sounds normal. No respiratory distress.  Abdominal: Soft. Bowel sounds are normal. There is no tenderness. There is no guarding.  Musculoskeletal: Normal range of motion. He exhibits tenderness.       Thoracic back: He exhibits tenderness.       Back:  Lymphadenopathy:       Head (right side): No submandibular adenopathy present.       Head (left side): No submandibular adenopathy present.    He has no cervical adenopathy.  Neurological: He is alert and oriented to person, place, and time. He has normal strength. No cranial nerve deficit  or sensory deficit.  Skin: Skin is warm and dry.  Psychiatric: He has a normal mood and affect. His speech is normal.  Nursing note and vitals reviewed.    ED Treatments / Results  Labs (all labs ordered are listed, but only abnormal results are displayed) Labs Reviewed  AEROBIC CULTURE (SUPERFICIAL SPECIMEN)    EKG None  Radiology No results found.  Procedures .Marland KitchenIncision and Drainage Date/Time: 03/14/2018 2:00 AM Performed by: Lily Kocher, PA-C Authorized by: Lily Kocher, PA-C   Consent:    Consent obtained:  Verbal  Consent given by:  Patient   Risks discussed:  Bleeding, incomplete drainage, infection and pain   Alternatives discussed:  Referral Universal protocol:    Procedure explained and questions answered to patient or proxy's satisfaction: yes     Immediately prior to procedure a time out was called: yes     Patient identity confirmed:  Arm band Location:    Type:  Cyst   Location:  Trunk   Trunk location:  Back Pre-procedure details:    Skin preparation:  Antiseptic wash and Chloraprep Anesthesia (see MAR for exact dosages):    Anesthesia method:  Local infiltration   Local anesthetic:  Bupivacaine 0.25% w/o epi Procedure type:    Complexity:  Simple Procedure details:    Incision types:  Single straight   Incision depth:  Subcutaneous   Scalpel blade:  11   Wound management:  Probed and deloculated   Drainage:  Bloody and purulent   Drainage amount:  Copious   Wound treatment:  Wound left open   Packing materials:  None Post-procedure details:    Patient tolerance of procedure:  Procedure terminated at patient's request Comments:     Infected cyst only partially drained.  Patient states that he did not want full anesthetic, because he says he cannot stand needles.  Patient would not allow complete break getting up of the loculations or drainage after the incision and drainage because he says he cannot take the pain, but he does not want any  additional numbing medicine injected.   (including critical care time)  Medications Ordered in ED Medications  doxycycline (VIBRA-TABS) tablet 100 mg (100 mg Oral Given 03/14/18 0020)  HYDROcodone-acetaminophen (NORCO/VICODIN) 5-325 MG per tablet 2 tablet (2 tablets Oral Given 03/14/18 0019)  ondansetron (ZOFRAN) tablet 4 mg (4 mg Oral Given 03/14/18 0020)  cephALEXin (KEFLEX) capsule 500 mg (500 mg Oral Given 03/14/18 0019)     Initial Impression / Assessment and Plan / ED Course  I have reviewed the triage vital signs and the nursing notes.  Pertinent labs & imaging results that were available during my care of the patient were reviewed by me and considered in my medical decision making (see chart for details).      Final Clinical Impressions(s) / ED Diagnoses MDM  Vital signs reviewed.  Patient has a large cyst on his left back.  Attempted to relieve some of the pressure tonight.  The patient would not allow me to completely drain the cyst.  Patient states he is terrified of needles, and would only allow me to inject a small amount of the anesthetic.  I have given the patient the number for the general surgeon and strongly encouraged him to see the general surgeon to complete the work-up.  In the meantime the patient will soak in warm Epson salt water's.  He is given a prescription for clindamycin and Keflex.  He is also given a prescription for hydrocodone to use for his pain.  The patient states that he will see the surgeon, or return to the emergency department if any changes in his condition, problems, or concerns.   Final diagnoses:  Infected cyst of skin    ED Discharge Orders         Ordered    clindamycin (CLEOCIN) 150 MG capsule  Every 6 hours     03/14/18 0139    cephALEXin (KEFLEX) 500 MG capsule  4 times daily     03/14/18 0139    HYDROcodone-acetaminophen (NORCO) 7.5-325 MG  tablet  Every 4 hours PRN     03/14/18 0139           Lily Kocher, PA-C 03/14/18  0205    Julianne Rice, MD 03/16/18 1539

## 2018-03-16 LAB — AEROBIC CULTURE  (SUPERFICIAL SPECIMEN): CULTURE: NO GROWTH

## 2018-03-16 LAB — AEROBIC CULTURE W GRAM STAIN (SUPERFICIAL SPECIMEN)

## 2019-05-08 ENCOUNTER — Emergency Department (HOSPITAL_COMMUNITY): Payer: Self-pay

## 2019-05-08 ENCOUNTER — Encounter (HOSPITAL_COMMUNITY): Payer: Self-pay

## 2019-05-08 ENCOUNTER — Other Ambulatory Visit: Payer: Self-pay

## 2019-05-08 DIAGNOSIS — J45909 Unspecified asthma, uncomplicated: Secondary | ICD-10-CM | POA: Insufficient documentation

## 2019-05-08 DIAGNOSIS — I10 Essential (primary) hypertension: Secondary | ICD-10-CM | POA: Insufficient documentation

## 2019-05-08 DIAGNOSIS — E876 Hypokalemia: Secondary | ICD-10-CM | POA: Insufficient documentation

## 2019-05-08 DIAGNOSIS — R42 Dizziness and giddiness: Secondary | ICD-10-CM | POA: Insufficient documentation

## 2019-05-08 MED ORDER — SODIUM CHLORIDE 0.9% FLUSH: 3.0000 mL | Freq: Once | INTRAVENOUS | Status: DC

## 2019-05-08 NOTE — ED Triage Notes (Signed)
Pt reports chest pain, headache, and dizziness that started two days ago. Pt reports head hurts the most and he has been out of BP meds for 2-3 weeks.

## 2019-05-09 ENCOUNTER — Emergency Department (HOSPITAL_COMMUNITY)
Admission: EM | Admit: 2019-05-09 | Discharge: 2019-05-09 | Disposition: A | Discharge status: Home / Self Care | Payer: Self-pay | Attending: Emergency Medicine | Admitting: Emergency Medicine

## 2019-05-09 DIAGNOSIS — I10 Essential (primary) hypertension: Secondary | ICD-10-CM

## 2019-05-09 DIAGNOSIS — E876 Hypokalemia: Secondary | ICD-10-CM

## 2019-05-09 LAB — BASIC METABOLIC PANEL
Anion gap: 8 (ref 5–15)
BUN: 17 mg/dL (ref 6–20)
CO2: 30 mmol/L (ref 22–32)
Calcium: 8.9 mg/dL (ref 8.9–10.3)
Chloride: 102 mmol/L (ref 98–111)
Creatinine, Ser: 2.08 mg/dL — ABNORMAL HIGH (ref 0.61–1.24)
GFR calc Af Amer: 40 mL/min — ABNORMAL LOW (ref >60)
GFR calc non Af Amer: 35 mL/min — ABNORMAL LOW (ref >60)
Glucose, Bld: 107 mg/dL — ABNORMAL HIGH (ref 70–99)
Potassium: 2.7 mmol/L — CL (ref 3.5–5.1)
Sodium: 140 mmol/L (ref 135–145)

## 2019-05-09 LAB — CBC
HCT: 35.7 % — ABNORMAL LOW (ref 39.0–52.0)
Hemoglobin: 11.4 g/dL — ABNORMAL LOW (ref 13.0–17.0)
MCH: 31.6 pg (ref 26.0–34.0)
MCHC: 31.9 g/dL (ref 30.0–36.0)
MCV: 98.9 fL (ref 80.0–100.0)
Platelets: 324 10*3/uL (ref 150–400)
RBC: 3.61 MIL/uL — ABNORMAL LOW (ref 4.22–5.81)
RDW: 13.5 % (ref 11.5–15.5)
WBC: 7.1 10*3/uL (ref 4.0–10.5)
nRBC: 0 % (ref 0.0–0.2)

## 2019-05-09 LAB — TROPONIN I (HIGH SENSITIVITY)
Troponin I (High Sensitivity): 11 ng/L (ref <18)
Troponin I (High Sensitivity): 12 ng/L (ref <18)

## 2019-05-09 MED ORDER — POTASSIUM CHLORIDE CRYS ER 20 MEQ PO TBCR
40.0000 meq | EXTENDED_RELEASE_TABLET | Freq: Once | ORAL | Status: AC
  Administered 2019-05-09: 40 meq via ORAL

## 2019-05-09 MED ORDER — AMLODIPINE BESYLATE 5 MG PO TABS
10.0000 mg | ORAL_TABLET | Freq: Once | ORAL | Status: AC
  Administered 2019-05-09: 10 mg via ORAL
  Filled 2019-05-09: qty 2

## 2019-05-09 MED ORDER — POTASSIUM CHLORIDE CRYS ER 20 MEQ PO TBCR: 40.0000 meq | EXTENDED_RELEASE_TABLET | Freq: Two times a day (BID) | ORAL | 0 refills | Status: DC

## 2019-05-09 MED ORDER — POTASSIUM CHLORIDE CRYS ER 20 MEQ PO TBCR
40.0000 meq | EXTENDED_RELEASE_TABLET | Freq: Once | ORAL | Status: AC
  Administered 2019-05-09: 40 meq via ORAL
  Filled 2019-05-09: qty 2

## 2019-05-09 MED ORDER — ACETAMINOPHEN 500 MG PO TABS
1000.0000 mg | ORAL_TABLET | Freq: Once | ORAL | Status: AC
  Administered 2019-05-09: 1000 mg via ORAL
  Filled 2019-05-09: qty 2

## 2019-05-09 MED ORDER — AMLODIPINE BESYLATE 10 MG PO TABS: 10.0000 mg | ORAL_TABLET | Freq: Every day | ORAL | 1 refills | Status: DC

## 2019-05-09 NOTE — ED Notes (Signed)
Date and time results received: 05/09/19 2:36 AM (use smartphrase ".now" to insert current time)  Test: potassium Critical Value: 2.7  Name of Provider Notified: Dr Dayna Barker  Orders Received? Or Actions Taken?: see chart

## 2019-05-09 NOTE — ED Notes (Signed)
Pt sleeping with snoring respirations- woke pt up to give medication

## 2019-05-09 NOTE — ED Notes (Signed)
Pt called with no answer- pt is sitting in lobby snoring

## 2019-05-09 NOTE — ED Provider Notes (Signed)
Emergency Department Provider Note   I have reviewed the triage vital signs and the nursing notes.   HISTORY  Chief Complaint Headache   HPI Ethan Schmidt is a 58 y.o. male who presents for multiple complaints.  Patient states he was drinking alcohol smoking marijuana multiple 4 hours ago when he had onset of a headache.  Patient states that his headache is similar to previous headaches he has had in the past especially with high blood pressure.   He denies any sympathomimetic use that he knows of.  Denies any trauma.  Headache does not seem to be improving but has not tried thing for symptoms.  Of note patient was sleeping comfortably on my arrival and awoke easily complaining of a severe headache.  He also told nursing that he has some chest pain dizziness previously but on my evaluation he denies these.  No recent fevers.  No stiff neck.  No back pain.  No other associated symptoms.  No other associated or modifying symptoms.    Past Medical History:  Diagnosis Date  . Arthritis   . Asthma   . Hypertension     Patient Active Problem List   Diagnosis Date Noted  . Tobacco abuse 06/29/2014  . CAP (community acquired pneumonia) 06/29/2014  . Asthma exacerbation 06/29/2014  . AKI (acute kidney injury) ([place]) 06/29/2014    Past Surgical History:  Procedure Laterality Date  . APPENDECTOMY      Current Outpatient Rx  . Order #: [id number] Class: Print  . Order #: [id number] Class: Historical Med  . Order #: [id number] Class: Normal  . Order #: [id number] Class: Normal  . Order #: [id number] Class: Print  . Order #: [id number] Class: Normal  . Order #: [id number] Class: Normal  . Order #: [id number] Class: Historical Med  . Order #: [id number] Class: Print  . Order #: [id number] Class: Print  . Order #: [id number] Class: Print    Allergies Shellfish allergy and Omnipaque [iohexol]  Family History  Problem Relation Age of Onset  . Heart attack Mother   . Hypertension Mother   .  Heart attack Father   . Hypertension Sister   . Diabetes Sister   . Arthritis Sister   . Hypertension Brother   . Kidney disease Brother   . Diabetes Brother   . Heart disease Brother   . Arthritis Brother   . Hypertension Brother   . Cancer Brother        colon cancer  . Arthritis Brother   . Hypertension Brother   . Arthritis Brother   . Hypertension Brother   . Arthritis Brother   . Hypertension Sister   . Diabetes Sister   . Arthritis Sister   . Hypertension Sister   . Diabetes Sister   . Arthritis Sister   . Hypertension Sister   . Diabetes Sister   . Arthritis Sister   . Hypertension Sister   . Diabetes Sister   . Arthritis Sister   . Hypertension Sister   . Diabetes Sister   . Arthritis Sister   . Hypertension Sister   . Arthritis Sister   . Hypertension Sister   . Heart attack Sister   . Arthritis Sister     Social History Social History   Tobacco Use  . Smoking status: Never Smoker  . Smokeless tobacco: Never Used  Substance Use Topics  . Alcohol use: Yes    Comment: on holidays and special occ.  . Drug use: Not on file    Comment: "on special occasions" "couple of  weeks ago used probably both"- 05/08/2019    Review of Systems  All other systems negative except as documented in the HPI. All pertinent positives and negatives as reviewed in the HPI. ____________________________________________   PHYSICAL EXAM:  VITAL SIGNS: ED Triage Vitals  Enc Vitals Group     BP 05/08/19 2316 (!) 183/111     Pulse Rate 05/08/19 2316 60     Resp 05/08/19 2316 18     Temp 05/08/19 2316 98.2 F (36.8 C)     Temp Source 05/08/19 2316 Oral     SpO2 05/08/19 2316 97 %     Weight 05/08/19 2319 220 lb 7.4 oz (100 kg)     Height 05/08/19 2319 6\' 1"  (1.854 m)     Head Circumference --      Peak Flow --      Pain Score 05/08/19 2318 10     Pain Loc --      Pain Edu? --      Excl. in [place]? --     Constitutional: Alert and oriented. Well appearing and in no  acute distress. Eyes: Conjunctivae are normal. PERRL. EOMI. Head: Atraumatic. Nose: No congestion/rhinnorhea. Mouth/Throat: Mucous membranes are moist.  Oropharynx non-erythematous. Neck: No stridor.  No meningeal signs.   Cardiovascular: Normal rate, regular rhythm. Good peripheral circulation. Grossly normal heart sounds.   Respiratory: Normal respiratory effort.  No retractions. Lungs CTAB. Gastrointestinal: Soft and nontender. No distention.  Musculoskeletal: No lower extremity tenderness nor edema. No gross deformities of extremities. Neurologic:  Normal speech and language. No gross focal neurologic deficits are appreciated.  No altered mental status, able to give full seemingly accurate history.  Face is symmetric, EOM's intact, pupils equal and reactive, vision intact, tongue and uvula midline without deviation. Upper and Lower extremity motor 5/5, intact pain perception in distal extremities, 2+ reflexes in biceps, patella and achilles tendons. Able to perform finger to nose normal with both hands. Walks without assistance or evident ataxia.   Skin:  Skin is warm, dry and intact. No rash noted.   ____________________________________________   LABS (all labs ordered are listed, but only abnormal results are displayed)  Labs Reviewed  BASIC METABOLIC PANEL - Abnormal; Notable for the following components:      Result Value   Potassium 2.7 (*)    Glucose, Bld 107 (*)    Creatinine, Ser 2.08 (*)    GFR calc non Af Amer 35 (*)    GFR calc Af Amer 40 (*)    All other components within normal limits  CBC - Abnormal; Notable for the following components:   RBC 3.61 (*)    Hemoglobin 11.4 (*)    HCT 35.7 (*)    All other components within normal limits  TROPONIN I (HIGH SENSITIVITY)  TROPONIN I (HIGH SENSITIVITY)   ____________________________________________  EKG     ____________________________________________  RADIOLOGY  Dg Chest 2 View  Result Date: 05/08/2019  CLINICAL DATA:  Chest pain EXAM: CHEST - 2 VIEW COMPARISON:  09/13/2017 FINDINGS: Heart and mediastinal contours are within normal limits. No focal opacities or effusions. No acute bony abnormality. IMPRESSION: No active cardiopulmonary disease. Electronically Signed   By: Rolm Baptise M.D.   On: 05/08/2019 23:55   Ct Head Wo Contrast  Result Date: 05/09/2019 CLINICAL DATA:  Headache EXAM: CT HEAD WITHOUT CONTRAST TECHNIQUE: Contiguous axial images were obtained from the base of the skull through the vertex without intravenous contrast. COMPARISON:  12/14/2016 FINDINGS: Brain: No acute  intracranial abnormality. Specifically, no hemorrhage, hydrocephalus, mass lesion, acute infarction, or significant intracranial injury. Vascular: No hyperdense vessel or unexpected calcification. Skull: No acute calvarial abnormality. Sinuses/Orbits: Mucosal thickening throughout the paranasal sinuses. Other: None IMPRESSION: No intracranial abnormality. Chronic sinusitis. Electronically Signed   By: Rolm Baptise M.D.   On: 05/09/2019 00:05    ____________________________________________   PROCEDURES  Procedure(s) performed:   Procedures   ____________________________________________   INITIAL IMPRESSION / ASSESSMENT AND PLAN / ED COURSE  Hypertension without obvious complication. Doubt SAH. No e/o PRES. No e/o other complication or end organ damage to suggest emergency. Will restart norvasc. Needs to reestablish PCP for follow up for this and Hypo-k.      Pertinent labs & imaging results that were available during my care of the patient were reviewed by me and considered in my medical decision making (see chart for details).  A medical screening exam was performed and I feel the patient has had an appropriate workup for their chief complaint at this time and likelihood of emergent condition existing is low. They have been counseled on decision, discharge, follow up and which symptoms necessitate  immediate return to the emergency department. They or their family verbally stated understanding and agreement with plan and discharged in stable condition.   ____________________________________________  FINAL CLINICAL IMPRESSION(S) / ED DIAGNOSES  Final diagnoses:  Hypertension, unspecified type  Hypokalemia     MEDICATIONS GIVEN DURING THIS VISIT:  Medications  sodium chloride flush (NS) 0.9 % injection 3 mL (has no administration in time range)  acetaminophen (TYLENOL) tablet 1,000 mg (1,000 mg Oral Given 05/09/19 0134)  amLODipine (NORVASC) tablet 10 mg (10 mg Oral Given 05/09/19 0134)  potassium chloride SA (KLOR-CON) CR tablet 40 mEq (40 mEq Oral Given 05/09/19 0256)  potassium chloride SA (KLOR-CON) CR tablet 40 mEq (40 mEq Oral Given 05/09/19 0500)     NEW OUTPATIENT MEDICATIONS STARTED DURING THIS VISIT:  New Prescriptions   AMLODIPINE (NORVASC) 10 MG TABLET    Take 1 tablet (10 mg total) by mouth daily.   POTASSIUM CHLORIDE SA (KLOR-CON) 20 MEQ TABLET    Take 2 tablets (40 mEq total) by mouth 2 (two) times daily for 7 days.    Note:  This note was prepared with assistance of Dragon voice recognition software. Occasional wrong-word or sound-a-like substitutions may have occurred due to the inherent limitations of voice recognition software.   Felisia Balcom, [name], MD 05/09/19 [phone]

## 2019-05-09 NOTE — ED Notes (Signed)
Pt called again, still sleeping in lobby, woke pt up to bring back to [address].

## 2019-05-09 NOTE — ED Notes (Signed)
Pt lying in bed with loud snoring respirations

## 2019-05-09 NOTE — ED Notes (Signed)
Pt given medications.

## 2019-08-25 ENCOUNTER — Emergency Department (HOSPITAL_COMMUNITY): Payer: Self-pay

## 2019-08-25 ENCOUNTER — Emergency Department (HOSPITAL_COMMUNITY)
Admission: EM | Admit: 2019-08-25 | Discharge: 2019-08-25 | Disposition: A | Discharge status: Home / Self Care | Payer: Self-pay | Attending: Emergency Medicine | Admitting: Emergency Medicine

## 2019-08-25 ENCOUNTER — Encounter (HOSPITAL_COMMUNITY): Payer: Self-pay | Admitting: Emergency Medicine

## 2019-08-25 ENCOUNTER — Other Ambulatory Visit: Payer: Self-pay

## 2019-08-25 DIAGNOSIS — R42 Dizziness and giddiness: Secondary | ICD-10-CM | POA: Insufficient documentation

## 2019-08-25 DIAGNOSIS — Z79899 Other long term (current) drug therapy: Secondary | ICD-10-CM | POA: Insufficient documentation

## 2019-08-25 DIAGNOSIS — R519 Headache, unspecified: Secondary | ICD-10-CM | POA: Insufficient documentation

## 2019-08-25 DIAGNOSIS — I10 Essential (primary) hypertension: Secondary | ICD-10-CM | POA: Insufficient documentation

## 2019-08-25 DIAGNOSIS — E876 Hypokalemia: Secondary | ICD-10-CM | POA: Insufficient documentation

## 2019-08-25 LAB — COMPREHENSIVE METABOLIC PANEL
ALT: 31 U/L (ref 0–44)
AST: 48 U/L — ABNORMAL HIGH (ref 15–41)
Albumin: 4.8 g/dL (ref 3.5–5.0)
Alkaline Phosphatase: 88 U/L (ref 38–126)
Anion gap: 13 (ref 5–15)
BUN: 22 mg/dL — ABNORMAL HIGH (ref 6–20)
CO2: 31 mmol/L (ref 22–32)
Calcium: 9.6 mg/dL (ref 8.9–10.3)
Chloride: 98 mmol/L (ref 98–111)
Creatinine, Ser: 1.86 mg/dL — ABNORMAL HIGH (ref 0.61–1.24)
GFR calc Af Amer: 46 mL/min — ABNORMAL LOW (ref >60)
GFR calc non Af Amer: 40 mL/min — ABNORMAL LOW (ref >60)
Glucose, Bld: 126 mg/dL — ABNORMAL HIGH (ref 70–99)
Potassium: 2.8 mmol/L — ABNORMAL LOW (ref 3.5–5.1)
Sodium: 142 mmol/L (ref 135–145)
Total Bilirubin: 1 mg/dL (ref 0.3–1.2)
Total Protein: 9.1 g/dL — ABNORMAL HIGH (ref 6.5–8.1)

## 2019-08-25 LAB — CBC WITH DIFFERENTIAL/PLATELET
Abs Immature Granulocytes: 0.04 10*3/uL (ref 0.00–0.07)
Basophils Absolute: 0.1 10*3/uL (ref 0.0–0.1)
Basophils Relative: 1 %
Eosinophils Absolute: 0 10*3/uL (ref 0.0–0.5)
Eosinophils Relative: 0 %
HCT: 45.7 % (ref 39.0–52.0)
Hemoglobin: 14.8 g/dL (ref 13.0–17.0)
Immature Granulocytes: 0 %
Lymphocytes Relative: 9 %
Lymphs Abs: 1.2 10*3/uL (ref 0.7–4.0)
MCH: 31.2 pg (ref 26.0–34.0)
MCHC: 32.4 g/dL (ref 30.0–36.0)
MCV: 96.2 fL (ref 80.0–100.0)
Monocytes Absolute: 0.5 10*3/uL (ref 0.1–1.0)
Monocytes Relative: 4 %
Neutro Abs: 11.3 10*3/uL — ABNORMAL HIGH (ref 1.7–7.7)
Neutrophils Relative %: 86 %
Platelets: 422 10*3/uL — ABNORMAL HIGH (ref 150–400)
RBC: 4.75 MIL/uL (ref 4.22–5.81)
RDW: 12.7 % (ref 11.5–15.5)
WBC: 13.1 10*3/uL — ABNORMAL HIGH (ref 4.0–10.5)
nRBC: 0 % (ref 0.0–0.2)

## 2019-08-25 MED ORDER — POTASSIUM CHLORIDE 10 MEQ/100ML IV SOLN
10.0000 meq | Freq: Once | INTRAVENOUS | Status: AC
  Administered 2019-08-25: 10 meq via INTRAVENOUS
  Filled 2019-08-25: qty 100

## 2019-08-25 MED ORDER — MORPHINE SULFATE (PF) 4 MG/ML IV SOLN
4.0000 mg | Freq: Once | INTRAVENOUS | Status: AC
  Administered 2019-08-25: 12:00:00 4 mg via INTRAVENOUS
  Filled 2019-08-25: qty 1

## 2019-08-25 MED ORDER — LABETALOL HCL 5 MG/ML IV SOLN
20.0000 mg | Freq: Once | INTRAVENOUS | Status: AC
  Administered 2019-08-25: 11:00:00 20 mg via INTRAVENOUS
  Filled 2019-08-25: qty 4

## 2019-08-25 MED ORDER — METOPROLOL TARTRATE 100 MG PO TABS: 100.0000 mg | ORAL_TABLET | Freq: Two times a day (BID) | ORAL | 3 refills | Status: DC

## 2019-08-25 MED ORDER — LISINOPRIL 20 MG PO TABS: 20.0000 mg | ORAL_TABLET | Freq: Every day | ORAL | 3 refills | Status: DC

## 2019-08-25 MED ORDER — AMLODIPINE BESYLATE 10 MG PO TABS: 10.0000 mg | ORAL_TABLET | Freq: Every day | ORAL | 3 refills | Status: DC

## 2019-08-25 MED ORDER — HYDROCHLOROTHIAZIDE 25 MG PO TABS: 25.0000 mg | ORAL_TABLET | Freq: Every day | ORAL | 3 refills | Status: DC

## 2019-08-25 MED ORDER — POTASSIUM CHLORIDE CRYS ER 20 MEQ PO TBCR
40.0000 meq | EXTENDED_RELEASE_TABLET | Freq: Once | ORAL | Status: AC
  Administered 2019-08-25: 12:00:00 40 meq via ORAL
  Filled 2019-08-25: qty 2

## 2019-08-25 MED ORDER — POTASSIUM CHLORIDE 10 MEQ/100ML IV SOLN
10.0000 meq | Freq: Once | INTRAVENOUS | Status: AC
  Administered 2019-08-25: 13:00:00 10 meq via INTRAVENOUS
  Filled 2019-08-25: qty 100

## 2019-08-25 MED ORDER — AMLODIPINE BESYLATE 5 MG PO TABS
10.0000 mg | ORAL_TABLET | Freq: Once | ORAL | Status: AC
  Administered 2019-08-25: 13:00:00 10 mg via ORAL
  Filled 2019-08-25: qty 2

## 2019-08-25 MED ORDER — POTASSIUM CHLORIDE ER 10 MEQ PO TBCR: 10.0000 meq | EXTENDED_RELEASE_TABLET | Freq: Every day | ORAL | 0 refills | Status: DC

## 2019-08-25 MED ORDER — METOPROLOL TARTRATE 50 MG PO TABS
100.0000 mg | ORAL_TABLET | Freq: Once | ORAL | Status: AC
  Administered 2019-08-25: 100 mg via ORAL
  Filled 2019-08-25: qty 2

## 2019-08-25 MED ORDER — ONDANSETRON HCL 4 MG/2ML IJ SOLN
4.0000 mg | Freq: Once | INTRAMUSCULAR | Status: AC
  Administered 2019-08-25: 11:00:00 4 mg via INTRAVENOUS
  Filled 2019-08-25: qty 2

## 2019-08-25 NOTE — ED Notes (Signed)
Pt O2 d/c at this time.

## 2019-08-25 NOTE — ED Notes (Signed)
ED Provider at bedside. 

## 2019-08-25 NOTE — ED Notes (Signed)
Pt O2 sat 80% on RA. Pt placed on 2L per [place]

## 2019-08-25 NOTE — ED Notes (Signed)
Pt states he has a history of high blood pressure and was on 4 different medications for his blood pressure. Pt states he has been out of these medications for at least 3 weeks since coming here from [place]. Pt states he was seeing a doctor but missed 2 appointments so was kicked out and not able to be seen.

## 2019-08-25 NOTE — Discharge Instructions (Addendum)
Take your blood pressure medicine and follow-up with your family doctor in the next couple weeks.  If you do not take your blood pressure medicine you are going to have a stroke

## 2019-08-25 NOTE — ED Notes (Signed)
Pt transported to CT ?

## 2019-08-25 NOTE — ED Provider Notes (Signed)
[facility] EMERGENCY DEPARTMENT Provider Note   CSN: [id number] Arrival date & time: 08/25/19  1034     History Chief Complaint  Patient presents with  . Dizziness    Ethan Schmidt is a 59 y.o. male.  Patient comes in complaining of a headache.  He has been out of his blood pressure medicine for a number days.  The history is provided by the patient. No language interpreter was used.  Dizziness Quality:  Head spinning and lightheadedness Severity:  Moderate Onset quality:  Sudden Timing:  Constant Progression:  Worsening Chronicity:  New Context: not when bending over   Relieved by:  Nothing Worsened by:  Nothing Associated symptoms: headaches   Associated symptoms: no chest pain and no diarrhea        Past Medical History:  Diagnosis Date  . Arthritis   . Asthma   . Hypertension     Patient Active Problem List   Diagnosis Date Noted  . Tobacco abuse 06/29/2014  . CAP (community acquired pneumonia) 06/29/2014  . Asthma exacerbation 06/29/2014  . AKI (acute kidney injury) ([place]) 06/29/2014    Past Surgical History:  Procedure Laterality Date  . APPENDECTOMY         Family History  Problem Relation Age of Onset  . Heart attack Mother   . Hypertension Mother   . Heart attack Father   . Hypertension Sister   . Diabetes Sister   . Arthritis Sister   . Hypertension Brother   . Kidney disease Brother   . Diabetes Brother   . Heart disease Brother   . Arthritis Brother   . Hypertension Brother   . Cancer Brother        colon cancer  . Arthritis Brother   . Hypertension Brother   . Arthritis Brother   . Hypertension Brother   . Arthritis Brother   . Hypertension Sister   . Diabetes Sister   . Arthritis Sister   . Hypertension Sister   . Diabetes Sister   . Arthritis Sister   . Hypertension Sister   . Diabetes Sister   . Arthritis Sister   . Hypertension Sister   . Diabetes Sister   . Arthritis Sister   . Hypertension Sister   .  Diabetes Sister   . Arthritis Sister   . Hypertension Sister   . Arthritis Sister   . Hypertension Sister   . Heart attack Sister   . Arthritis Sister     Social History   Tobacco Use  . Smoking status: Never Smoker  . Smokeless tobacco: Never Used  Substance Use Topics  . Alcohol use: Yes    Comment: on holidays and special occ.  . Drug use: Yes    Types: Marijuana, Cocaine    Home Medications Prior to Admission medications   Medication Sig Start Date End Date Taking? Authorizing Provider  aspirin 81 MG tablet Take 81 mg by mouth daily.   Yes [provider]  potassium chloride SA (KLOR-CON) 20 MEQ tablet Take 2 tablets (40 mEq total) by mouth 2 (two) times daily for 7 days. 05/09/19 08/25/19 Yes Mesner, [name], MD  amLODipine (NORVASC) 10 MG tablet Take 1 tablet (10 mg total) by mouth daily. 08/25/19   Milton Ferguson, MD  hydrochlorothiazide (HYDRODIURIL) 25 MG tablet Take 1 tablet (25 mg total) by mouth daily. 08/25/19   Milton Ferguson, MD  lisinopril (ZESTRIL) 20 MG tablet Take 1 tablet (20 mg total) by mouth daily. 08/25/19   Flara Storti,  Broadus John, MD  metoprolol tartrate (LOPRESSOR) 100 MG tablet Take 1 tablet (100 mg total) by mouth 2 (two) times daily. 08/25/19   Milton Ferguson, MD  potassium chloride (KLOR-CON) 10 MEQ tablet Take 1 tablet (10 mEq total) by mouth daily. 08/25/19   Milton Ferguson, MD    Allergies    Shellfish allergy and Omnipaque [iohexol]  Review of Systems   Review of Systems  Constitutional: Negative for appetite change and fatigue.  HENT: Negative for congestion, ear discharge and sinus pressure.   Eyes: Negative for discharge.  Respiratory: Negative for cough.   Cardiovascular: Negative for chest pain.  Gastrointestinal: Negative for abdominal pain and diarrhea.  Genitourinary: Negative for frequency and hematuria.  Musculoskeletal: Negative for back pain.  Skin: Negative for rash.  Neurological: Positive for dizziness and headaches. Negative for  seizures.  Psychiatric/Behavioral: Negative for hallucinations.    Physical Exam Updated Vital Signs BP (!) 183/96   Pulse (!) 53   Temp 98 F (36.7 C) (Oral)   Resp 16   Ht 6' (1.829 m)   Wt 104.3 kg   SpO2 99%   BMI 31.19 kg/m   Physical Exam Vitals and nursing note reviewed.  Constitutional:      Appearance: He is well-developed.  HENT:     Head: Normocephalic.     Nose: Nose normal.  Eyes:     General: No scleral icterus.    Conjunctiva/sclera: Conjunctivae normal.  Neck:     Thyroid: No thyromegaly.  Cardiovascular:     Rate and Rhythm: Normal rate and regular rhythm.     Heart sounds: No murmur. No friction rub. No gallop.   Pulmonary:     Breath sounds: No stridor. No wheezing or rales.  Chest:     Chest wall: No tenderness.  Abdominal:     General: There is no distension.     Tenderness: There is no abdominal tenderness. There is no rebound.  Musculoskeletal:        General: Normal range of motion.     Cervical back: Neck supple.  Lymphadenopathy:     Cervical: No cervical adenopathy.  Skin:    Findings: No erythema or rash.  Neurological:     Mental Status: He is alert and oriented to person, place, and time.     Motor: No abnormal muscle tone.     Coordination: Coordination normal.  Psychiatric:        Behavior: Behavior normal.     ED Results / Procedures / Treatments   Labs (all labs ordered are listed, but only abnormal results are displayed) Labs Reviewed  CBC WITH DIFFERENTIAL/PLATELET - Abnormal; Notable for the following components:      Result Value   WBC 13.1 (*)    Platelets 422 (*)    Neutro Abs 11.3 (*)    All other components within normal limits  COMPREHENSIVE METABOLIC PANEL - Abnormal; Notable for the following components:   Potassium 2.8 (*)    Glucose, Bld 126 (*)    BUN 22 (*)    Creatinine, Ser 1.86 (*)    Total Protein 9.1 (*)    AST 48 (*)    GFR calc non Af Amer 40 (*)    GFR calc Af Amer 46 (*)    All other  components within normal limits    EKG EKG Interpretation  Date/Time:  Sunday August 25 2019 10:55:59 EST Ventricular Rate:  65 PR Interval:    QRS Duration: 86 QT Interval:  469 QTC Calculation: 488 R Axis:   56 Text Interpretation: Sinus rhythm Left atrial enlargement Borderline repol abnormality, diffuse leads Borderline prolonged QT interval Baseline wander in lead(s) V1 V3 Confirmed by Milton Ferguson [phone]) on 08/25/2019 1:09:22 PM   Radiology CT Head Wo Contrast  Result Date: 08/25/2019 CLINICAL DATA:  Dizziness and headache since last night.  Vomiting. EXAM: CT HEAD WITHOUT CONTRAST TECHNIQUE: Contiguous axial images were obtained from the base of the skull through the vertex without intravenous contrast. COMPARISON:  05/08/2019 FINDINGS: Brain: No evidence of acute infarction, hemorrhage, hydrocephalus, extra-axial collection or mass lesion/mass effect. Vascular: Somewhat tortuous vessels which may be related to history of hypertension. No worrisome calcification or hyperdense vessel. Skull: Normal. Negative for fracture or focal lesion. Sinuses/Orbits: No acute finding. Improved sinus aeration compared to prior. IMPRESSION: No acute finding or explanation for symptoms. Electronically Signed   By: Monte Fantasia M.D.   On: 08/25/2019 11:30   DG Chest Portable 1 View  Result Date: 08/25/2019 CLINICAL DATA:  Weakness and dyspnea EXAM: PORTABLE CHEST 1 VIEW COMPARISON:  05/08/2019 chest radiograph. FINDINGS: Stable cardiomediastinal silhouette with top-normal heart size. No pneumothorax. No pleural effusion. Minimal scarring versus atelectasis at the right lung base. No pulmonary edema. No acute consolidative airspace disease. IMPRESSION: Minimal right basilar scarring versus atelectasis. Electronically Signed   By: Ilona Sorrel M.D.   On: 08/25/2019 11:47    Procedures Procedures (including critical care time)  Medications Ordered in ED Medications  labetalol (NORMODYNE)  injection 20 mg (20 mg Intravenous Given 08/25/19 1104)  ondansetron (ZOFRAN) injection 4 mg (4 mg Intravenous Given 08/25/19 1104)  potassium chloride SA (KLOR-CON) CR tablet 40 mEq (40 mEq Oral Given 08/25/19 1143)  potassium chloride 10 mEq in 100 mL IVPB (0 mEq Intravenous Stopped 08/25/19 1344)  potassium chloride 10 mEq in 100 mL IVPB (0 mEq Intravenous Stopped 08/25/19 1240)  morphine 4 MG/ML injection 4 mg (4 mg Intravenous Given 08/25/19 1150)  amLODipine (NORVASC) tablet 10 mg (10 mg Oral Given 08/25/19 1240)  metoprolol tartrate (LOPRESSOR) tablet 100 mg (100 mg Oral Given 08/25/19 1240)    ED Course  I have reviewed the triage vital signs and the nursing notes.  Pertinent labs & imaging results that were available during my care of the patient were reviewed by me and considered in my medical decision making (see chart for details).    CRITICAL CARE Performed by: Milton Ferguson Total critical care time: 40 minutes Critical care time was exclusive of separately billable procedures and treating other patients. Critical care was necessary to treat or prevent imminent or life-threatening deterioration. Critical care was time spent personally by me on the following activities: development of treatment plan with patient and/or surrogate as well as nursing, discussions with consultants, evaluation of patient's response to treatment, examination of patient, obtaining history from patient or surrogate, ordering and performing treatments and interventions, ordering and review of laboratory studies, ordering and review of radiographic studies, pulse oximetry and re-evaluation of patient's condition.  MDM Rules/Calculators/A&P                      Patient with hypertensive crisis and hypokalemia.  CT of the head was unremarkable.  Patient was given IV labetalol as helped his blood pressure along with potassium IV n.p.o.  Patient was given prescriptions for the for blood pressure medicines that he is  normally taken.  He was told that he needed to make sure he took his blood  pressure medicine or he would have a stroke Final Clinical Impression(s) / ED Diagnoses Final diagnoses:  Dizziness    Rx / DC Orders ED Discharge Orders         Ordered    amLODipine (NORVASC) 10 MG tablet  Daily     08/25/19 1353    hydrochlorothiazide (HYDRODIURIL) 25 MG tablet  Daily     08/25/19 1353    lisinopril (ZESTRIL) 20 MG tablet  Daily,   Status:  Discontinued     08/25/19 1353    metoprolol tartrate (LOPRESSOR) 100 MG tablet  2 times daily,   Status:  Discontinued     08/25/19 1353    potassium chloride (KLOR-CON) 10 MEQ tablet  Daily,   Status:  Discontinued     08/25/19 1353    lisinopril (ZESTRIL) 20 MG tablet  Daily     08/25/19 1355    metoprolol tartrate (LOPRESSOR) 100 MG tablet  2 times daily     08/25/19 1355    potassium chloride (KLOR-CON) 10 MEQ tablet  Daily     08/25/19 1355           Milton Ferguson, MD 08/25/19 1401

## 2019-08-25 NOTE — ED Provider Notes (Signed)
[facility] EMERGENCY DEPARTMENT Provider Note   CSN: [id number] Arrival date & time: 08/25/19  1034     History Chief Complaint  Patient presents with  . Dizziness    Ethan Schmidt is a 59 y.o. male.  Patient complains of dizziness and headache.  Patient has been out of his blood pressure medicine for a few days.  He is on for blood pressure medicine  The history is provided by the patient. No language interpreter was used.  Dizziness Quality:  Lightheadedness Severity:  Moderate Onset quality:  Sudden Timing:  Constant Progression:  Worsening Chronicity:  Recurrent Context: not when bending over   Relieved by:  Nothing Worsened by:  Nothing Associated symptoms: no chest pain, no diarrhea and no headaches        Past Medical History:  Diagnosis Date  . Arthritis   . Asthma   . Hypertension     Patient Active Problem List   Diagnosis Date Noted  . Tobacco abuse 06/29/2014  . CAP (community acquired pneumonia) 06/29/2014  . Asthma exacerbation 06/29/2014  . AKI (acute kidney injury) ([place]) 06/29/2014    Past Surgical History:  Procedure Laterality Date  . APPENDECTOMY         Family History  Problem Relation Age of Onset  . Heart attack Mother   . Hypertension Mother   . Heart attack Father   . Hypertension Sister   . Diabetes Sister   . Arthritis Sister   . Hypertension Brother   . Kidney disease Brother   . Diabetes Brother   . Heart disease Brother   . Arthritis Brother   . Hypertension Brother   . Cancer Brother        colon cancer  . Arthritis Brother   . Hypertension Brother   . Arthritis Brother   . Hypertension Brother   . Arthritis Brother   . Hypertension Sister   . Diabetes Sister   . Arthritis Sister   . Hypertension Sister   . Diabetes Sister   . Arthritis Sister   . Hypertension Sister   . Diabetes Sister   . Arthritis Sister   . Hypertension Sister   . Diabetes Sister   . Arthritis Sister   . Hypertension Sister     . Diabetes Sister   . Arthritis Sister   . Hypertension Sister   . Arthritis Sister   . Hypertension Sister   . Heart attack Sister   . Arthritis Sister     Social History   Tobacco Use  . Smoking status: Never Smoker  . Smokeless tobacco: Never Used  Substance Use Topics  . Alcohol use: Yes    Comment: on holidays and special occ.  . Drug use: Yes    Types: Marijuana, Cocaine    Home Medications Prior to Admission medications   Medication Sig Start Date End Date Taking? Authorizing Provider  aspirin 81 MG tablet Take 81 mg by mouth daily.   Yes [provider]  potassium chloride SA (KLOR-CON) 20 MEQ tablet Take 2 tablets (40 mEq total) by mouth 2 (two) times daily for 7 days. 05/09/19 08/25/19 Yes Mesner, [name], MD  amLODipine (NORVASC) 10 MG tablet Take 1 tablet (10 mg total) by mouth daily. 08/25/19   Milton Ferguson, MD  hydrochlorothiazide (HYDRODIURIL) 25 MG tablet Take 1 tablet (25 mg total) by mouth daily. 08/25/19   Milton Ferguson, MD  lisinopril (ZESTRIL) 20 MG tablet Take 1 tablet (20 mg total) by mouth daily. 08/25/19  Milton Ferguson, MD  metoprolol tartrate (LOPRESSOR) 100 MG tablet Take 1 tablet (100 mg total) by mouth 2 (two) times daily. 08/25/19   Milton Ferguson, MD  potassium chloride (KLOR-CON) 10 MEQ tablet Take 1 tablet (10 mEq total) by mouth daily. 08/25/19   Milton Ferguson, MD    Allergies    Shellfish allergy and Omnipaque [iohexol]  Review of Systems   Review of Systems  Constitutional: Negative for appetite change and fatigue.  HENT: Negative for congestion, ear discharge and sinus pressure.   Eyes: Negative for discharge.  Respiratory: Negative for cough.   Cardiovascular: Negative for chest pain.  Gastrointestinal: Negative for abdominal pain and diarrhea.  Genitourinary: Negative for frequency and hematuria.  Musculoskeletal: Negative for back pain.  Skin: Negative for rash.  Neurological: Positive for dizziness. Negative for seizures  and headaches.  Psychiatric/Behavioral: Negative for hallucinations.    Physical Exam Updated Vital Signs BP (!) 183/96   Pulse (!) 53   Temp 98 F (36.7 C) (Oral)   Resp 16   Ht 6' (1.829 m)   Wt 104.3 kg   SpO2 99%   BMI 31.19 kg/m   Physical Exam Vitals and nursing note reviewed.  Constitutional:      Appearance: He is well-developed.  HENT:     Head: Normocephalic.     Nose: Nose normal.  Eyes:     General: No scleral icterus.    Conjunctiva/sclera: Conjunctivae normal.  Neck:     Thyroid: No thyromegaly.  Cardiovascular:     Rate and Rhythm: Normal rate and regular rhythm.     Heart sounds: No murmur. No friction rub. No gallop.   Pulmonary:     Breath sounds: No stridor. No wheezing or rales.  Chest:     Chest wall: No tenderness.  Abdominal:     General: There is no distension.     Tenderness: There is no abdominal tenderness. There is no rebound.  Musculoskeletal:        General: Normal range of motion.     Cervical back: Neck supple.  Lymphadenopathy:     Cervical: No cervical adenopathy.  Skin:    Findings: No erythema or rash.  Neurological:     Mental Status: He is alert and oriented to person, place, and time.     Motor: No abnormal muscle tone.     Coordination: Coordination normal.  Psychiatric:        Behavior: Behavior normal.     ED Results / Procedures / Treatments   Labs (all labs ordered are listed, but only abnormal results are displayed) Labs Reviewed  CBC WITH DIFFERENTIAL/PLATELET - Abnormal; Notable for the following components:      Result Value   WBC 13.1 (*)    Platelets 422 (*)    Neutro Abs 11.3 (*)    All other components within normal limits  COMPREHENSIVE METABOLIC PANEL - Abnormal; Notable for the following components:   Potassium 2.8 (*)    Glucose, Bld 126 (*)    BUN 22 (*)    Creatinine, Ser 1.86 (*)    Total Protein 9.1 (*)    AST 48 (*)    GFR calc non Af Amer 40 (*)    GFR calc Af Amer 46 (*)    All  other components within normal limits    EKG EKG Interpretation  Date/Time:  Sunday August 25 2019 10:55:59 EST Ventricular Rate:  65 PR Interval:    QRS Duration: 86 QT Interval:  469 QTC Calculation: 488 R Axis:   56 Text Interpretation: Sinus rhythm Left atrial enlargement Borderline repol abnormality, diffuse leads Borderline prolonged QT interval Baseline wander in lead(s) V1 V3 Confirmed by Milton Ferguson [phone]) on 08/25/2019 1:09:22 PM   Radiology CT Head Wo Contrast  Result Date: 08/25/2019 CLINICAL DATA:  Dizziness and headache since last night.  Vomiting. EXAM: CT HEAD WITHOUT CONTRAST TECHNIQUE: Contiguous axial images were obtained from the base of the skull through the vertex without intravenous contrast. COMPARISON:  05/08/2019 FINDINGS: Brain: No evidence of acute infarction, hemorrhage, hydrocephalus, extra-axial collection or mass lesion/mass effect. Vascular: Somewhat tortuous vessels which may be related to history of hypertension. No worrisome calcification or hyperdense vessel. Skull: Normal. Negative for fracture or focal lesion. Sinuses/Orbits: No acute finding. Improved sinus aeration compared to prior. IMPRESSION: No acute finding or explanation for symptoms. Electronically Signed   By: Monte Fantasia M.D.   On: 08/25/2019 11:30   DG Chest Portable 1 View  Result Date: 08/25/2019 CLINICAL DATA:  Weakness and dyspnea EXAM: PORTABLE CHEST 1 VIEW COMPARISON:  05/08/2019 chest radiograph. FINDINGS: Stable cardiomediastinal silhouette with top-normal heart size. No pneumothorax. No pleural effusion. Minimal scarring versus atelectasis at the right lung base. No pulmonary edema. No acute consolidative airspace disease. IMPRESSION: Minimal right basilar scarring versus atelectasis. Electronically Signed   By: Ilona Sorrel M.D.   On: 08/25/2019 11:47    Procedures Procedures (including critical care time)  Medications Ordered in ED Medications  labetalol (NORMODYNE)  injection 20 mg (20 mg Intravenous Given 08/25/19 1104)  ondansetron (ZOFRAN) injection 4 mg (4 mg Intravenous Given 08/25/19 1104)  potassium chloride SA (KLOR-CON) CR tablet 40 mEq (40 mEq Oral Given 08/25/19 1143)  potassium chloride 10 mEq in 100 mL IVPB (0 mEq Intravenous Stopped 08/25/19 1344)  potassium chloride 10 mEq in 100 mL IVPB (0 mEq Intravenous Stopped 08/25/19 1240)  morphine 4 MG/ML injection 4 mg (4 mg Intravenous Given 08/25/19 1150)  amLODipine (NORVASC) tablet 10 mg (10 mg Oral Given 08/25/19 1240)  metoprolol tartrate (LOPRESSOR) tablet 100 mg (100 mg Oral Given 08/25/19 1240)    ED Course  I have reviewed the triage vital signs and the nursing notes.  Pertinent labs & imaging results that were available during my care of the patient were reviewed by me and considered in my medical decision making (see chart for details).    MDM Rules/Calculators/A&P                      CRITICAL CARE Performed by: Milton Ferguson Total critical care time: 45 minutes Critical care time was exclusive of separately billable procedures and treating other patients. Critical care was necessary to treat or prevent imminent or life-threatening deterioration. Critical care was time spent personally by me on the following activities: development of treatment plan with patient and/or surrogate as well as nursing, discussions with consultants, evaluation of patient's response to treatment, examination of patient, obtaining history from patient or surrogate, ordering and performing treatments and interventions, ordering and review of laboratory studies, ordering and review of radiographic studies, pulse oximetry and re-evaluation of patient's condition. Patient has significant hypertension that improved with IV labetalol.  Patient was feeling better when his blood pressure came down and CT scan was negative.  He is discharged home and given 4 prescriptions for his blood pressure medicine Final Clinical  Impression(s) / ED Diagnoses Final diagnoses:  Dizziness    Rx / DC Orders ED Discharge Orders  Ordered    amLODipine (NORVASC) 10 MG tablet  Daily     08/25/19 1353    hydrochlorothiazide (HYDRODIURIL) 25 MG tablet  Daily     08/25/19 1353    lisinopril (ZESTRIL) 20 MG tablet  Daily     08/25/19 1353    metoprolol tartrate (LOPRESSOR) 100 MG tablet  2 times daily     08/25/19 1353    potassium chloride (KLOR-CON) 10 MEQ tablet  Daily     08/25/19 1353           Milton Ferguson, MD 08/26/19 1548

## 2019-08-25 NOTE — ED Triage Notes (Signed)
Pt states he has felt dizzy with a headache since last night. Started vomiting on arrival to ED. Is very vague with symptoms and just reports "I don't feel well."

## 2019-08-25 NOTE — ED Notes (Signed)
Discharge instructions reviewed with patient. Pt instructed and counseled on importance of follow up care with PCP and taking BP meds. Pt also informed of signs and symptoms of stroke and importance of seeking medical care immediately if symptoms start. Pt verbalized understanding. Pt given handouts of PCP providers in area accepting new patients as well as brochures for [facility] and [facility] Pt also given Good RX card for help with getting medications filled.

## 2019-11-08 ENCOUNTER — Emergency Department (HOSPITAL_COMMUNITY): Payer: Medicaid - Out of State

## 2019-11-08 ENCOUNTER — Other Ambulatory Visit: Payer: Self-pay

## 2019-11-08 ENCOUNTER — Encounter (HOSPITAL_COMMUNITY): Payer: Self-pay | Admitting: *Deleted

## 2019-11-08 ENCOUNTER — Inpatient Hospital Stay (HOSPITAL_COMMUNITY)
Admission: EM | Admit: 2019-11-08 | Discharge: 2019-11-10 | DRG: 178 | Disposition: A | Discharge status: Home / Self Care | Payer: Medicaid - Out of State | Attending: Internal Medicine | Admitting: Internal Medicine

## 2019-11-08 DIAGNOSIS — A0839 Other viral enteritis: Secondary | ICD-10-CM | POA: Diagnosis present

## 2019-11-08 DIAGNOSIS — Z833 Family history of diabetes mellitus: Secondary | ICD-10-CM

## 2019-11-08 DIAGNOSIS — R112 Nausea with vomiting, unspecified: Secondary | ICD-10-CM | POA: Diagnosis present

## 2019-11-08 DIAGNOSIS — M1990 Unspecified osteoarthritis, unspecified site: Secondary | ICD-10-CM | POA: Diagnosis present

## 2019-11-08 DIAGNOSIS — Z7982 Long term (current) use of aspirin: Secondary | ICD-10-CM

## 2019-11-08 DIAGNOSIS — J45909 Unspecified asthma, uncomplicated: Secondary | ICD-10-CM | POA: Diagnosis present

## 2019-11-08 DIAGNOSIS — E876 Hypokalemia: Secondary | ICD-10-CM | POA: Diagnosis present

## 2019-11-08 DIAGNOSIS — Z8261 Family history of arthritis: Secondary | ICD-10-CM

## 2019-11-08 DIAGNOSIS — N1831 Chronic kidney disease, stage 3a: Secondary | ICD-10-CM | POA: Diagnosis present

## 2019-11-08 DIAGNOSIS — Z91041 Radiographic dye allergy status: Secondary | ICD-10-CM

## 2019-11-08 DIAGNOSIS — E860 Dehydration: Secondary | ICD-10-CM | POA: Diagnosis present

## 2019-11-08 DIAGNOSIS — N179 Acute kidney failure, unspecified: Secondary | ICD-10-CM | POA: Diagnosis present

## 2019-11-08 DIAGNOSIS — Z91013 Allergy to seafood: Secondary | ICD-10-CM

## 2019-11-08 DIAGNOSIS — Z841 Family history of disorders of kidney and ureter: Secondary | ICD-10-CM

## 2019-11-08 DIAGNOSIS — I129 Hypertensive chronic kidney disease with stage 1 through stage 4 chronic kidney disease, or unspecified chronic kidney disease: Secondary | ICD-10-CM | POA: Diagnosis present

## 2019-11-08 DIAGNOSIS — Z8249 Family history of ischemic heart disease and other diseases of the circulatory system: Secondary | ICD-10-CM

## 2019-11-08 DIAGNOSIS — U071 COVID-19: Secondary | ICD-10-CM

## 2019-11-08 DIAGNOSIS — N183 Chronic kidney disease, stage 3 unspecified: Secondary | ICD-10-CM | POA: Diagnosis present

## 2019-11-08 LAB — HEPATIC FUNCTION PANEL
ALT: 26 U/L (ref 0–44)
AST: 54 U/L — ABNORMAL HIGH (ref 15–41)
Albumin: 3.9 g/dL (ref 3.5–5.0)
Alkaline Phosphatase: 72 U/L (ref 38–126)
Bilirubin, Direct: <0.1 mg/dL (ref 0.0–0.2)
Total Bilirubin: 0.8 mg/dL (ref 0.3–1.2)
Total Protein: 8.2 g/dL — ABNORMAL HIGH (ref 6.5–8.1)

## 2019-11-08 LAB — CBC
HCT: 40.4 % (ref 39.0–52.0)
Hemoglobin: 13.3 g/dL (ref 13.0–17.0)
MCH: 31.4 pg (ref 26.0–34.0)
MCHC: 32.9 g/dL (ref 30.0–36.0)
MCV: 95.5 fL (ref 80.0–100.0)
Platelets: 294 10*3/uL (ref 150–400)
RBC: 4.23 MIL/uL (ref 4.22–5.81)
RDW: 12.5 % (ref 11.5–15.5)
WBC: 8 10*3/uL (ref 4.0–10.5)
nRBC: 0 % (ref 0.0–0.2)

## 2019-11-08 LAB — BASIC METABOLIC PANEL
Anion gap: 16 — ABNORMAL HIGH (ref 5–15)
BUN: 19 mg/dL (ref 6–20)
CO2: 27 mmol/L (ref 22–32)
Calcium: 8.8 mg/dL — ABNORMAL LOW (ref 8.9–10.3)
Chloride: 94 mmol/L — ABNORMAL LOW (ref 98–111)
Creatinine, Ser: 2.44 mg/dL — ABNORMAL HIGH (ref 0.61–1.24)
GFR calc Af Amer: 33 mL/min — ABNORMAL LOW (ref >60)
GFR calc non Af Amer: 28 mL/min — ABNORMAL LOW (ref >60)
Glucose, Bld: 138 mg/dL — ABNORMAL HIGH (ref 70–99)
Potassium: 3.2 mmol/L — ABNORMAL LOW (ref 3.5–5.1)
Sodium: 137 mmol/L (ref 135–145)

## 2019-11-08 LAB — TROPONIN I (HIGH SENSITIVITY)
Troponin I (High Sensitivity): 39 ng/L — ABNORMAL HIGH (ref <18)
Troponin I (High Sensitivity): 43 ng/L — ABNORMAL HIGH (ref <18)

## 2019-11-08 LAB — RESPIRATORY PANEL BY RT PCR (FLU A&B, COVID)
Influenza A by PCR: NEGATIVE
Influenza B by PCR: NEGATIVE
SARS Coronavirus 2 by RT PCR: POSITIVE — AB

## 2019-11-08 LAB — HIV ANTIBODY (ROUTINE TESTING W REFLEX): HIV Screen 4th Generation wRfx: NONREACTIVE

## 2019-11-08 MED ORDER — LOPERAMIDE HCL 2 MG PO CAPS: 2.0000 mg | ORAL_CAPSULE | ORAL | Status: DC | PRN

## 2019-11-08 MED ORDER — ACETAMINOPHEN 650 MG RE SUPP: 650.0000 mg | Freq: Four times a day (QID) | RECTAL | Status: DC | PRN

## 2019-11-08 MED ORDER — ONDANSETRON HCL 4 MG PO TABS: 4.0000 mg | ORAL_TABLET | Freq: Four times a day (QID) | ORAL | Status: DC | PRN

## 2019-11-08 MED ORDER — METOPROLOL TARTRATE 50 MG PO TABS
100.0000 mg | ORAL_TABLET | Freq: Two times a day (BID) | ORAL | Status: DC
  Administered 2019-11-08 – 2019-11-10 (×4): 100 mg via ORAL
  Filled 2019-11-08 (×4): qty 2

## 2019-11-08 MED ORDER — KETOROLAC TROMETHAMINE 30 MG/ML IJ SOLN
30.0000 mg | Freq: Once | INTRAMUSCULAR | Status: AC
  Administered 2019-11-08: 30 mg via INTRAVENOUS
  Filled 2019-11-08: qty 1

## 2019-11-08 MED ORDER — SODIUM CHLORIDE 0.9 % IV BOLUS
1000.0000 mL | Freq: Once | INTRAVENOUS | Status: AC
  Administered 2019-11-08: 1000 mL via INTRAVENOUS

## 2019-11-08 MED ORDER — POTASSIUM CHLORIDE ER 10 MEQ PO TBCR
10.0000 meq | EXTENDED_RELEASE_TABLET | Freq: Every day | ORAL | Status: DC
  Administered 2019-11-09 – 2019-11-10 (×2): 10 meq via ORAL
  Filled 2019-11-08 (×5): qty 1

## 2019-11-08 MED ORDER — ACETAMINOPHEN 325 MG PO TABS
650.0000 mg | ORAL_TABLET | Freq: Four times a day (QID) | ORAL | Status: DC | PRN
  Administered 2019-11-09: 650 mg via ORAL
  Filled 2019-11-08: qty 2

## 2019-11-08 MED ORDER — ONDANSETRON HCL 4 MG/2ML IJ SOLN
4.0000 mg | Freq: Once | INTRAMUSCULAR | Status: AC
  Administered 2019-11-08: 4 mg via INTRAVENOUS
  Filled 2019-11-08: qty 2

## 2019-11-08 MED ORDER — ALBUTEROL SULFATE HFA 108 (90 BASE) MCG/ACT IN AERS
4.0000 | INHALATION_SPRAY | Freq: Once | RESPIRATORY_TRACT | Status: AC
  Administered 2019-11-08: 4 via RESPIRATORY_TRACT
  Filled 2019-11-08: qty 6.7

## 2019-11-08 MED ORDER — POTASSIUM CHLORIDE CRYS ER 20 MEQ PO TBCR: 40.0000 meq | EXTENDED_RELEASE_TABLET | Freq: Once | ORAL | Status: DC

## 2019-11-08 MED ORDER — ALBUTEROL SULFATE HFA 108 (90 BASE) MCG/ACT IN AERS: 1.0000 | INHALATION_SPRAY | RESPIRATORY_TRACT | Status: DC | PRN

## 2019-11-08 MED ORDER — ASPIRIN 81 MG PO CHEW
81.0000 mg | CHEWABLE_TABLET | Freq: Every day | ORAL | Status: DC
  Administered 2019-11-08 – 2019-11-10 (×3): 81 mg via ORAL
  Filled 2019-11-08 (×3): qty 1

## 2019-11-08 MED ORDER — HEPARIN SODIUM (PORCINE) 5000 UNIT/ML IJ SOLN
5000.0000 [IU] | Freq: Three times a day (TID) | INTRAMUSCULAR | Status: DC
  Administered 2019-11-08 – 2019-11-10 (×5): 5000 [IU] via SUBCUTANEOUS
  Filled 2019-11-08 (×5): qty 1

## 2019-11-08 MED ORDER — SODIUM CHLORIDE 0.9 % IV SOLN: INTRAVENOUS | Status: DC

## 2019-11-08 MED ORDER — ONDANSETRON HCL 4 MG/2ML IJ SOLN: 4.0000 mg | Freq: Four times a day (QID) | INTRAMUSCULAR | Status: DC | PRN

## 2019-11-08 MED ORDER — SODIUM CHLORIDE 0.9% FLUSH
3.0000 mL | Freq: Once | INTRAVENOUS | Status: AC
  Administered 2019-11-08: 3 mL via INTRAVENOUS

## 2019-11-08 MED ORDER — POTASSIUM CHLORIDE CRYS ER 20 MEQ PO TBCR
40.0000 meq | EXTENDED_RELEASE_TABLET | Freq: Once | ORAL | Status: AC
  Administered 2019-11-08: 40 meq via ORAL
  Filled 2019-11-08: qty 2

## 2019-11-08 MED ORDER — AMLODIPINE BESYLATE 5 MG PO TABS
10.0000 mg | ORAL_TABLET | Freq: Every day | ORAL | Status: DC
  Administered 2019-11-08 – 2019-11-10 (×3): 10 mg via ORAL
  Filled 2019-11-08 (×3): qty 2

## 2019-11-08 NOTE — ED Triage Notes (Signed)
Pt c/o chest pain and sob that started yesterday; pt has a productive cough with white sputum

## 2019-11-08 NOTE — H&P (Signed)
History and Physical    Ethan Schmidt [id number] DOB: Nov 20, 1960 DOA: 11/08/2019  PCP: Patient, No Pcp Per   Patient coming from: Home  Chief Complaint: N/V/D along with dyspnea  HPI: Ethan Schmidt is a 59 y.o. male with medical history significant for hypertension, asthma, and CKD stage IIIa who presented to the ED with complaints of ongoing nausea, vomiting, and diarrhea over the last 3 to 4 days.  He does also have some mild wheezing and dyspnea over a similar period of time.  He denies any fevers, chills, abdominal pain, or any chest pain.  He denies any sick contacts and states that he has only been able to keep down some soup occasionally over the last few days.   ED Course: Stable vital signs noted and patient is afebrile.  He is currently on room air with saturations of 98% noted.  Creatinine is 2.44 with baseline levels of 1.8-2 noted on review.  His EKG shows sinus rhythm and troponins have been flat trending.  He denies any chest pain.  Chest x-ray is clear and KUB with some stool noted.  Potassium is 3.2.  He is noted to be Covid positive.  Review of Systems: All others reviewed and otherwise negative except as noted above.  Past Medical History:  Diagnosis Date  . Arthritis   . Asthma   . Hypertension     Past Surgical History:  Procedure Laterality Date  . APPENDECTOMY       reports that he has never smoked. He has never used smokeless tobacco. He reports current alcohol use. He reports current drug use. Drugs: Marijuana and Cocaine.  Allergies  Allergen Reactions  . Shellfish Allergy Anaphylaxis  . Omnipaque [Iohexol] Hives and Swelling    Pt. Did fine with 1 hour pre medication.    Family History  Problem Relation Age of Onset  . Heart attack Mother   . Hypertension Mother   . Heart attack Father   . Hypertension Sister   . Diabetes Sister   . Arthritis Sister   . Hypertension Brother   . Kidney disease Brother   . Diabetes Brother   . Heart disease  Brother   . Arthritis Brother   . Hypertension Brother   . Cancer Brother        colon cancer  . Arthritis Brother   . Hypertension Brother   . Arthritis Brother   . Hypertension Brother   . Arthritis Brother   . Hypertension Sister   . Diabetes Sister   . Arthritis Sister   . Hypertension Sister   . Diabetes Sister   . Arthritis Sister   . Hypertension Sister   . Diabetes Sister   . Arthritis Sister   . Hypertension Sister   . Diabetes Sister   . Arthritis Sister   . Hypertension Sister   . Diabetes Sister   . Arthritis Sister   . Hypertension Sister   . Arthritis Sister   . Hypertension Sister   . Heart attack Sister   . Arthritis Sister     Prior to Admission medications   Medication Sig Start Date End Date Taking? Authorizing Provider  amLODipine (NORVASC) 10 MG tablet Take 1 tablet (10 mg total) by mouth daily. 08/25/19  Yes Milton Ferguson, MD  aspirin 81 MG tablet Take 81 mg by mouth daily.   Yes [provider]  hydrochlorothiazide (HYDRODIURIL) 25 MG tablet Take 1 tablet (25 mg total) by mouth daily. 08/25/19  Yes Milton Ferguson, MD  lisinopril (ZESTRIL) 20 MG tablet Take 1 tablet (20 mg total) by mouth daily. 08/25/19  Yes Milton Ferguson, MD  metoprolol tartrate (LOPRESSOR) 100 MG tablet Take 1 tablet (100 mg total) by mouth 2 (two) times daily. 08/25/19  Yes Milton Ferguson, MD  potassium chloride (KLOR-CON) 10 MEQ tablet Take 1 tablet (10 mEq total) by mouth daily. 08/25/19  Yes Milton Ferguson, MD  potassium chloride SA (KLOR-CON) 20 MEQ tablet Take 2 tablets (40 mEq total) by mouth 2 (two) times daily for 7 days. 05/09/19 08/25/19  Mesner, [name], MD    Physical Exam: Vitals:   11/08/19 1230 11/08/19 1300 11/08/19 1330 11/08/19 1400  BP: (!) 137/96 135/77 125/85 120/85  Pulse: 78 80 65 71  Resp:  20 20   Temp:      TempSrc:      SpO2: 93% 96% 94% 93%  Weight:      Height:        Constitutional: NAD, calm, comfortable Vitals:   11/08/19 1230  11/08/19 1300 11/08/19 1330 11/08/19 1400  BP: (!) 137/96 135/77 125/85 120/85  Pulse: 78 80 65 71  Resp:  20 20   Temp:      TempSrc:      SpO2: 93% 96% 94% 93%  Weight:      Height:       Eyes: lids and conjunctivae normal ENMT: Mucous membranes are moist.  Neck: normal, supple Respiratory: clear to auscultation bilaterally. Normal respiratory effort. No accessory muscle use.  Currently on room air. Cardiovascular: Regular rate and rhythm, no murmurs. No extremity edema. Abdomen: no tenderness, no distention. Bowel sounds positive.  Musculoskeletal:  No joint deformity upper and lower extremities.   Skin: no rashes, lesions, ulcers.  Psychiatric: Normal judgment and insight. Alert and oriented x 3. Normal mood.   Labs on Admission: I have personally reviewed following labs and imaging studies  CBC: Recent Labs  Lab 11/08/19 1048  WBC 8.0  HGB 13.3  HCT 40.4  MCV 95.5  PLT 387   Basic Metabolic Panel: Recent Labs  Lab 11/08/19 1048  NA 137  K 3.2*  CL 94*  CO2 27  GLUCOSE 138*  BUN 19  CREATININE 2.44*  CALCIUM 8.8*   GFR: Estimated Creatinine Clearance: 43.5 mL/min (A) (by C-G formula based on SCr of 2.44 mg/dL (H)). Liver Function Tests: Recent Labs  Lab 11/08/19 1048  AST 54*  ALT 26  ALKPHOS 72  BILITOT 0.8  PROT 8.2*  ALBUMIN 3.9   No results for input(s): LIPASE, AMYLASE in the last 168 hours. No results for input(s): AMMONIA in the last 168 hours. Coagulation Profile: No results for input(s): INR, PROTIME in the last 168 hours. Cardiac Enzymes: No results for input(s): CKTOTAL, CKMB, CKMBINDEX, TROPONINI in the last 168 hours. BNP (last 3 results) No results for input(s): PROBNP in the last 8760 hours. HbA1C: No results for input(s): HGBA1C in the last 72 hours. CBG: No results for input(s): GLUCAP in the last 168 hours. Lipid Profile: No results for input(s): CHOL, HDL, LDLCALC, TRIG, CHOLHDL, LDLDIRECT in the last 72 hours. Thyroid  Function Tests: No results for input(s): TSH, T4TOTAL, FREET4, T3FREE, THYROIDAB in the last 72 hours. Anemia Panel: No results for input(s): VITAMINB12, FOLATE, FERRITIN, TIBC, IRON, RETICCTPCT in the last 72 hours. Urine analysis:    Component Value Date/Time   COLORURINE YELLOW 04/10/2017 1907   APPEARANCEUR HAZY (A) 04/10/2017 1907   LABSPEC 1.030 04/10/2017 1907   PHURINE 5.0 04/10/2017 1907  GLUCOSEU NEGATIVE 04/10/2017 1907   HGBUR NEGATIVE 04/10/2017 1907   BILIRUBINUR SMALL (A) 04/10/2017 1907   KETONESUR NEGATIVE 04/10/2017 1907   PROTEINUR 100 (A) 04/10/2017 1907   UROBILINOGEN 0.2 11/23/2011 0130   NITRITE NEGATIVE 04/10/2017 1907   LEUKOCYTESUR NEGATIVE 04/10/2017 1907    Radiological Exams on Admission: DG ABD ACUTE 2+V W 1V CHEST  Result Date: 11/08/2019 CLINICAL DATA:  Chest pain and shortness of breath.  Diarrhea. EXAM: DG ABDOMEN ACUTE W/ 1V CHEST COMPARISON:  Abdomen series December 17, 2009; chest radiograph August 25, 2019 FINDINGS: PA chest: Lungs are clear. Heart size and pulmonary vascularity are normal. No adenopathy. Supine and upright abdomen: There is moderate stool in the colon. There is no bowel dilatation or air-fluid level to suggest bowel obstruction. No free air. IMPRESSION: No bowel obstruction or free air. Moderate stool in colon. No edema or airspace opacity. Electronically Signed   By: Lowella Grip III M.D.   On: 11/08/2019 11:55    EKG: Independently reviewed. SR 98bpm.  Assessment/Plan Active Problems:   AKI (acute kidney injury) ([place])    AKI on CKD stage IIIa -Likely prerenal secondary to poor oral intake as well as ongoing vomiting and diarrhea -Maintain on IV fluid and recheck a.m. labs -Strict I's and O's and avoid nephrotoxic agents to include home HCTZ and lisinopril  Intractable nausea and vomiting along with diarrhea likely secondary to Covid -Patient will be given Zofran and Imodium as needed to help with symptomatic  management -Clear liquid diet and advance as tolerated  Mild hypokalemia -Replete and reevaluate in a.m.  History of hypertension-stable -Continue on amlodipine and metoprolol -Hold lisinopril and HCTZ given AKI  History of asthma -Albuterol inhaler treatments as needed -No significant hypoxemia noted at this time -Chest x-ray clear and no signs of Covid pneumonia noted   DVT prophylaxis: Heparin Code Status: Full Family Communication: None at bedside Disposition Plan:Admit for IVF hydration and diet advancement Consults called:None Admission status: Observation, MedSurg   Nichollas Perusse D Manuella Ghazi DO [facility]  If 7PM-7AM, please contact night-coverage [email or web address]  11/08/2019, 3:10 PM

## 2019-11-08 NOTE — ED Provider Notes (Signed)
[facility] EMERGENCY DEPARTMENT Provider Note   CSN: [id number] Arrival date & time: 11/08/19  1007     History Chief Complaint  Patient presents with  . Chest Pain    Ethan Schmidt is a 59 y.o. male.  Patient complains of vomiting diarrhea and shortness of breath for last 3 to 4 days.  Patient has a history of hypertension and asthma  The history is provided by the patient. No language interpreter was used.  Weakness Severity:  Moderate Onset quality:  Sudden Timing:  Constant Progression:  Worsening Chronicity:  New Context: not alcohol use   Relieved by:  Nothing Worsened by:  Nothing Ineffective treatments:  None tried Associated symptoms: abdominal pain, diarrhea and vomiting   Associated symptoms: no chest pain, no cough, no frequency, no headaches and no seizures        Past Medical History:  Diagnosis Date  . Arthritis   . Asthma   . Hypertension     Patient Active Problem List   Diagnosis Date Noted  . Tobacco abuse 06/29/2014  . CAP (community acquired pneumonia) 06/29/2014  . Asthma exacerbation 06/29/2014  . AKI (acute kidney injury) ([place]) 06/29/2014    Past Surgical History:  Procedure Laterality Date  . APPENDECTOMY         Family History  Problem Relation Age of Onset  . Heart attack Mother   . Hypertension Mother   . Heart attack Father   . Hypertension Sister   . Diabetes Sister   . Arthritis Sister   . Hypertension Brother   . Kidney disease Brother   . Diabetes Brother   . Heart disease Brother   . Arthritis Brother   . Hypertension Brother   . Cancer Brother        colon cancer  . Arthritis Brother   . Hypertension Brother   . Arthritis Brother   . Hypertension Brother   . Arthritis Brother   . Hypertension Sister   . Diabetes Sister   . Arthritis Sister   . Hypertension Sister   . Diabetes Sister   . Arthritis Sister   . Hypertension Sister   . Diabetes Sister   . Arthritis Sister   . Hypertension Sister   .  Diabetes Sister   . Arthritis Sister   . Hypertension Sister   . Diabetes Sister   . Arthritis Sister   . Hypertension Sister   . Arthritis Sister   . Hypertension Sister   . Heart attack Sister   . Arthritis Sister     Social History   Tobacco Use  . Smoking status: Never Smoker  . Smokeless tobacco: Never Used  Substance Use Topics  . Alcohol use: Yes    Comment: on holidays and special occ.  . Drug use: Yes    Types: Marijuana, Cocaine    Home Medications Prior to Admission medications   Medication Sig Start Date End Date Taking? Authorizing Provider  amLODipine (NORVASC) 10 MG tablet Take 1 tablet (10 mg total) by mouth daily. 08/25/19  Yes Milton Ferguson, MD  aspirin 81 MG tablet Take 81 mg by mouth daily.   Yes [provider]  hydrochlorothiazide (HYDRODIURIL) 25 MG tablet Take 1 tablet (25 mg total) by mouth daily. 08/25/19  Yes Milton Ferguson, MD  lisinopril (ZESTRIL) 20 MG tablet Take 1 tablet (20 mg total) by mouth daily. 08/25/19  Yes Milton Ferguson, MD  metoprolol tartrate (LOPRESSOR) 100 MG tablet Take 1 tablet (100 mg total) by mouth  2 (two) times daily. 08/25/19  Yes Milton Ferguson, MD  potassium chloride (KLOR-CON) 10 MEQ tablet Take 1 tablet (10 mEq total) by mouth daily. 08/25/19  Yes Milton Ferguson, MD  potassium chloride SA (KLOR-CON) 20 MEQ tablet Take 2 tablets (40 mEq total) by mouth 2 (two) times daily for 7 days. 05/09/19 08/25/19  Mesner, [name], MD    Allergies    Shellfish allergy and Omnipaque [iohexol]  Review of Systems   Review of Systems  Constitutional: Negative for appetite change and fatigue.  HENT: Negative for congestion, ear discharge and sinus pressure.   Eyes: Negative for discharge.  Respiratory: Negative for cough.   Cardiovascular: Negative for chest pain.  Gastrointestinal: Positive for abdominal pain, diarrhea and vomiting.  Genitourinary: Negative for frequency and hematuria.  Musculoskeletal: Negative for back pain.    Skin: Negative for rash.  Neurological: Positive for weakness. Negative for seizures and headaches.  Psychiatric/Behavioral: Negative for hallucinations.    Physical Exam Updated Vital Signs BP 120/85   Pulse 71   Temp 98.9 F (37.2 C) (Oral)   Resp 20   Ht 6\' 2"  (1.88 m)   Wt 104.3 kg   SpO2 93%   BMI 29.53 kg/m   Physical Exam Vitals and nursing note reviewed.  Constitutional:      Appearance: He is well-developed.  HENT:     Head: Normocephalic.     Nose: Nose normal.  Eyes:     General: No scleral icterus.    Conjunctiva/sclera: Conjunctivae normal.  Neck:     Thyroid: No thyromegaly.  Cardiovascular:     Rate and Rhythm: Normal rate and regular rhythm.     Heart sounds: No murmur. No friction rub. No gallop.   Pulmonary:     Breath sounds: No stridor. Wheezing present. No rales.  Chest:     Chest wall: No tenderness.  Abdominal:     General: There is no distension.     Tenderness: There is no abdominal tenderness. There is no rebound.  Musculoskeletal:        General: Normal range of motion.     Cervical back: Neck supple.  Lymphadenopathy:     Cervical: No cervical adenopathy.  Skin:    Findings: No erythema or rash.  Neurological:     Mental Status: He is alert and oriented to person, place, and time.     Motor: No abnormal muscle tone.     Coordination: Coordination normal.  Psychiatric:        Behavior: Behavior normal.     ED Results / Procedures / Treatments   Labs (all labs ordered are listed, but only abnormal results are displayed) Labs Reviewed  RESPIRATORY PANEL BY RT PCR (FLU A&B, COVID) - Abnormal; Notable for the following components:      Result Value   SARS Coronavirus 2 by RT PCR POSITIVE (*)    All other components within normal limits  BASIC METABOLIC PANEL - Abnormal; Notable for the following components:   Potassium 3.2 (*)    Chloride 94 (*)    Glucose, Bld 138 (*)    Creatinine, Ser 2.44 (*)    Calcium 8.8 (*)    GFR  calc non Af Amer 28 (*)    GFR calc Af Amer 33 (*)    Anion gap 16 (*)    All other components within normal limits  HEPATIC FUNCTION PANEL - Abnormal; Notable for the following components:   Total Protein 8.2 (*)  AST 54 (*)    All other components within normal limits  TROPONIN I (HIGH SENSITIVITY) - Abnormal; Notable for the following components:   Troponin I (High Sensitivity) 39 (*)    All other components within normal limits  TROPONIN I (HIGH SENSITIVITY) - Abnormal; Notable for the following components:   Troponin I (High Sensitivity) 43 (*)    All other components within normal limits  CBC    EKG EKG Interpretation  Date/Time:  Friday Nov 08 2019 10:22:30 EDT Ventricular Rate:  98 PR Interval:    QRS Duration: 80 QT Interval:  337 QTC Calculation: 431 R Axis:   50 Text Interpretation: Sinus rhythm Borderline repolarization abnormality Confirmed by Milton Ferguson [phone]) on 11/08/2019 11:02:11 AM   Radiology DG ABD ACUTE 2+V W 1V CHEST  Result Date: 11/08/2019 CLINICAL DATA:  Chest pain and shortness of breath.  Diarrhea. EXAM: DG ABDOMEN ACUTE W/ 1V CHEST COMPARISON:  Abdomen series December 17, 2009; chest radiograph August 25, 2019 FINDINGS: PA chest: Lungs are clear. Heart size and pulmonary vascularity are normal. No adenopathy. Supine and upright abdomen: There is moderate stool in the colon. There is no bowel dilatation or air-fluid level to suggest bowel obstruction. No free air. IMPRESSION: No bowel obstruction or free air. Moderate stool in colon. No edema or airspace opacity. Electronically Signed   By: Lowella Grip III M.D.   On: 11/08/2019 11:55    Procedures Procedures (including critical care time)  Medications Ordered in ED Medications  albuterol (VENTOLIN HFA) 108 (90 Base) MCG/ACT inhaler 4 puff (has no administration in time range)  sodium chloride flush (NS) 0.9 % injection 3 mL (3 mLs Intravenous Given 11/08/19 1123)  sodium chloride 0.9 % bolus  1,000 mL (0 mLs Intravenous Stopped 11/08/19 1321)  ondansetron (ZOFRAN) injection 4 mg (4 mg Intravenous Given 11/08/19 1124)  ketorolac (TORADOL) 30 MG/ML injection 30 mg (30 mg Intravenous Given 11/08/19 1124)  sodium chloride 0.9 % bolus 1,000 mL (1,000 mLs Intravenous New Bag/Given 11/08/19 1322)  potassium chloride SA (KLOR-CON) CR tablet 40 mEq (40 mEq Oral Given 11/08/19 1320)    ED Course  I have reviewed the triage vital signs and the nursing notes.  Pertinent labs & imaging results that were available during my care of the patient were reviewed by me and considered in my medical decision making (see chart for details).    Garik Diamant was evaluated in Emergency Department on 11/08/2019 for the symptoms described in the history of present illness. He was evaluated in the context of the global COVID-19 pandemic, which necessitated consideration that the patient might be at risk for infection with the SARS-CoV-2 virus that causes COVID-19. Institutional protocols and algorithms that pertain to the evaluation of patients at risk for COVID-19 are in a state of rapid change based on information released by regulatory bodies including the CDC and federal and state organizations. These policies and algorithms were followed during the patient's care in the ED.  MDM Rules/Calculators/A&P                      Patient with positive Covid.  Patient with AKI and dyspnea.  The hospitalist will evaluate the patient     This patient presents to the ED for concern of vomiting and diarrhea, this involves an extensive number of treatment options, and is a complaint that carries with it a high risk of complications and morbidity.  The differential diagnosis includes viral syndrome appendicitis  Lab Tests:   I Ordered, reviewed, and interpreted labs, which included CBC and chemistries.  Chemistries show an AKI and patient also had a positive Covid  Medicines ordered:    I ordered medication nausea medicine  and fluids for dehydration  Imaging Studies ordered:   I ordered imaging studies which included chest x-ray and abdominal films I independently visualized and interpreted imaging which showed no acute disease Additional history obtained:   Additional history obtained from records  Previous records obtained and reviewed   Consultations Obtained:   I consulted hospitalist and discussed lab and imaging findings  Reevaluation:  After the interventions stated above, I reevaluated the patient and found unchanged  Critical Interventions:  .    Final Clinical Impression(s) / ED Diagnoses Final diagnoses:  None    Rx / DC Orders ED Discharge Orders    None       Milton Ferguson, MD 11/08/19 1442

## 2019-11-08 NOTE — ED Notes (Signed)
CRITICAL VALUE ALERT  Critical Value:  Covid positive  Date & Time Notied:  11/08/19  1425  Provider Notified: Dr Roderic Palau  Orders Received/Actions taken:

## 2019-11-08 NOTE — ED Notes (Signed)
Gave patient diet ginger ale as requested.

## 2019-11-09 DIAGNOSIS — Z7982 Long term (current) use of aspirin: Secondary | ICD-10-CM | POA: Diagnosis not present

## 2019-11-09 DIAGNOSIS — I129 Hypertensive chronic kidney disease with stage 1 through stage 4 chronic kidney disease, or unspecified chronic kidney disease: Secondary | ICD-10-CM | POA: Diagnosis present

## 2019-11-09 DIAGNOSIS — R112 Nausea with vomiting, unspecified: Secondary | ICD-10-CM | POA: Diagnosis present

## 2019-11-09 DIAGNOSIS — R0789 Other chest pain: Secondary | ICD-10-CM | POA: Diagnosis present

## 2019-11-09 DIAGNOSIS — U071 COVID-19: Secondary | ICD-10-CM | POA: Diagnosis present

## 2019-11-09 DIAGNOSIS — E860 Dehydration: Secondary | ICD-10-CM | POA: Diagnosis present

## 2019-11-09 DIAGNOSIS — Z8249 Family history of ischemic heart disease and other diseases of the circulatory system: Secondary | ICD-10-CM | POA: Diagnosis not present

## 2019-11-09 DIAGNOSIS — J45909 Unspecified asthma, uncomplicated: Secondary | ICD-10-CM | POA: Diagnosis present

## 2019-11-09 DIAGNOSIS — Z8261 Family history of arthritis: Secondary | ICD-10-CM | POA: Diagnosis not present

## 2019-11-09 DIAGNOSIS — Z91013 Allergy to seafood: Secondary | ICD-10-CM | POA: Diagnosis not present

## 2019-11-09 DIAGNOSIS — M1990 Unspecified osteoarthritis, unspecified site: Secondary | ICD-10-CM | POA: Diagnosis present

## 2019-11-09 DIAGNOSIS — N1831 Chronic kidney disease, stage 3a: Secondary | ICD-10-CM | POA: Diagnosis present

## 2019-11-09 DIAGNOSIS — Z841 Family history of disorders of kidney and ureter: Secondary | ICD-10-CM | POA: Diagnosis not present

## 2019-11-09 DIAGNOSIS — Z833 Family history of diabetes mellitus: Secondary | ICD-10-CM | POA: Diagnosis not present

## 2019-11-09 DIAGNOSIS — N179 Acute kidney failure, unspecified: Secondary | ICD-10-CM | POA: Diagnosis present

## 2019-11-09 DIAGNOSIS — E876 Hypokalemia: Secondary | ICD-10-CM | POA: Diagnosis present

## 2019-11-09 DIAGNOSIS — A0839 Other viral enteritis: Secondary | ICD-10-CM | POA: Diagnosis present

## 2019-11-09 DIAGNOSIS — Z91041 Radiographic dye allergy status: Secondary | ICD-10-CM | POA: Diagnosis not present

## 2019-11-09 LAB — COMPREHENSIVE METABOLIC PANEL
ALT: 28 U/L (ref 0–44)
AST: 64 U/L — ABNORMAL HIGH (ref 15–41)
Albumin: 3.5 g/dL (ref 3.5–5.0)
Alkaline Phosphatase: 62 U/L (ref 38–126)
Anion gap: 9 (ref 5–15)
BUN: 17 mg/dL (ref 6–20)
CO2: 27 mmol/L (ref 22–32)
Calcium: 8 mg/dL — ABNORMAL LOW (ref 8.9–10.3)
Chloride: 99 mmol/L (ref 98–111)
Creatinine, Ser: 1.95 mg/dL — ABNORMAL HIGH (ref 0.61–1.24)
GFR calc Af Amer: 43 mL/min — ABNORMAL LOW (ref >60)
GFR calc non Af Amer: 37 mL/min — ABNORMAL LOW (ref >60)
Glucose, Bld: 96 mg/dL (ref 70–99)
Potassium: 3.4 mmol/L — ABNORMAL LOW (ref 3.5–5.1)
Sodium: 135 mmol/L (ref 135–145)
Total Bilirubin: 0.8 mg/dL (ref 0.3–1.2)
Total Protein: 7.1 g/dL (ref 6.5–8.1)

## 2019-11-09 LAB — CBC
HCT: 39.2 % (ref 39.0–52.0)
Hemoglobin: 12.6 g/dL — ABNORMAL LOW (ref 13.0–17.0)
MCH: 30.9 pg (ref 26.0–34.0)
MCHC: 32.1 g/dL (ref 30.0–36.0)
MCV: 96.1 fL (ref 80.0–100.0)
Platelets: 273 10*3/uL (ref 150–400)
RBC: 4.08 MIL/uL — ABNORMAL LOW (ref 4.22–5.81)
RDW: 12.6 % (ref 11.5–15.5)
WBC: 6.3 10*3/uL (ref 4.0–10.5)
nRBC: 0 % (ref 0.0–0.2)

## 2019-11-09 LAB — MAGNESIUM: Magnesium: 1.8 mg/dL (ref 1.7–2.4)

## 2019-11-09 MED ORDER — HYDROCODONE-ACETAMINOPHEN 5-325 MG PO TABS
1.0000 | ORAL_TABLET | ORAL | Status: DC | PRN
  Administered 2019-11-09 – 2019-11-10 (×3): 1 via ORAL
  Filled 2019-11-09 (×3): qty 1

## 2019-11-09 MED ORDER — GUAIFENESIN-DM 100-10 MG/5ML PO SYRP
5.0000 mL | ORAL_SOLUTION | ORAL | Status: DC | PRN
  Administered 2019-11-09: 5 mL via ORAL
  Filled 2019-11-09: qty 5

## 2019-11-09 MED ORDER — POTASSIUM CHLORIDE CRYS ER 20 MEQ PO TBCR
40.0000 meq | EXTENDED_RELEASE_TABLET | Freq: Once | ORAL | Status: AC
  Administered 2019-11-09: 40 meq via ORAL
  Filled 2019-11-09: qty 2

## 2019-11-09 MED ORDER — ONDANSETRON HCL 4 MG/2ML IJ SOLN
4.0000 mg | Freq: Four times a day (QID) | INTRAMUSCULAR | Status: DC
  Administered 2019-11-09 – 2019-11-10 (×4): 4 mg via INTRAVENOUS
  Filled 2019-11-09 (×4): qty 2

## 2019-11-09 MED ORDER — MORPHINE SULFATE (PF) 2 MG/ML IV SOLN
1.0000 mg | Freq: Once | INTRAVENOUS | Status: AC
  Administered 2019-11-09: 1 mg via INTRAVENOUS
  Filled 2019-11-09: qty 1

## 2019-11-09 NOTE — Progress Notes (Signed)
Pt reported chest pain 8/10. Pt reported back and headache also. Pt stated he has been coughing however stated the chest pain was new and none related to previous coughing spells. MD notified, vitals and EKG done

## 2019-11-09 NOTE — Progress Notes (Signed)
Pt reported lower back pain 10/10. Pt stated it is a sharp pain that just started. MD notified. PRN medication ordered. (see mar)

## 2019-11-09 NOTE — Progress Notes (Signed)
PROGRESS NOTE    Ethan Schmidt  NIO:[id number] DOB: [birth year] DOA: 11/08/2019 PCP: Patient, No Pcp Per   Brief Narrative:  Per HPI: Ethan Schmidt is a 60 y.o. male with medical history significant for hypertension, asthma, and CKD stage IIIa who presented to the ED with complaints of ongoing nausea, vomiting, and diarrhea over the last 3 to 4 days.  He does also have some mild wheezing and dyspnea over a similar period of time.  He denies any fevers, chills, abdominal pain, or any chest pain.  He denies any sick contacts and states that he has only been able to keep down some soup occasionally over the last few days.  -Patient was admitted with AKI on CKD stage IIIa secondary to intractable nausea and vomiting along with diarrhea related to Covid.  AKI has improved on 5/8 with fluid hydration, but he continues to have nausea, vomiting, and diarrhea.  Tried advancing diet with no improvement.  Will scale back to clear liquid diet and add Zofran every 6 hours.  Assessment & Plan:   Active Problems:   AKI (acute kidney injury) ([place])   AKI on CKD stage IIIa-improving -Recheck a.m. labs -Continue IV fluid -Creatinine currently 1.95 -Strict I's and O's and avoid nephrotoxic agents to include home HCTZ and lisinopril  Intractable nausea and vomiting along with diarrhea likely secondary to Covid -Zofran IV scheduled every 6 hours -Imodium as needed -Clear liquid diet  Mild hypokalemia -Replete again and reevaluate in a.m.  History of hypertension-stable -Continue on amlodipine and metoprolol -Hold lisinopril and HCTZ given AKI  History of asthma -Albuterol inhaler treatments as needed -No significant hypoxemia noted at this time -Chest x-ray clear and no signs of Covid pneumonia noted   DVT prophylaxis: Heparin Code Status: Full Family Communication: None at bedside Disposition Plan: Continue IV fluid hydration and clear liquid diet and placed on Zofran scheduled. Status  is: Inpatient  Remains inpatient appropriate because:IV treatments appropriate due to intensity of illness or inability to take PO   Dispo: The patient is from: Home              Anticipated d/c is to: Home              Anticipated d/c date is: 1 day              Patient currently is not medically stable to d/c.  Consultants:   None  Procedures:   None  Antimicrobials:   None   Subjective: Patient seen and evaluated today with ongoing complaints of nausea and vomiting as well as diarrhea.  He threw up his lunch after diet was advanced.  Objective: Vitals:   11/09/19 0451 11/09/19 0917 11/09/19 1120 11/09/19 1337  BP: (!) 142/87 135/82  117/70  Pulse: 76 66  63  Resp: 20 16  16   Temp:  (!) 100.7 F (38.2 C) 99.3 F (37.4 C) 98.8 F (37.1 C)  TempSrc:   Oral   SpO2: 100% 98%  92%  Weight:      Height:        Intake/Output Summary (Last 24 hours) at 11/09/2019 1344 Last data filed at 11/09/2019 1300 Gross per 24 hour  Intake 1649.01 ml  Output --  Net 1649.01 ml   Filed Weights   11/08/19 1021  Weight: 104.3 kg    Examination:  General exam: Appears calm and comfortable  Respiratory system: Clear to auscultation. Respiratory effort normal. Cardiovascular system: S1 & S2 heard, RRR. No JVD,  murmurs, rubs, gallops or clicks. No pedal edema. Gastrointestinal system: Abdomen is nondistended, soft and nontender. No organomegaly or masses felt. Normal bowel sounds heard. Central nervous system: Alert and oriented. No focal neurological deficits. Extremities: Symmetric 5 x 5 power. Skin: No rashes, lesions or ulcers Psychiatry: Judgement and insight appear normal. Mood & affect appropriate.     Data Reviewed: I have personally reviewed following labs and imaging studies  CBC: Recent Labs  Lab 11/08/19 1048 11/09/19 0733  WBC 8.0 6.3  HGB 13.3 12.6*  HCT 40.4 39.2  MCV 95.5 96.1  PLT 294 201   Basic Metabolic Panel: Recent Labs  Lab 11/08/19 1048  11/09/19 0733  NA 137 135  K 3.2* 3.4*  CL 94* 99  CO2 27 27  GLUCOSE 138* 96  BUN 19 17  CREATININE 2.44* 1.95*  CALCIUM 8.8* 8.0*  MG  --  1.8   GFR: Estimated Creatinine Clearance: 54.4 mL/min (A) (by C-G formula based on SCr of 1.95 mg/dL (H)). Liver Function Tests: Recent Labs  Lab 11/08/19 1048 11/09/19 0733  AST 54* 64*  ALT 26 28  ALKPHOS 72 62  BILITOT 0.8 0.8  PROT 8.2* 7.1  ALBUMIN 3.9 3.5   No results for input(s): LIPASE, AMYLASE in the last 168 hours. No results for input(s): AMMONIA in the last 168 hours. Coagulation Profile: No results for input(s): INR, PROTIME in the last 168 hours. Cardiac Enzymes: No results for input(s): CKTOTAL, CKMB, CKMBINDEX, TROPONINI in the last 168 hours. BNP (last 3 results) No results for input(s): PROBNP in the last 8760 hours. HbA1C: No results for input(s): HGBA1C in the last 72 hours. CBG: No results for input(s): GLUCAP in the last 168 hours. Lipid Profile: No results for input(s): CHOL, HDL, LDLCALC, TRIG, CHOLHDL, LDLDIRECT in the last 72 hours. Thyroid Function Tests: No results for input(s): TSH, T4TOTAL, FREET4, T3FREE, THYROIDAB in the last 72 hours. Anemia Panel: No results for input(s): VITAMINB12, FOLATE, FERRITIN, TIBC, IRON, RETICCTPCT in the last 72 hours. Sepsis Labs: No results for input(s): PROCALCITON, LATICACIDVEN in the last 168 hours.  Recent Results (from the past 240 hour(s))  Respiratory Panel by RT PCR (Flu A&B, Covid) - Nasopharyngeal Swab     Status: Abnormal   Collection Time: 11/08/19 12:02 PM   Specimen: Nasopharyngeal Swab  Result Value Ref Range Status   SARS Coronavirus 2 by RT PCR POSITIVE (A) NEGATIVE Final    Comment: CRITICAL RESULT CALLED TO, READ BACK BY AND VERIFIED WITH: SHORE L. AT 0071 ON [id number] BY THOMPSON S. (NOTE) SARS-CoV-2 target nucleic acids are DETECTED. SARS-CoV-2 RNA is generally detectable in upper respiratory specimens  during the acute phase of infection.  Positive results are indicative of the presence of the identified virus, but do not rule out bacterial infection or co-infection with other pathogens not detected by the test. Clinical correlation with patient history and other diagnostic information is necessary to determine patient infection status. The expected result is Negative. Fact Sheet for Patients:  PinkCheek.be Fact Sheet for Healthcare Providers: GravelBags.it This test is not yet approved or cleared by the [place] FDA and  has been authorized for detection and/or diagnosis of SARS-CoV-2 by FDA under an Emergency Use Authorization (EUA).  This EUA will remain in effect (meaning this test  can be used) for the duration of  the COVID-19 declaration under Section 564(b)(1) of the Act, 21 U.S.C. section 360bbb-3(b)(1), unless the authorization is terminated or revoked sooner.    Influenza A  by PCR NEGATIVE NEGATIVE Final   Influenza B by PCR NEGATIVE NEGATIVE Final    Comment: (NOTE) The Xpert Xpress SARS-CoV-2/FLU/RSV assay is intended as an aid in  the diagnosis of influenza from Nasopharyngeal swab specimens and  should not be used as a sole basis for treatment. Nasal washings and  aspirates are unacceptable for Xpert Xpress SARS-CoV-2/FLU/RSV  testing. Fact Sheet for Patients: PinkCheek.be Fact Sheet for Healthcare Providers: GravelBags.it This test is not yet approved or cleared by the [place] FDA and  has been authorized for detection and/or diagnosis of SARS-CoV-2 by  FDA under an Emergency Use Authorization (EUA). This EUA will remain  in effect (meaning this test can be used) for the duration of the  Covid-19 declaration under Section 564(b)(1) of the Act, 21  U.S.C. section 360bbb-3(b)(1), unless the authorization is  terminated or revoked. Performed at [facility], [address]  [place], [place] [address]          Radiology Studies: DG ABD ACUTE 2+V W 1V CHEST  Result Date: 11/08/2019 CLINICAL DATA:  Chest pain and shortness of breath.  Diarrhea. EXAM: DG ABDOMEN ACUTE W/ 1V CHEST COMPARISON:  Abdomen series December 17, 2009; chest radiograph August 25, 2019 FINDINGS: PA chest: Lungs are clear. Heart size and pulmonary vascularity are normal. No adenopathy. Supine and upright abdomen: There is moderate stool in the colon. There is no bowel dilatation or air-fluid level to suggest bowel obstruction. No free air. IMPRESSION: No bowel obstruction or free air. Moderate stool in colon. No edema or airspace opacity. Electronically Signed   By: Lowella Grip III M.D.   On: 11/08/2019 11:55        Scheduled Meds: . amLODipine  10 mg Oral Daily  . aspirin  81 mg Oral Daily  . heparin  5,000 Units Subcutaneous Q8H  . metoprolol tartrate  100 mg Oral BID  . ondansetron (ZOFRAN) IV  4 mg Intravenous Q6H  . potassium chloride  10 mEq Oral Daily  . potassium chloride  40 mEq Oral Once   Continuous Infusions: . sodium chloride 100 mL/hr at 11/08/19 2010     LOS: 0 days    Time spent: 30 minutes    Ethan Buscemi Darleen Crocker, DO [facility]  If 7PM-7AM, please contact night-coverage [email or web address] 11/09/2019, 1:44 PM

## 2019-11-10 LAB — BASIC METABOLIC PANEL
Anion gap: 9 (ref 5–15)
BUN: 16 mg/dL (ref 6–20)
CO2: 25 mmol/L (ref 22–32)
Calcium: 7.9 mg/dL — ABNORMAL LOW (ref 8.9–10.3)
Chloride: 102 mmol/L (ref 98–111)
Creatinine, Ser: 1.94 mg/dL — ABNORMAL HIGH (ref 0.61–1.24)
GFR calc Af Amer: 44 mL/min — ABNORMAL LOW (ref >60)
GFR calc non Af Amer: 38 mL/min — ABNORMAL LOW (ref >60)
Glucose, Bld: 103 mg/dL — ABNORMAL HIGH (ref 70–99)
Potassium: 3.4 mmol/L — ABNORMAL LOW (ref 3.5–5.1)
Sodium: 136 mmol/L (ref 135–145)

## 2019-11-10 LAB — CBC
HCT: 37.6 % — ABNORMAL LOW (ref 39.0–52.0)
Hemoglobin: 11.9 g/dL — ABNORMAL LOW (ref 13.0–17.0)
MCH: 30.7 pg (ref 26.0–34.0)
MCHC: 31.6 g/dL (ref 30.0–36.0)
MCV: 96.9 fL (ref 80.0–100.0)
Platelets: 267 10*3/uL (ref 150–400)
RBC: 3.88 MIL/uL — ABNORMAL LOW (ref 4.22–5.81)
RDW: 12.6 % (ref 11.5–15.5)
WBC: 6.9 10*3/uL (ref 4.0–10.5)
nRBC: 0 % (ref 0.0–0.2)

## 2019-11-10 LAB — MAGNESIUM: Magnesium: 1.8 mg/dL (ref 1.7–2.4)

## 2019-11-10 MED ORDER — ONDANSETRON HCL 4 MG PO TABS: 4.0000 mg | ORAL_TABLET | Freq: Every day | ORAL | 1 refills | Status: DC | PRN

## 2019-11-10 MED ORDER — LOPERAMIDE HCL 2 MG PO CAPS: 2.0000 mg | ORAL_CAPSULE | ORAL | 0 refills | Status: AC | PRN

## 2019-11-10 NOTE — Discharge Summary (Signed)
Physician Discharge Summary  Ethan Schmidt ZDG:[id number] DOB: 11-18-1960 DOA: 11/08/2019  PCP: Patient, No Pcp Per  Admit date: 11/08/2019  Discharge date: 11/10/2019  Admitted From:Home  Disposition:  Home  Recommendations for Outpatient Follow-up:  1. Follow up with PCP in 1-2 weeks 2. Continue on Zofran and Imodium as prescribed  Home Health: None  Equipment/Devices: None  Discharge Condition: Stable  CODE STATUS: Full  Diet recommendation: Heart Healthy  Brief/Interim Summary: Per HPI: Ethan Schmidt a 59 y.o.malewith medical history significant forhypertension,asthma,and CKD stage IIIawho presented to the ED with complaints of ongoing nausea, vomiting, and diarrhea over the last 3 to 4 days. He does also have some mild wheezing and dyspnea over a similar period of time. He denies any fevers, chills, abdominal pain, or any chest pain. He denies any sick contacts and states that he has only been able to keep down some soup occasionally over the last few days.  -Patient was admitted with AKI on CKD stage IIIa secondary to intractable nausea and vomiting along with diarrhea related to Covid.  AKI has improved on 5/8 with fluid hydration, but he continued to have nausea, vomiting, and diarrhea.  Therefore, he was placed on scheduled Zofran and clear liquids overnight.  He overall feels much better this morning and is eager to have a full diet and is eager to go home.  I have prescribed him Zofran and Imodium as needed for symptomatic control.  He will have ongoing symptoms for several more days until he clears his viral infection.  His kidney function is much improved in general and he no longer has AKI and is back to his baseline creatinine of approximately 1.9 in the setting of stage III CKD.  Discharge Diagnoses:  Active Problems:   AKI (acute kidney injury) ([place])  Principal discharge diagnosis: Prerenal AKI on CKD stage IIIa in setting of GI losses related to Covid  infection.  Discharge Instructions  Discharge Instructions    Diet - low sodium heart healthy   Complete by: As directed    Increase activity slowly   Complete by: As directed      Allergies as of 11/10/2019      Reactions   Shellfish Allergy Anaphylaxis   Omnipaque [iohexol] Hives, Swelling   Pt. Did fine with 1 hour pre medication.      Medication List    STOP taking these medications   potassium chloride SA 20 MEQ tablet Commonly known as: KLOR-CON     TAKE these medications   amLODipine 10 MG tablet Commonly known as: NORVASC Take 1 tablet (10 mg total) by mouth daily.   aspirin 81 MG tablet Take 81 mg by mouth daily.   hydrochlorothiazide 25 MG tablet Commonly known as: HYDRODIURIL Take 1 tablet (25 mg total) by mouth daily.   lisinopril 20 MG tablet Commonly known as: ZESTRIL Take 1 tablet (20 mg total) by mouth daily.   loperamide 2 MG capsule Commonly known as: IMODIUM Take 1 capsule (2 mg total) by mouth as needed for diarrhea or loose stools.   metoprolol tartrate 100 MG tablet Commonly known as: LOPRESSOR Take 1 tablet (100 mg total) by mouth 2 (two) times daily.   ondansetron 4 MG tablet Commonly known as: Zofran Take 1 tablet (4 mg total) by mouth daily as needed for nausea or vomiting.   potassium chloride 10 MEQ tablet Commonly known as: KLOR-CON Take 1 tablet (10 mEq total) by mouth daily.      Follow-up Information  pcp Follow up in 1 week(s).          Allergies  Allergen Reactions  . Shellfish Allergy Anaphylaxis  . Omnipaque [Iohexol] Hives and Swelling    Pt. Did fine with 1 hour pre medication.    Consultations:  None   Procedures/Studies: DG ABD ACUTE 2+V W 1V CHEST  Result Date: 11/08/2019 CLINICAL DATA:  Chest pain and shortness of breath.  Diarrhea. EXAM: DG ABDOMEN ACUTE W/ 1V CHEST COMPARISON:  Abdomen series December 17, 2009; chest radiograph August 25, 2019 FINDINGS: PA chest: Lungs are clear. Heart size and  pulmonary vascularity are normal. No adenopathy. Supine and upright abdomen: There is moderate stool in the colon. There is no bowel dilatation or air-fluid level to suggest bowel obstruction. No free air. IMPRESSION: No bowel obstruction or free air. Moderate stool in colon. No edema or airspace opacity. Electronically Signed   By: Lowella Grip III M.D.   On: 11/08/2019 11:55     Discharge Exam: Vitals:   11/09/19 2158 11/10/19 0528  BP: (!) 143/81 137/89  Pulse: 92 71  Resp: 20 20  Temp: 98.3 F (36.8 C) 99.3 F (37.4 C)  SpO2: 93% 96%   Vitals:   11/09/19 1120 11/09/19 1337 11/09/19 2158 11/10/19 0528  BP:  117/70 (!) 143/81 137/89  Pulse:  63 92 71  Resp:  16 20 20   Temp: 99.3 F (37.4 C) 98.8 F (37.1 C) 98.3 F (36.8 C) 99.3 F (37.4 C)  TempSrc: Oral Oral Oral Oral  SpO2:  92% 93% 96%  Weight:      Height:        General: Pt is alert, awake, not in acute distress Cardiovascular: RRR, S1/S2 +, no rubs, no gallops Respiratory: CTA bilaterally, no wheezing, no rhonchi Abdominal: Soft, NT, ND, bowel sounds + Extremities: no edema, no cyanosis    The results of significant diagnostics from this hospitalization (including imaging, microbiology, ancillary and laboratory) are listed below for reference.     Microbiology: Recent Results (from the past 240 hour(s))  Respiratory Panel by RT PCR (Flu A&B, Covid) - Nasopharyngeal Swab     Status: Abnormal   Collection Time: 11/08/19 12:02 PM   Specimen: Nasopharyngeal Swab  Result Value Ref Range Status   SARS Coronavirus 2 by RT PCR POSITIVE (A) NEGATIVE Final    Comment: CRITICAL RESULT CALLED TO, READ BACK BY AND VERIFIED WITH: SHORE L. AT [id number] ON [id number] BY THOMPSON S. (NOTE) SARS-CoV-2 target nucleic acids are DETECTED. SARS-CoV-2 RNA is generally detectable in upper respiratory specimens  during the acute phase of infection. Positive results are indicative of the presence of the identified virus, but do not  rule out bacterial infection or co-infection with other pathogens not detected by the test. Clinical correlation with patient history and other diagnostic information is necessary to determine patient infection status. The expected result is Negative. Fact Sheet for Patients:  PinkCheek.be Fact Sheet for Healthcare Providers: GravelBags.it This test is not yet approved or cleared by the [place] FDA and  has been authorized for detection and/or diagnosis of SARS-CoV-2 by FDA under an Emergency Use Authorization (EUA).  This EUA will remain in effect (meaning this test  can be used) for the duration of  the COVID-19 declaration under Section 564(b)(1) of the Act, 21 U.S.C. section 360bbb-3(b)(1), unless the authorization is terminated or revoked sooner.    Influenza A by PCR NEGATIVE NEGATIVE Final   Influenza B by PCR NEGATIVE NEGATIVE Final  Comment: (NOTE) The Xpert Xpress SARS-CoV-2/FLU/RSV assay is intended as an aid in  the diagnosis of influenza from Nasopharyngeal swab specimens and  should not be used as a sole basis for treatment. Nasal washings and  aspirates are unacceptable for Xpert Xpress SARS-CoV-2/FLU/RSV  testing. Fact Sheet for Patients: PinkCheek.be Fact Sheet for Healthcare Providers: GravelBags.it This test is not yet approved or cleared by the [place] FDA and  has been authorized for detection and/or diagnosis of SARS-CoV-2 by  FDA under an Emergency Use Authorization (EUA). This EUA will remain  in effect (meaning this test can be used) for the duration of the  Covid-19 declaration under Section 564(b)(1) of the Act, 21  U.S.C. section 360bbb-3(b)(1), unless the authorization is  terminated or revoked. Performed at [facility], [address], [place], [place] [address]      Labs: BNP (last 3 results) No results for input(s):  BNP in the last 8760 hours. Basic Metabolic Panel: Recent Labs  Lab 11/08/19 1048 11/09/19 0733 11/10/19 0710  NA 137 135 136  K 3.2* 3.4* 3.4*  CL 94* 99 102  CO2 27 27 25   GLUCOSE 138* 96 103*  BUN 19 17 16   CREATININE 2.44* 1.95* 1.94*  CALCIUM 8.8* 8.0* 7.9*  MG  --  1.8 1.8   Liver Function Tests: Recent Labs  Lab 11/08/19 1048 11/09/19 0733  AST 54* 64*  ALT 26 28  ALKPHOS 72 62  BILITOT 0.8 0.8  PROT 8.2* 7.1  ALBUMIN 3.9 3.5   No results for input(s): LIPASE, AMYLASE in the last 168 hours. No results for input(s): AMMONIA in the last 168 hours. CBC: Recent Labs  Lab 11/08/19 1048 11/09/19 0733 11/10/19 0710  WBC 8.0 6.3 6.9  HGB 13.3 12.6* 11.9*  HCT 40.4 39.2 37.6*  MCV 95.5 96.1 96.9  PLT 294 273 267   Cardiac Enzymes: No results for input(s): CKTOTAL, CKMB, CKMBINDEX, TROPONINI in the last 168 hours. BNP: Invalid input(s): POCBNP CBG: No results for input(s): GLUCAP in the last 168 hours. D-Dimer No results for input(s): DDIMER in the last 72 hours. Hgb A1c No results for input(s): HGBA1C in the last 72 hours. Lipid Profile No results for input(s): CHOL, HDL, LDLCALC, TRIG, CHOLHDL, LDLDIRECT in the last 72 hours. Thyroid function studies No results for input(s): TSH, T4TOTAL, T3FREE, THYROIDAB in the last 72 hours.  Invalid input(s): FREET3 Anemia work up No results for input(s): VITAMINB12, FOLATE, FERRITIN, TIBC, IRON, RETICCTPCT in the last 72 hours. Urinalysis    Component Value Date/Time   COLORURINE YELLOW 04/10/2017 1907   APPEARANCEUR HAZY (A) 04/10/2017 1907   LABSPEC 1.030 04/10/2017 1907   PHURINE 5.0 04/10/2017 1907   GLUCOSEU NEGATIVE 04/10/2017 1907   HGBUR NEGATIVE 04/10/2017 1907   BILIRUBINUR SMALL (A) 04/10/2017 1907   KETONESUR NEGATIVE 04/10/2017 1907   PROTEINUR 100 (A) 04/10/2017 1907   UROBILINOGEN 0.2 11/23/2011 0130   NITRITE NEGATIVE 04/10/2017 1907   LEUKOCYTESUR NEGATIVE 04/10/2017 1907   Sepsis  Labs Invalid input(s): PROCALCITONIN,  WBC,  LACTICIDVEN Microbiology Recent Results (from the past 240 hour(s))  Respiratory Panel by RT PCR (Flu A&B, Covid) - Nasopharyngeal Swab     Status: Abnormal   Collection Time: 11/08/19 12:02 PM   Specimen: Nasopharyngeal Swab  Result Value Ref Range Status   SARS Coronavirus 2 by RT PCR POSITIVE (A) NEGATIVE Final    Comment: CRITICAL RESULT CALLED TO, READ BACK BY AND VERIFIED WITH: SHORE L. AT [place] ON [id number] BY  THOMPSON S. (NOTE) SARS-CoV-2 target nucleic acids are DETECTED. SARS-CoV-2 RNA is generally detectable in upper respiratory specimens  during the acute phase of infection. Positive results are indicative of the presence of the identified virus, but do not rule out bacterial infection or co-infection with other pathogens not detected by the test. Clinical correlation with patient history and other diagnostic information is necessary to determine patient infection status. The expected result is Negative. Fact Sheet for Patients:  PinkCheek.be Fact Sheet for Healthcare Providers: GravelBags.it This test is not yet approved or cleared by the [place] FDA and  has been authorized for detection and/or diagnosis of SARS-CoV-2 by FDA under an Emergency Use Authorization (EUA).  This EUA will remain in effect (meaning this test  can be used) for the duration of  the COVID-19 declaration under Section 564(b)(1) of the Act, 21 U.S.C. section 360bbb-3(b)(1), unless the authorization is terminated or revoked sooner.    Influenza A by PCR NEGATIVE NEGATIVE Final   Influenza B by PCR NEGATIVE NEGATIVE Final    Comment: (NOTE) The Xpert Xpress SARS-CoV-2/FLU/RSV assay is intended as an aid in  the diagnosis of influenza from Nasopharyngeal swab specimens and  should not be used as a sole basis for treatment. Nasal washings and  aspirates are unacceptable for Xpert Xpress  SARS-CoV-2/FLU/RSV  testing. Fact Sheet for Patients: PinkCheek.be Fact Sheet for Healthcare Providers: GravelBags.it This test is not yet approved or cleared by the [place] FDA and  has been authorized for detection and/or diagnosis of SARS-CoV-2 by  FDA under an Emergency Use Authorization (EUA). This EUA will remain  in effect (meaning this test can be used) for the duration of the  Covid-19 declaration under Section 564(b)(1) of the Act, 21  U.S.C. section 360bbb-3(b)(1), unless the authorization is  terminated or revoked. Performed at [facility], [address], [place], [place] [address]      Time coordinating discharge: 35 minutes  SIGNED:   Rodena Goldmann, DO [facility] 11/10/2019, 9:37 AM  If 7PM-7AM, please contact night-coverage [email or web address]

## 2019-11-10 NOTE — Progress Notes (Signed)
Discharge instructions provided to patient. Instructions reviewed with patient. Medications reviewed, where to pick up from pharmacy and when to take next doses. Patient had no questions at this time. IV removed and patient called ride.

## 2019-11-11 ENCOUNTER — Encounter (HOSPITAL_COMMUNITY): Payer: Self-pay

## 2019-11-11 ENCOUNTER — Emergency Department (HOSPITAL_COMMUNITY): Payer: Medicaid - Out of State

## 2019-11-11 ENCOUNTER — Inpatient Hospital Stay (HOSPITAL_COMMUNITY)
Admission: EM | Admit: 2019-11-11 | Discharge: 2019-11-15 | DRG: 177 | Disposition: A | Discharge status: Home / Self Care | Payer: Medicaid - Out of State | Attending: Family Medicine | Admitting: Family Medicine

## 2019-11-11 ENCOUNTER — Other Ambulatory Visit: Payer: Self-pay

## 2019-11-11 DIAGNOSIS — Z833 Family history of diabetes mellitus: Secondary | ICD-10-CM | POA: Diagnosis not present

## 2019-11-11 DIAGNOSIS — Z841 Family history of disorders of kidney and ureter: Secondary | ICD-10-CM

## 2019-11-11 DIAGNOSIS — R0902 Hypoxemia: Secondary | ICD-10-CM

## 2019-11-11 DIAGNOSIS — Z91041 Radiographic dye allergy status: Secondary | ICD-10-CM | POA: Diagnosis not present

## 2019-11-11 DIAGNOSIS — J45909 Unspecified asthma, uncomplicated: Secondary | ICD-10-CM | POA: Diagnosis present

## 2019-11-11 DIAGNOSIS — Z6829 Body mass index (BMI) 29.0-29.9, adult: Secondary | ICD-10-CM | POA: Diagnosis not present

## 2019-11-11 DIAGNOSIS — U071 COVID-19: Secondary | ICD-10-CM | POA: Diagnosis present

## 2019-11-11 DIAGNOSIS — N179 Acute kidney failure, unspecified: Secondary | ICD-10-CM | POA: Diagnosis present

## 2019-11-11 DIAGNOSIS — Z91013 Allergy to seafood: Secondary | ICD-10-CM | POA: Diagnosis not present

## 2019-11-11 DIAGNOSIS — J9601 Acute respiratory failure with hypoxia: Secondary | ICD-10-CM | POA: Diagnosis present

## 2019-11-11 DIAGNOSIS — N1831 Chronic kidney disease, stage 3a: Secondary | ICD-10-CM | POA: Diagnosis present

## 2019-11-11 DIAGNOSIS — J1282 Pneumonia due to coronavirus disease 2019: Secondary | ICD-10-CM | POA: Diagnosis present

## 2019-11-11 DIAGNOSIS — Z8249 Family history of ischemic heart disease and other diseases of the circulatory system: Secondary | ICD-10-CM | POA: Diagnosis not present

## 2019-11-11 DIAGNOSIS — E669 Obesity, unspecified: Secondary | ICD-10-CM | POA: Diagnosis present

## 2019-11-11 DIAGNOSIS — Z8261 Family history of arthritis: Secondary | ICD-10-CM

## 2019-11-11 DIAGNOSIS — Z800 Family history of malignant neoplasm of digestive organs: Secondary | ICD-10-CM | POA: Diagnosis not present

## 2019-11-11 DIAGNOSIS — E559 Vitamin D deficiency, unspecified: Secondary | ICD-10-CM | POA: Diagnosis present

## 2019-11-11 DIAGNOSIS — A0839 Other viral enteritis: Secondary | ICD-10-CM | POA: Diagnosis present

## 2019-11-11 DIAGNOSIS — I129 Hypertensive chronic kidney disease with stage 1 through stage 4 chronic kidney disease, or unspecified chronic kidney disease: Secondary | ICD-10-CM | POA: Diagnosis present

## 2019-11-11 DIAGNOSIS — Z7982 Long term (current) use of aspirin: Secondary | ICD-10-CM

## 2019-11-11 DIAGNOSIS — Z79899 Other long term (current) drug therapy: Secondary | ICD-10-CM | POA: Diagnosis not present

## 2019-11-11 DIAGNOSIS — R197 Diarrhea, unspecified: Secondary | ICD-10-CM

## 2019-11-11 LAB — CBC WITH DIFFERENTIAL/PLATELET
Abs Immature Granulocytes: 0.02 10*3/uL (ref 0.00–0.07)
Basophils Absolute: 0 10*3/uL (ref 0.0–0.1)
Basophils Relative: 0 %
Eosinophils Absolute: 0 10*3/uL (ref 0.0–0.5)
Eosinophils Relative: 0 %
HCT: 40.2 % (ref 39.0–52.0)
Hemoglobin: 13 g/dL (ref 13.0–17.0)
Immature Granulocytes: 0 %
Lymphocytes Relative: 17 %
Lymphs Abs: 1.3 10*3/uL (ref 0.7–4.0)
MCH: 30.9 pg (ref 26.0–34.0)
MCHC: 32.3 g/dL (ref 30.0–36.0)
MCV: 95.5 fL (ref 80.0–100.0)
Monocytes Absolute: 0.4 10*3/uL (ref 0.1–1.0)
Monocytes Relative: 6 %
Neutro Abs: 5.7 10*3/uL (ref 1.7–7.7)
Neutrophils Relative %: 77 %
Platelets: 297 10*3/uL (ref 150–400)
RBC: 4.21 MIL/uL — ABNORMAL LOW (ref 4.22–5.81)
RDW: 12.7 % (ref 11.5–15.5)
WBC: 7.4 10*3/uL (ref 4.0–10.5)
nRBC: 0 % (ref 0.0–0.2)

## 2019-11-11 LAB — COMPREHENSIVE METABOLIC PANEL
ALT: 39 U/L (ref 0–44)
AST: 86 U/L — ABNORMAL HIGH (ref 15–41)
Albumin: 4 g/dL (ref 3.5–5.0)
Alkaline Phosphatase: 68 U/L (ref 38–126)
Anion gap: 14 (ref 5–15)
BUN: 22 mg/dL — ABNORMAL HIGH (ref 6–20)
CO2: 22 mmol/L (ref 22–32)
Calcium: 8.8 mg/dL — ABNORMAL LOW (ref 8.9–10.3)
Chloride: 99 mmol/L (ref 98–111)
Creatinine, Ser: 2.19 mg/dL — ABNORMAL HIGH (ref 0.61–1.24)
GFR calc Af Amer: 38 mL/min — ABNORMAL LOW (ref >60)
GFR calc non Af Amer: 32 mL/min — ABNORMAL LOW (ref >60)
Glucose, Bld: 100 mg/dL — ABNORMAL HIGH (ref 70–99)
Potassium: 3.6 mmol/L (ref 3.5–5.1)
Sodium: 135 mmol/L (ref 135–145)
Total Bilirubin: 1.1 mg/dL (ref 0.3–1.2)
Total Protein: 8.3 g/dL — ABNORMAL HIGH (ref 6.5–8.1)

## 2019-11-11 LAB — URINALYSIS, ROUTINE W REFLEX MICROSCOPIC
Bilirubin Urine: NEGATIVE
Glucose, UA: NEGATIVE mg/dL
Ketones, ur: NEGATIVE mg/dL
Leukocytes,Ua: NEGATIVE
Nitrite: NEGATIVE
Protein, ur: 100 mg/dL — AB
Specific Gravity, Urine: 1.02 (ref 1.005–1.030)
pH: 7 (ref 5.0–8.0)

## 2019-11-11 LAB — PROCALCITONIN: Procalcitonin: 0.15 ng/mL

## 2019-11-11 LAB — RAPID URINE DRUG SCREEN, HOSP PERFORMED
Amphetamines: NOT DETECTED
Barbiturates: NOT DETECTED
Benzodiazepines: NOT DETECTED
Cocaine: NOT DETECTED
Opiates: POSITIVE — AB
Tetrahydrocannabinol: POSITIVE — AB

## 2019-11-11 LAB — LACTIC ACID, PLASMA
Lactic Acid, Venous: 1.1 mmol/L (ref 0.5–1.9)
Lactic Acid, Venous: 0.8 mmol/L (ref 0.5–1.9)

## 2019-11-11 LAB — URINALYSIS, MICROSCOPIC (REFLEX)
Bacteria, UA: NONE SEEN
Squamous Epithelial / HPF: NONE SEEN (ref 0–5)
WBC, UA: NONE SEEN WBC/hpf (ref 0–5)

## 2019-11-11 LAB — ABO/RH: ABO/RH(D): O POS

## 2019-11-11 LAB — D-DIMER, QUANTITATIVE: D-Dimer, Quant: 1.4 ug/mL-FEU — ABNORMAL HIGH (ref 0.00–0.50)

## 2019-11-11 LAB — C-REACTIVE PROTEIN: CRP: 5.5 mg/dL — ABNORMAL HIGH (ref <1.0)

## 2019-11-11 LAB — FIBRINOGEN: Fibrinogen: 602 mg/dL — ABNORMAL HIGH (ref 210–475)

## 2019-11-11 LAB — LACTATE DEHYDROGENASE: LDH: 490 U/L — ABNORMAL HIGH (ref 98–192)

## 2019-11-11 LAB — FERRITIN: Ferritin: 4112 ng/mL — ABNORMAL HIGH (ref 24–336)

## 2019-11-11 LAB — TRIGLYCERIDES: Triglycerides: 177 mg/dL — ABNORMAL HIGH (ref <150)

## 2019-11-11 MED ORDER — ACETAMINOPHEN 325 MG PO TABS
650.0000 mg | ORAL_TABLET | Freq: Once | ORAL | Status: AC
  Administered 2019-11-11: 04:00:00 650 mg via ORAL
  Filled 2019-11-11: qty 2

## 2019-11-11 MED ORDER — REMDESIVIR 100 MG IV SOLR
100.0000 mg | Freq: Every day | INTRAVENOUS | Status: DC
Start: 2019-11-12 — End: 2019-11-11

## 2019-11-11 MED ORDER — GUAIFENESIN-DM 100-10 MG/5ML PO SYRP
10.0000 mL | ORAL_SOLUTION | ORAL | Status: DC | PRN
  Administered 2019-11-14: 10 mL via ORAL
  Filled 2019-11-11: qty 10

## 2019-11-11 MED ORDER — SODIUM CHLORIDE 0.9 % IV SOLN
100.0000 mg | INTRAVENOUS | Status: AC
  Administered 2019-11-11 (×2): 100 mg via INTRAVENOUS
  Filled 2019-11-11: qty 100
  Filled 2019-11-11: qty 20

## 2019-11-11 MED ORDER — ENOXAPARIN SODIUM 60 MG/0.6ML ~~LOC~~ SOLN
50.0000 mg | SUBCUTANEOUS | Status: DC
  Administered 2019-11-11 – 2019-11-15 (×5): 50 mg via SUBCUTANEOUS
  Filled 2019-11-11 (×6): qty 0.6

## 2019-11-11 MED ORDER — ACETAMINOPHEN 325 MG PO TABS
650.0000 mg | ORAL_TABLET | Freq: Four times a day (QID) | ORAL | Status: DC | PRN
  Administered 2019-11-11: 650 mg via ORAL
  Filled 2019-11-11: qty 2

## 2019-11-11 MED ORDER — LACTATED RINGERS IV SOLN: INTRAVENOUS | Status: AC

## 2019-11-11 MED ORDER — SODIUM CHLORIDE 0.9 % IV SOLN: 200.0000 mg | Freq: Once | INTRAVENOUS | Status: DC

## 2019-11-11 MED ORDER — METHYLPREDNISOLONE SODIUM SUCC 125 MG IJ SOLR
0.5000 mg/kg | Freq: Two times a day (BID) | INTRAMUSCULAR | Status: DC
  Administered 2019-11-11 – 2019-11-15 (×9): 51.875 mg via INTRAVENOUS
  Filled 2019-11-11 (×9): qty 2

## 2019-11-11 MED ORDER — HYDROCOD POLST-CPM POLST ER 10-8 MG/5ML PO SUER
5.0000 mL | Freq: Two times a day (BID) | ORAL | Status: DC | PRN
  Administered 2019-11-11 – 2019-11-14 (×5): 5 mL via ORAL
  Filled 2019-11-11 (×5): qty 5

## 2019-11-11 MED ORDER — SODIUM CHLORIDE 0.9 % IV SOLN: 250.0000 mL | INTRAVENOUS | Status: DC | PRN

## 2019-11-11 MED ORDER — ONDANSETRON HCL 4 MG/2ML IJ SOLN: 4.0000 mg | Freq: Four times a day (QID) | INTRAMUSCULAR | Status: DC | PRN

## 2019-11-11 MED ORDER — ZINC SULFATE 220 (50 ZN) MG PO CAPS
220.0000 mg | ORAL_CAPSULE | Freq: Every day | ORAL | Status: DC
  Administered 2019-11-11 – 2019-11-15 (×5): 220 mg via ORAL
  Filled 2019-11-11 (×5): qty 1

## 2019-11-11 MED ORDER — ASCORBIC ACID 500 MG PO TABS
500.0000 mg | ORAL_TABLET | Freq: Every day | ORAL | Status: DC
  Administered 2019-11-11 – 2019-11-15 (×5): 500 mg via ORAL
  Filled 2019-11-11 (×5): qty 1

## 2019-11-11 MED ORDER — SODIUM CHLORIDE 0.9% FLUSH: 3.0000 mL | INTRAVENOUS | Status: DC | PRN

## 2019-11-11 MED ORDER — ALBUTEROL SULFATE HFA 108 (90 BASE) MCG/ACT IN AERS
1.0000 | INHALATION_SPRAY | RESPIRATORY_TRACT | Status: DC | PRN
  Administered 2019-11-14: 2 via RESPIRATORY_TRACT
  Filled 2019-11-11: qty 6.7

## 2019-11-11 MED ORDER — ONDANSETRON HCL 4 MG PO TABS
4.0000 mg | ORAL_TABLET | Freq: Four times a day (QID) | ORAL | Status: DC | PRN
  Administered 2019-11-11: 4 mg via ORAL
  Filled 2019-11-11: qty 1

## 2019-11-11 MED ORDER — SODIUM CHLORIDE 0.9 % IV SOLN
100.0000 mg | Freq: Every day | INTRAVENOUS | Status: AC
  Administered 2019-11-12 – 2019-11-15 (×4): 100 mg via INTRAVENOUS
  Filled 2019-11-11 (×6): qty 20

## 2019-11-11 MED ORDER — SODIUM CHLORIDE 0.9% FLUSH
3.0000 mL | Freq: Two times a day (BID) | INTRAVENOUS | Status: DC
  Administered 2019-11-11 – 2019-11-15 (×6): 3 mL via INTRAVENOUS

## 2019-11-11 NOTE — ED Notes (Signed)
Upon initial assessment, Pt noted to be at 88% O2 Sats on Room Air. Pt placed on 2L Nasal Cannula with improvement to 93% O2.

## 2019-11-11 NOTE — ED Provider Notes (Signed)
[facility] EMERGENCY DEPARTMENT Provider Note   CSN: [id number] Arrival date & time: 11/11/19  0212   Time seen 3:10 AM  History Chief Complaint  Patient presents with  . Shortness of Breath    Ethan Schmidt is a 59 y.o. male.  HPI   Patient was seen in the ED on May 7 with complaints of chest pain and shortness of breath for 3 to 4 days and vomiting and diarrhea.  He was Covid positive and was admitted to the hospital.  He was discharged yesterday May 9.  He states he has had 2 more episodes of diarrhea since he left.  He states he woke up during the night and was very short of breath.  He states this is the first time he has felt short of breath.  He states he is having chills but denies fever.  He denies sore throat, rhinorrhea, or loss of taste or smell.  He has not had the Covid vaccine.  He states he has central chest pain mainly when he coughs.  Patient's pulse ox was 88% on room air.  PCP Patient, No Pcp Per   Past Medical History:  Diagnosis Date  . Arthritis   . Asthma   . Hypertension     Patient Active Problem List   Diagnosis Date Noted  . Tobacco abuse 06/29/2014  . CAP (community acquired pneumonia) 06/29/2014  . Asthma exacerbation 06/29/2014  . AKI (acute kidney injury) ([place]) 06/29/2014    Past Surgical History:  Procedure Laterality Date  . APPENDECTOMY         Family History  Problem Relation Age of Onset  . Heart attack Mother   . Hypertension Mother   . Heart attack Father   . Hypertension Sister   . Diabetes Sister   . Arthritis Sister   . Hypertension Brother   . Kidney disease Brother   . Diabetes Brother   . Heart disease Brother   . Arthritis Brother   . Hypertension Brother   . Cancer Brother        colon cancer  . Arthritis Brother   . Hypertension Brother   . Arthritis Brother   . Hypertension Brother   . Arthritis Brother   . Hypertension Sister   . Diabetes Sister   . Arthritis Sister   . Hypertension Sister   .  Diabetes Sister   . Arthritis Sister   . Hypertension Sister   . Diabetes Sister   . Arthritis Sister   . Hypertension Sister   . Diabetes Sister   . Arthritis Sister   . Hypertension Sister   . Diabetes Sister   . Arthritis Sister   . Hypertension Sister   . Arthritis Sister   . Hypertension Sister   . Heart attack Sister   . Arthritis Sister     Social History   Tobacco Use  . Smoking status: Never Smoker  . Smokeless tobacco: Never Used  Substance Use Topics  . Alcohol use: Yes    Comment: on holidays and special occ.  . Drug use: Yes    Types: Marijuana, Cocaine  lives at home  Home Medications Prior to Admission medications   Medication Sig Start Date End Date Taking? Authorizing Provider  amLODipine (NORVASC) 10 MG tablet Take 1 tablet (10 mg total) by mouth daily. 08/25/19   Milton Ferguson, MD  aspirin 81 MG tablet Take 81 mg by mouth daily.    [provider]  hydrochlorothiazide (HYDRODIURIL) 25 MG  tablet Take 1 tablet (25 mg total) by mouth daily. 08/25/19   Milton Ferguson, MD  lisinopril (ZESTRIL) 20 MG tablet Take 1 tablet (20 mg total) by mouth daily. 08/25/19   Milton Ferguson, MD  loperamide (IMODIUM) 2 MG capsule Take 1 capsule (2 mg total) by mouth as needed for diarrhea or loose stools. 11/10/19   Manuella Ghazi, Pratik D, DO  metoprolol tartrate (LOPRESSOR) 100 MG tablet Take 1 tablet (100 mg total) by mouth 2 (two) times daily. 08/25/19   Milton Ferguson, MD  ondansetron (ZOFRAN) 4 MG tablet Take 1 tablet (4 mg total) by mouth daily as needed for nausea or vomiting. 11/10/19 11/09/20  Manuella Ghazi, Pratik D, DO  potassium chloride (KLOR-CON) 10 MEQ tablet Take 1 tablet (10 mEq total) by mouth daily. 08/25/19   Milton Ferguson, MD    Allergies    Shellfish allergy and Omnipaque [iohexol]  Review of Systems   Review of Systems  All other systems reviewed and are negative.   Physical Exam Updated Vital Signs BP 100/83   Pulse 70   Temp 99 F (37.2 C) (Oral)   Resp  20   Ht 6\' 2"  (1.88 m)   Wt 104.3 kg   SpO2 96%   BMI 29.52 kg/m   Physical Exam Vitals and nursing note reviewed.  Constitutional:      General: He is in acute distress.     Appearance: He is obese.     Comments: Patient looks like he feels bad  HENT:     Head: Normocephalic and atraumatic.     Right Ear: External ear normal.     Left Ear: External ear normal.     Nose: Nose normal.     Mouth/Throat:     Mouth: Mucous membranes are dry.  Eyes:     Extraocular Movements: Extraocular movements intact.     Conjunctiva/sclera: Conjunctivae normal.     Pupils: Pupils are equal, round, and reactive to light.  Cardiovascular:     Rate and Rhythm: Normal rate and regular rhythm.  Pulmonary:     Comments: When I listen to patient I hear some scattered rales throughout his lungs Abdominal:     General: Abdomen is flat. Bowel sounds are normal.     Palpations: Abdomen is soft.     Tenderness: There is no abdominal tenderness.  Musculoskeletal:        General: Normal range of motion.     Cervical back: Normal range of motion.  Skin:    General: Skin is warm and dry.     Coloration: Skin is not jaundiced.  Neurological:     General: No focal deficit present.     Mental Status: He is alert and oriented to person, place, and time.     Cranial Nerves: No cranial nerve deficit.  Psychiatric:        Mood and Affect: Mood normal.        Behavior: Behavior normal.        Thought Content: Thought content normal.     ED Results / Procedures / Treatments   Labs (all labs ordered are listed, but only abnormal results are displayed) Results for orders placed or performed during the hospital encounter of 11/11/19  Blood Culture (routine x 2)   Specimen: BLOOD  Result Value Ref Range   Specimen Description BLOOD LEFT ANTECUBITAL    Special Requests      BOTTLES DRAWN AEROBIC AND ANAEROBIC Blood Culture adequate volume Performed at [facility]  [facility], [address], [place], [place]  [address]    Culture PENDING    Report Status PENDING   Blood Culture (routine x 2)   Specimen: BLOOD  Result Value Ref Range   Specimen Description BLOOD RIGHT ANTECUBITAL    Special Requests      BOTTLES DRAWN AEROBIC AND ANAEROBIC Blood Culture adequate volume Performed at [facility], [address], [place], [place] [address]    Culture PENDING    Report Status PENDING   Lactic acid, plasma  Result Value Ref Range   Lactic Acid, Venous 1.1 0.5 - 1.9 mmol/L  CBC WITH DIFFERENTIAL  Result Value Ref Range   WBC 7.4 4.0 - 10.5 K/uL   RBC 4.21 (L) 4.22 - 5.81 MIL/uL   Hemoglobin 13.0 13.0 - 17.0 g/dL   HCT 40.2 39.0 - 52.0 %   MCV 95.5 80.0 - 100.0 fL   MCH 30.9 26.0 - 34.0 pg   MCHC 32.3 30.0 - 36.0 g/dL   RDW 12.7 11.5 - 15.5 %   Platelets 297 150 - 400 K/uL   nRBC 0.0 0.0 - 0.2 %   Neutrophils Relative % 77 %   Neutro Abs 5.7 1.7 - 7.7 K/uL   Lymphocytes Relative 17 %   Lymphs Abs 1.3 0.7 - 4.0 K/uL   Monocytes Relative 6 %   Monocytes Absolute 0.4 0.1 - 1.0 K/uL   Eosinophils Relative 0 %   Eosinophils Absolute 0.0 0.0 - 0.5 K/uL   Basophils Relative 0 %   Basophils Absolute 0.0 0.0 - 0.1 K/uL   Immature Granulocytes 0 %   Abs Immature Granulocytes 0.02 0.00 - 0.07 K/uL  Comprehensive metabolic panel  Result Value Ref Range   Sodium 135 135 - 145 mmol/L   Potassium 3.6 3.5 - 5.1 mmol/L   Chloride 99 98 - 111 mmol/L   CO2 22 22 - 32 mmol/L   Glucose, Bld 100 (H) 70 - 99 mg/dL   BUN 22 (H) 6 - 20 mg/dL   Creatinine, Ser 2.19 (H) 0.61 - 1.24 mg/dL   Calcium 8.8 (L) 8.9 - 10.3 mg/dL   Total Protein 8.3 (H) 6.5 - 8.1 g/dL   Albumin 4.0 3.5 - 5.0 g/dL   AST 86 (H) 15 - 41 U/L   ALT 39 0 - 44 U/L   Alkaline Phosphatase 68 38 - 126 U/L   Total Bilirubin 1.1 0.3 - 1.2 mg/dL   GFR calc non Af Amer 32 (L) >60 mL/min   GFR calc Af Amer 38 (L) >60 mL/min   Anion gap 14 5 - 15  D-dimer, quantitative  Result Value Ref Range   D-Dimer, Quant 1.40 (H) 0.00 - 0.50  ug/mL-FEU  Procalcitonin  Result Value Ref Range   Procalcitonin 0.15 ng/mL  Lactate dehydrogenase  Result Value Ref Range   LDH 490 (H) 98 - 192 U/L  Triglycerides  Result Value Ref Range   Triglycerides 177 (H) <150 mg/dL  Fibrinogen  Result Value Ref Range   Fibrinogen 602 (H) 210 - 475 mg/dL  Urinalysis, Routine w reflex microscopic  Result Value Ref Range   Color, Urine YELLOW YELLOW   APPearance CLEAR CLEAR   Specific Gravity, Urine 1.020 1.005 - 1.030   pH 7.0 5.0 - 8.0   Glucose, UA NEGATIVE NEGATIVE mg/dL   Hgb urine dipstick LARGE (A) NEGATIVE   Bilirubin Urine NEGATIVE NEGATIVE   Ketones, ur NEGATIVE NEGATIVE mg/dL   Protein, ur 100 (A) NEGATIVE mg/dL  Nitrite NEGATIVE NEGATIVE   Leukocytes,Ua NEGATIVE NEGATIVE  Urine rapid drug screen (hosp performed)  Result Value Ref Range   Opiates POSITIVE (A) NONE DETECTED   Cocaine NONE DETECTED NONE DETECTED   Benzodiazepines NONE DETECTED NONE DETECTED   Amphetamines NONE DETECTED NONE DETECTED   Tetrahydrocannabinol POSITIVE (A) NONE DETECTED   Barbiturates NONE DETECTED NONE DETECTED  Urinalysis, Microscopic (reflex)  Result Value Ref Range   RBC / HPF 11-20 0 - 5 RBC/hpf   WBC, UA NONE SEEN 0 - 5 WBC/hpf   Bacteria, UA NONE SEEN NONE SEEN   Squamous Epithelial / LPF NONE SEEN 0 - 5   Laboratory interpretation all normal except renal insufficiency that is consistent with baseline, elevated D-dimer, elevated LDH, triglycerides, and fibrinogen.    EKG EKG Interpretation  Date/Time:  Monday Nov 11 2019 04:10:47 EDT Ventricular Rate:  82 PR Interval:    QRS Duration: 80 QT Interval:  378 QTC Calculation: 442 R Axis:   53 Text Interpretation: Sinus rhythm Probable left atrial enlargement Borderline repolarization abnormality Since last tracing rate faster Confirmed by Rolland Porter [id number]) on 11/11/2019 6:49:27 AM     Radiology DG Chest Port 1 View  Result Date: 11/11/2019 CLINICAL DATA:  COVID-19 positive,  short of breath, cough EXAM: PORTABLE CHEST 1 VIEW COMPARISON:  08/25/2019 FINDINGS: Single frontal view of the chest demonstrates basilar predominant interstitial and ground-glass opacities, increased since prior study. No effusion or pneumothorax. Cardiac silhouette is stable. IMPRESSION: 1. Progressive bibasilar interstitial and ground-glass opacities consistent with multifocal pneumonia, compatible with history of COVID-19. Electronically Signed   By: Randa Ngo M.D.   On: 11/11/2019 02:54    Procedures .Critical Care Performed by: Rolland Porter, MD Authorized by: Rolland Porter, MD   Critical care provider statement:    Critical care time (minutes):  36   Critical care was necessary to treat or prevent imminent or life-threatening deterioration of the following conditions:  Respiratory failure   Critical care was time spent personally by me on the following activities:  Discussions with consultants, examination of patient, obtaining history from patient or surrogate, ordering and review of radiographic studies, ordering and review of laboratory studies, pulse oximetry, re-evaluation of patient's condition and review of old charts   (including critical care time)  Medications Ordered in ED Medications  sodium chloride flush (NS) 0.9 % injection 3 mL (has no administration in time range)  sodium chloride flush (NS) 0.9 % injection 3 mL (has no administration in time range)  0.9 %  sodium chloride infusion (has no administration in time range)  acetaminophen (TYLENOL) tablet 650 mg (650 mg Oral Given 11/11/19 0429)    ED Course  I have reviewed the triage vital signs and the nursing notes.  Pertinent labs & imaging results that were available during my care of the patient were reviewed by me and considered in my medical decision making (see chart for details).    MDM Rules/Calculators/A&P                     Patient was placed on 2 L/min nasal cannula oxygen because his pulse ox was 88%  on room air.  During my exam on oxygen his pulse ox was 89 to 93%.  His oxygen was increased.  IV was inserted and laboratory testing was done.  His chest x-ray shows progression of his bilateral infiltrates that he had before.  Patient's oxygen was increased to 4 L/min nasal cannula and his oxygen  stayed around 96%.  Laboratory testing was done.  Patient's labs are pretty normal except for an underlying renal insufficiency.  His D-dimer was elevated however his chest pain was all chest wall pain from coughing and his x-ray is consistent with Covid pneumonia.  His creatinine is high and that he could not get a CTA.  I will talk to the hospitalist about readmitting.  6:06 AM Dr. Humphrey Rolls, hospitalist will admit  Ethan Schmidt was evaluated in Emergency Department on 11/11/2019 for the symptoms described in the history of present illness. He was evaluated in the context of the global COVID-19 pandemic, which necessitated consideration that the patient might be at risk for infection with the SARS-CoV-2 virus that causes COVID-19. Institutional protocols and algorithms that pertain to the evaluation of patients at risk for COVID-19 are in a state of rapid change based on information released by regulatory bodies including the CDC and federal and state organizations. These policies and algorithms were followed during the patient's care in the ED.   Final Clinical Impression(s) / ED Diagnoses Final diagnoses:  Pneumonia due to COVID-19 virus  Hypoxia  Diarrhea, unspecified type    Rx / DC Orders  Plan admission  Rolland Porter, MD, Barbette Or, MD 11/11/19 [phone]

## 2019-11-11 NOTE — ED Notes (Signed)
ED Provider at bedside. 

## 2019-11-11 NOTE — ED Triage Notes (Signed)
Pt arrived via [facility] from home c/o SOB. Pt recently hospitalized and discharged for same. Pt Covid Positive with congested cough.

## 2019-11-11 NOTE — Plan of Care (Signed)

## 2019-11-11 NOTE — Care Plan (Signed)
Pt refused to put bed in lowest position. Pt stated "their are devils down there". I educated pt on the risk of falling and safety concerns.

## 2019-11-11 NOTE — H&P (Signed)
History and Physical    Ethan Schmidt FXT:[id number] DOB: 04-Jul-1961 DOA: 11/11/2019  PCP: Patient, No Pcp Per   Patient coming from: Home  Chief Complaint: Shortness of breath  HPI: Ethan Schmidt is a 59 y.o. male with medical history significant for asthma, hypertension, and CKD stage IIIa who was recently discharged on 5/9 after improvement in gastrointestinal complaints related to positive Covid status.  He was not noted to have any signs of pneumonia or respiratory distress at that time and was tolerating a diet.  He had IV fluid hydration and his AKI had resolved as well and he was stable for discharge.  Unfortunately overnight he began to develop worsening shortness of breath which he had not felt before.  He was also having some chills, but denied any fevers.  He denies any rhinorrhea, sore throat, or loss of taste or smell.  He does have some centralized chest pain, but mainly when he coughs.   ED Course: Stable vital signs noted and patient is afebrile.  His pulse oximetry was 88% on room air on arrival to the ED.  His labs indicate a recurrence of AKI with creatinine of 2.19 and procalcitonin is 0.15.  EKG with sinus rhythm and chest x-ray now demonstrates multifocal pneumonia.  Urine analysis positive for THC and opiates.  He is now requiring 4 L nasal cannula oxygen.  He denies any current chest pain or hemoptysis.  He has only received some IV fluid and Tylenol thus far.  Review of Systems: All others reviewed as noted above and otherwise negative.  Past Medical History:  Diagnosis Date  . Arthritis   . Asthma   . Hypertension     Past Surgical History:  Procedure Laterality Date  . APPENDECTOMY       reports that he has never smoked. He has never used smokeless tobacco. He reports current alcohol use. He reports current drug use. Drugs: Marijuana and Cocaine.  Allergies  Allergen Reactions  . Shellfish Allergy Anaphylaxis  . Omnipaque [Iohexol] Hives and Swelling     Pt. Did fine with 1 hour pre medication.    Family History  Problem Relation Age of Onset  . Heart attack Mother   . Hypertension Mother   . Heart attack Father   . Hypertension Sister   . Diabetes Sister   . Arthritis Sister   . Hypertension Brother   . Kidney disease Brother   . Diabetes Brother   . Heart disease Brother   . Arthritis Brother   . Hypertension Brother   . Cancer Brother        colon cancer  . Arthritis Brother   . Hypertension Brother   . Arthritis Brother   . Hypertension Brother   . Arthritis Brother   . Hypertension Sister   . Diabetes Sister   . Arthritis Sister   . Hypertension Sister   . Diabetes Sister   . Arthritis Sister   . Hypertension Sister   . Diabetes Sister   . Arthritis Sister   . Hypertension Sister   . Diabetes Sister   . Arthritis Sister   . Hypertension Sister   . Diabetes Sister   . Arthritis Sister   . Hypertension Sister   . Arthritis Sister   . Hypertension Sister   . Heart attack Sister   . Arthritis Sister     Prior to Admission medications   Medication Sig Start Date End Date Taking? Authorizing Provider  amLODipine (NORVASC) 10 MG tablet Take  1 tablet (10 mg total) by mouth daily. 08/25/19   Milton Ferguson, MD  aspirin 81 MG tablet Take 81 mg by mouth daily.    [provider]  hydrochlorothiazide (HYDRODIURIL) 25 MG tablet Take 1 tablet (25 mg total) by mouth daily. 08/25/19   Milton Ferguson, MD  lisinopril (ZESTRIL) 20 MG tablet Take 1 tablet (20 mg total) by mouth daily. 08/25/19   Milton Ferguson, MD  loperamide (IMODIUM) 2 MG capsule Take 1 capsule (2 mg total) by mouth as needed for diarrhea or loose stools. 11/10/19   Manuella Ghazi, Jakari Jacot D, DO  metoprolol tartrate (LOPRESSOR) 100 MG tablet Take 1 tablet (100 mg total) by mouth 2 (two) times daily. 08/25/19   Milton Ferguson, MD  ondansetron (ZOFRAN) 4 MG tablet Take 1 tablet (4 mg total) by mouth daily as needed for nausea or vomiting. 11/10/19 11/09/20  Manuella Ghazi, Canary Fister D,  DO  potassium chloride (KLOR-CON) 10 MEQ tablet Take 1 tablet (10 mEq total) by mouth daily. 08/25/19   Milton Ferguson, MD    Physical Exam: Vitals:   11/11/19 0410 11/11/19 0422 11/11/19 0428 11/11/19 0600  BP: 131/86   100/83  Pulse: 85 69 84 70  Resp: 18 15 20 20   Temp:      TempSrc:      SpO2: 93% 96% 96% 96%  Weight:      Height:        Constitutional: NAD, calm, comfortable Vitals:   11/11/19 0410 11/11/19 0422 11/11/19 0428 11/11/19 0600  BP: 131/86   100/83  Pulse: 85 69 84 70  Resp: 18 15 20 20   Temp:      TempSrc:      SpO2: 93% 96% 96% 96%  Weight:      Height:       Eyes: lids and conjunctivae normal ENMT: Mucous membranes are moist.  Neck: normal, supple Respiratory: clear to auscultation bilaterally. Normal respiratory effort. No accessory muscle use.  Currently on 4 L nasal cannula oxygen. Cardiovascular: Regular rate and rhythm, no murmurs. No extremity edema. Abdomen: no tenderness, no distention. Bowel sounds positive.  Musculoskeletal:  No joint deformity upper and lower extremities.   Skin: no rashes, lesions, ulcers.  Psychiatric: Flat affect  Labs on Admission: I have personally reviewed following labs and imaging studies  CBC: Recent Labs  Lab 11/08/19 1048 11/09/19 0733 11/10/19 0710 11/11/19 0430  WBC 8.0 6.3 6.9 7.4  NEUTROABS  --   --   --  5.7  HGB 13.3 12.6* 11.9* 13.0  HCT 40.4 39.2 37.6* 40.2  MCV 95.5 96.1 96.9 95.5  PLT 294 273 267 672   Basic Metabolic Panel: Recent Labs  Lab 11/08/19 1048 11/09/19 0733 11/10/19 0710 11/11/19 0430  NA 137 135 136 135  K 3.2* 3.4* 3.4* 3.6  CL 94* 99 102 99  CO2 27 27 25 22   GLUCOSE 138* 96 103* 100*  BUN 19 17 16  22*  CREATININE 2.44* 1.95* 1.94* 2.19*  CALCIUM 8.8* 8.0* 7.9* 8.8*  MG  --  1.8 1.8  --    GFR: Estimated Creatinine Clearance: 48.5 mL/min (A) (by C-G formula based on SCr of 2.19 mg/dL (H)). Liver Function Tests: Recent Labs  Lab 11/08/19 1048 11/09/19 0733  11/11/19 0430  AST 54* 64* 86*  ALT 26 28 39  ALKPHOS 72 62 68  BILITOT 0.8 0.8 1.1  PROT 8.2* 7.1 8.3*  ALBUMIN 3.9 3.5 4.0   No results for input(s): LIPASE, AMYLASE in the last  168 hours. No results for input(s): AMMONIA in the last 168 hours. Coagulation Profile: No results for input(s): INR, PROTIME in the last 168 hours. Cardiac Enzymes: No results for input(s): CKTOTAL, CKMB, CKMBINDEX, TROPONINI in the last 168 hours. BNP (last 3 results) No results for input(s): PROBNP in the last 8760 hours. HbA1C: No results for input(s): HGBA1C in the last 72 hours. CBG: No results for input(s): GLUCAP in the last 168 hours. Lipid Profile: Recent Labs    11/11/19 0430  TRIG 177*   Thyroid Function Tests: No results for input(s): TSH, T4TOTAL, FREET4, T3FREE, THYROIDAB in the last 72 hours. Anemia Panel: No results for input(s): VITAMINB12, FOLATE, FERRITIN, TIBC, IRON, RETICCTPCT in the last 72 hours. Urine analysis:    Component Value Date/Time   COLORURINE YELLOW 11/11/2019 [place] 11/11/2019 0353   LABSPEC 1.020 11/11/2019 0353   PHURINE 7.0 11/11/2019 0353   GLUCOSEU NEGATIVE 11/11/2019 0353   HGBUR LARGE (A) 11/11/2019 0353   BILIRUBINUR NEGATIVE 11/11/2019 0353   KETONESUR NEGATIVE 11/11/2019 0353   PROTEINUR 100 (A) 11/11/2019 0353   UROBILINOGEN 0.2 11/23/2011 0130   NITRITE NEGATIVE 11/11/2019 0353   LEUKOCYTESUR NEGATIVE 11/11/2019 0353    Radiological Exams on Admission: DG Chest Port 1 View  Result Date: 11/11/2019 CLINICAL DATA:  COVID-19 positive, short of breath, cough EXAM: PORTABLE CHEST 1 VIEW COMPARISON:  08/25/2019 FINDINGS: Single frontal view of the chest demonstrates basilar predominant interstitial and ground-glass opacities, increased since prior study. No effusion or pneumothorax. Cardiac silhouette is stable. IMPRESSION: 1. Progressive bibasilar interstitial and ground-glass opacities consistent with multifocal pneumonia,  compatible with history of COVID-19. Electronically Signed   By: Randa Ngo M.D.   On: 11/11/2019 02:54    EKG: Independently reviewed.  82 bpm sinus rhythm.  Assessment/Plan Active Problems:   Pneumonia due to 2019 novel coronavirus    Acute hypoxemic respiratory failure secondary to COVID-19 pneumonia -IV remdesivir and dexamethasone as ordered -Follow inflammatory markers -Isolation precautions -Vitamin C and zinc -Robitussin for cough and breathing treatments ordered as needed -Wean oxygen as tolerated  AKI on CKD stage IIIa -Continue IV fluid as this appears all prerenal -Monitor repeat labs -Avoid nephrotoxic agents  Gastrointestinal distress with diarrhea secondary to Covid -Continue Imodium as needed -Zofran as needed for any potential nausea or vomiting -Attempt heart healthy diet -Noted to be THC positive, and will need counseling on cessation as this may be a possible cause of his GI symptoms  History of hypertension -Currently with soft blood pressure readings -Plan to hold amlodipine, metoprolol, lisinopril, and HCTZ for now and monitor  History of asthma -Albuterol treatments every 6 hours as needed -No active wheezing currently noted   DVT prophylaxis: Lovenox Code Status: Full Family Communication: None at bedside Disposition Plan: Admit for treatment of Covid pneumonia Consults called: None Admission status: Inpatient, MedSurg Status is: Inpatient  Remains inpatient appropriate because:Ongoing diagnostic testing needed not appropriate for outpatient work up and IV treatments appropriate due to intensity of illness or inability to take PO   Dispo: The patient is from: Home              Anticipated d/c is to: Home              Anticipated d/c date is: 2 days              Patient currently is not medically stable to d/c.  Patient will require inpatient Covid pneumonia treatment with IV remdesivir  and steroids as well as breathing  treatments.   Alezander Dimaano D Manuella Ghazi DO [facility]  If 7PM-7AM, please contact night-coverage [email or web address]  11/11/2019, 7:43 AM

## 2019-11-12 LAB — CBC WITH DIFFERENTIAL/PLATELET
Abs Immature Granulocytes: 0.02 10*3/uL (ref 0.00–0.07)
Basophils Absolute: 0 10*3/uL (ref 0.0–0.1)
Basophils Relative: 0 %
Eosinophils Absolute: 0 10*3/uL (ref 0.0–0.5)
Eosinophils Relative: 0 %
HCT: 35.6 % — ABNORMAL LOW (ref 39.0–52.0)
Hemoglobin: 11.4 g/dL — ABNORMAL LOW (ref 13.0–17.0)
Immature Granulocytes: 0 %
Lymphocytes Relative: 16 %
Lymphs Abs: 0.9 10*3/uL (ref 0.7–4.0)
MCH: 30.9 pg (ref 26.0–34.0)
MCHC: 32 g/dL (ref 30.0–36.0)
MCV: 96.5 fL (ref 80.0–100.0)
Monocytes Absolute: 0.4 10*3/uL (ref 0.1–1.0)
Monocytes Relative: 7 %
Neutro Abs: 4.5 10*3/uL (ref 1.7–7.7)
Neutrophils Relative %: 77 %
Platelets: 333 10*3/uL (ref 150–400)
RBC: 3.69 MIL/uL — ABNORMAL LOW (ref 4.22–5.81)
RDW: 12.3 % (ref 11.5–15.5)
WBC: 5.9 10*3/uL (ref 4.0–10.5)
nRBC: 0 % (ref 0.0–0.2)

## 2019-11-12 LAB — COMPREHENSIVE METABOLIC PANEL
ALT: 39 U/L (ref 0–44)
AST: 66 U/L — ABNORMAL HIGH (ref 15–41)
Albumin: 2.8 g/dL — ABNORMAL LOW (ref 3.5–5.0)
Alkaline Phosphatase: 57 U/L (ref 38–126)
Anion gap: 10 (ref 5–15)
BUN: 27 mg/dL — ABNORMAL HIGH (ref 6–20)
CO2: 26 mmol/L (ref 22–32)
Calcium: 8.6 mg/dL — ABNORMAL LOW (ref 8.9–10.3)
Chloride: 102 mmol/L (ref 98–111)
Creatinine, Ser: 1.65 mg/dL — ABNORMAL HIGH (ref 0.61–1.24)
GFR calc Af Amer: 53 mL/min — ABNORMAL LOW (ref >60)
GFR calc non Af Amer: 46 mL/min — ABNORMAL LOW (ref >60)
Glucose, Bld: 141 mg/dL — ABNORMAL HIGH (ref 70–99)
Potassium: 4.5 mmol/L (ref 3.5–5.1)
Sodium: 138 mmol/L (ref 135–145)
Total Bilirubin: 0.6 mg/dL (ref 0.3–1.2)
Total Protein: 6.5 g/dL (ref 6.5–8.1)

## 2019-11-12 LAB — URINE CULTURE: Culture: NO GROWTH

## 2019-11-12 LAB — MAGNESIUM: Magnesium: 2.1 mg/dL (ref 1.7–2.4)

## 2019-11-12 LAB — FERRITIN: Ferritin: 2256 ng/mL — ABNORMAL HIGH (ref 24–336)

## 2019-11-12 LAB — C-REACTIVE PROTEIN: CRP: 4.2 mg/dL — ABNORMAL HIGH (ref <1.0)

## 2019-11-12 LAB — D-DIMER, QUANTITATIVE: D-Dimer, Quant: 0.65 ug/mL-FEU — ABNORMAL HIGH (ref 0.00–0.50)

## 2019-11-12 MED ORDER — MELATONIN 5 MG PO TABS
10.0000 mg | ORAL_TABLET | Freq: Every day | ORAL | Status: DC
  Filled 2019-11-12: qty 2

## 2019-11-12 MED ORDER — MELATONIN 3 MG PO TABS
9.0000 mg | ORAL_TABLET | Freq: Every day | ORAL | Status: AC
  Administered 2019-11-12: 01:00:00 9 mg via ORAL
  Filled 2019-11-12: qty 3

## 2019-11-12 NOTE — Progress Notes (Signed)
PROGRESS NOTE    Ethan Schmidt  JEH:[id number] DOB: February 12, 1961 DOA: 11/11/2019 PCP: Patient, No Pcp Per   Brief Narrative:  Per HPI: Ethan Schmidt is a 59 y.o. male with medical history significant for asthma, hypertension, and CKD stage IIIa who was recently discharged on 5/9 after improvement in gastrointestinal complaints related to positive Covid status.  He was not noted to have any signs of pneumonia or respiratory distress at that time and was tolerating a diet.  He had IV fluid hydration and his AKI had resolved as well and he was stable for discharge.  Unfortunately overnight he began to develop worsening shortness of breath which he had not felt before.  He was also having some chills, but denied any fevers.  He denies any rhinorrhea, sore throat, or loss of taste or smell.  He does have some centralized chest pain, but mainly when he coughs.  -Patient admitted with acute hypoxemic respiratory failure secondary to COVID-19 pneumonia and continues to remain on 4 L nasal cannula oxygen.  Assessment & Plan:   Active Problems:   Pneumonia due to 2019 novel coronavirus   Acute hypoxemic respiratory failure secondary to COVID-19 pneumonia -IV remdesivir and dexamethasone as ordered -Follow inflammatory markers -Isolation precautions -Vitamin C and zinc -Robitussin for cough and breathing treatments ordered as needed -Wean oxygen as tolerated to room air  AKI on CKD stage IIIa -Continue IV fluid as this appears all prerenal -Monitor repeat labs -Avoid nephrotoxic agents  Gastrointestinal distress with diarrhea secondary to Covid -Continue Imodium as needed -Zofran as needed for any potential nausea or vomiting -Attempt heart healthy diet -Noted to be THC positive, and will need counseling on cessation as this may be a possible cause of his GI symptoms  History of hypertension -Currently with soft blood pressure readings -Plan to hold amlodipine, metoprolol, lisinopril,  and HCTZ for now and monitor  History of asthma -Albuterol treatments every 6 hours as needed -No active wheezing currently noted   DVT prophylaxis: Lovenox Code Status: Full Family Communication: None at bedside Disposition Plan:  Status is: Inpatient  Remains inpatient appropriate because:Ongoing diagnostic testing needed not appropriate for outpatient work up and IV treatments appropriate due to intensity of illness    Dispo: The patient is from: Home  Anticipated d/c is to: Home  Anticipated d/c date is: 1-2 days  Patient currently is not medically stable to d/c.  Patient will require inpatient Covid pneumonia treatment with IV remdesivir and steroids as well as breathing treatments.  Consultants:   None  Procedures:   None  Antimicrobials:  Anti-infectives (From admission, onward)   Start     Dose/Rate Route Frequency Ordered Stop   11/12/19 1000  remdesivir 100 mg in sodium chloride 0.9 % 100 mL IVPB  Status:  Discontinued     100 mg 200 mL/hr over 30 Minutes Intravenous Daily 11/11/19 0756 11/11/19 0807   11/12/19 1000  remdesivir 100 mg in sodium chloride 0.9 % 100 mL IVPB     100 mg 200 mL/hr over 30 Minutes Intravenous Daily 11/11/19 0807 11/16/19 0959   11/11/19 0900  remdesivir 100 mg in sodium chloride 0.9 % 100 mL IVPB     100 mg 200 mL/hr over 30 Minutes Intravenous Every 1 hr x 2 11/11/19 0807 11/11/19 1030   11/11/19 0800  remdesivir 200 mg in sodium chloride 0.9% 250 mL IVPB  Status:  Discontinued     200 mg 580 mL/hr over 30 Minutes Intravenous Once 11/11/19 0756  11/11/19 0807       Subjective: Patient seen and evaluated today with no new acute complaints or concerns. No acute concerns or events noted overnight.  He denies any chest pain or shortness of breath.  Objective: Vitals:   11/11/19 1022 11/11/19 2141 11/12/19 0642 11/12/19 0928  BP: 130/77 122/86 (!) 135/94   Pulse: 77 62 64   Resp:  19 20    Temp: 98.6 F (37 C) 98.8 F (37.1 C) 97.9 F (36.6 C)   TempSrc: Oral     SpO2: 96% 92% 98% 96%  Weight:      Height:        Intake/Output Summary (Last 24 hours) at 11/12/2019 1558 Last data filed at 11/12/2019 1250 Gross per 24 hour  Intake 1070 ml  Output 800 ml  Net 270 ml   Filed Weights   11/11/19 0227  Weight: 104.3 kg    Examination:  General exam: Appears calm and comfortable  Respiratory system: Clear to auscultation. Respiratory effort normal.  Currently on 4 L nasal cannula oxygen. Cardiovascular system: S1 & S2 heard, RRR. No JVD, murmurs, rubs, gallops or clicks. No pedal edema. Gastrointestinal system: Abdomen is nondistended, soft and nontender. No organomegaly or masses felt. Normal bowel sounds heard. Central nervous system: Alert and oriented. No focal neurological deficits. Extremities: Symmetric 5 x 5 power. Skin: No rashes, lesions or ulcers Psychiatry: Judgement and insight appear normal. Mood & affect appropriate.     Data Reviewed: I have personally reviewed following labs and imaging studies  CBC: Recent Labs  Lab 11/08/19 1048 11/09/19 0733 11/10/19 0710 11/11/19 0430 11/12/19 0536  WBC 8.0 6.3 6.9 7.4 5.9  NEUTROABS  --   --   --  5.7 4.5  HGB 13.3 12.6* 11.9* 13.0 11.4*  HCT 40.4 39.2 37.6* 40.2 35.6*  MCV 95.5 96.1 96.9 95.5 96.5  PLT 294 273 267 297 462   Basic Metabolic Panel: Recent Labs  Lab 11/08/19 1048 11/09/19 0733 11/10/19 0710 11/11/19 0430 11/12/19 0536  NA 137 135 136 135 138  K 3.2* 3.4* 3.4* 3.6 4.5  CL 94* 99 102 99 102  CO2 27 27 25 22 26   GLUCOSE 138* 96 103* 100* 141*  BUN 19 17 16  22* 27*  CREATININE 2.44* 1.95* 1.94* 2.19* 1.65*  CALCIUM 8.8* 8.0* 7.9* 8.8* 8.6*  MG  --  1.8 1.8  --  2.1   GFR: Estimated Creatinine Clearance: 64.3 mL/min (A) (by C-G formula based on SCr of 1.65 mg/dL (H)). Liver Function Tests: Recent Labs  Lab 11/08/19 1048 11/09/19 0733 11/11/19 0430 11/12/19 0536  AST  54* 64* 86* 66*  ALT 26 28 39 39  ALKPHOS 72 62 68 57  BILITOT 0.8 0.8 1.1 0.6  PROT 8.2* 7.1 8.3* 6.5  ALBUMIN 3.9 3.5 4.0 2.8*   No results for input(s): LIPASE, AMYLASE in the last 168 hours. No results for input(s): AMMONIA in the last 168 hours. Coagulation Profile: No results for input(s): INR, PROTIME in the last 168 hours. Cardiac Enzymes: No results for input(s): CKTOTAL, CKMB, CKMBINDEX, TROPONINI in the last 168 hours. BNP (last 3 results) No results for input(s): PROBNP in the last 8760 hours. HbA1C: No results for input(s): HGBA1C in the last 72 hours. CBG: No results for input(s): GLUCAP in the last 168 hours. Lipid Profile: Recent Labs    11/11/19 0430  TRIG 177*   Thyroid Function Tests: No results for input(s): TSH, T4TOTAL, FREET4, T3FREE, THYROIDAB in the  last 72 hours. Anemia Panel: Recent Labs    11/11/19 0430 11/12/19 0536  FERRITIN 4,112* 2,256*   Sepsis Labs: Recent Labs  Lab 11/11/19 0430 11/11/19 0829  PROCALCITON 0.15  --   LATICACIDVEN 1.1 0.8    Recent Results (from the past 240 hour(s))  Respiratory Panel by RT PCR (Flu A&B, Covid) - Nasopharyngeal Swab     Status: Abnormal   Collection Time: 11/08/19 12:02 PM   Specimen: Nasopharyngeal Swab  Result Value Ref Range Status   SARS Coronavirus 2 by RT PCR POSITIVE (A) NEGATIVE Final    Comment: CRITICAL RESULT CALLED TO, READ BACK BY AND VERIFIED WITH: SHORE L. AT [id number] ON [id number] BY THOMPSON S. (NOTE) SARS-CoV-2 target nucleic acids are DETECTED. SARS-CoV-2 RNA is generally detectable in upper respiratory specimens  during the acute phase of infection. Positive results are indicative of the presence of the identified virus, but do not rule out bacterial infection or co-infection with other pathogens not detected by the test. Clinical correlation with patient history and other diagnostic information is necessary to determine patient infection status. The expected result is  Negative. Fact Sheet for Patients:  PinkCheek.be Fact Sheet for Healthcare Providers: GravelBags.it This test is not yet approved or cleared by the [place] FDA and  has been authorized for detection and/or diagnosis of SARS-CoV-2 by FDA under an Emergency Use Authorization (EUA).  This EUA will remain in effect (meaning this test  can be used) for the duration of  the COVID-19 declaration under Section 564(b)(1) of the Act, 21 U.S.C. section 360bbb-3(b)(1), unless the authorization is terminated or revoked sooner.    Influenza A by PCR NEGATIVE NEGATIVE Final   Influenza B by PCR NEGATIVE NEGATIVE Final    Comment: (NOTE) The Xpert Xpress SARS-CoV-2/FLU/RSV assay is intended as an aid in  the diagnosis of influenza from Nasopharyngeal swab specimens and  should not be used as a sole basis for treatment. Nasal washings and  aspirates are unacceptable for Xpert Xpress SARS-CoV-2/FLU/RSV  testing. Fact Sheet for Patients: PinkCheek.be Fact Sheet for Healthcare Providers: GravelBags.it This test is not yet approved or cleared by the [place] FDA and  has been authorized for detection and/or diagnosis of SARS-CoV-2 by  FDA under an Emergency Use Authorization (EUA). This EUA will remain  in effect (meaning this test can be used) for the duration of the  Covid-19 declaration under Section 564(b)(1) of the Act, 21  U.S.C. section 360bbb-3(b)(1), unless the authorization is  terminated or revoked. Performed at [facility], [address] [place], [place] [address]   Urine culture     Status: None   Collection Time: 11/11/19  3:53 AM   Specimen: Urine, Clean Catch  Result Value Ref Range Status   Specimen Description   Final    URINE, CLEAN CATCH Performed at [facility], [address] [place], [place] [address]    Special Requests   Final     NONE Performed at [facility], [address], [place], [place] [address]    Culture   Final    NO GROWTH Performed at [facility], [place] [address], [place], [place] [address]    Report Status 11/12/2019 FINAL  Final  Blood Culture (routine x 2)     Status: None (Preliminary result)   Collection Time: 11/11/19  4:15 AM   Specimen: BLOOD  Result Value Ref Range Status   Specimen Description BLOOD LEFT ANTECUBITAL  Final   Special Requests  Final    BOTTLES DRAWN AEROBIC AND ANAEROBIC Blood Culture adequate volume   Culture   Final    NO GROWTH 1 DAY Performed at [facility], [address] [place], [place] [address]    Report Status PENDING  Incomplete  Blood Culture (routine x 2)     Status: None (Preliminary result)   Collection Time: 11/11/19  4:30 AM   Specimen: BLOOD  Result Value Ref Range Status   Specimen Description BLOOD RIGHT ANTECUBITAL  Final   Special Requests   Final    BOTTLES DRAWN AEROBIC AND ANAEROBIC Blood Culture adequate volume   Culture   Final    NO GROWTH 1 DAY Performed at [facility], [address] [place], [place] [address]    Report Status PENDING  Incomplete         Radiology Studies: DG Chest Port 1 View  Result Date: 11/11/2019 CLINICAL DATA:  COVID-19 positive, short of breath, cough EXAM: PORTABLE CHEST 1 VIEW COMPARISON:  08/25/2019 FINDINGS: Single frontal view of the chest demonstrates basilar predominant interstitial and ground-glass opacities, increased since prior study. No effusion or pneumothorax. Cardiac silhouette is stable. IMPRESSION: 1. Progressive bibasilar interstitial and ground-glass opacities consistent with multifocal pneumonia, compatible with history of COVID-19. Electronically Signed   By: Randa Ngo M.D.   On: 11/11/2019 02:54        Scheduled Meds: . vitamin C  500 mg Oral Daily  . enoxaparin (LOVENOX) injection  50 mg Subcutaneous Q24H  . methylPREDNISolone (SOLU-MEDROL) injection  0.5 mg/kg  Intravenous Q12H  . sodium chloride flush  3 mL Intravenous Q12H  . zinc sulfate  220 mg Oral Daily   Continuous Infusions: . sodium chloride    . remdesivir 100 mg in NS 100 mL 100 mg (11/12/19 1400)     LOS: 1 day    Time spent: 35 minutes    Gopal Malter Darleen Crocker, DO [facility]  If 7PM-7AM, please contact night-coverage [email or web address] 11/12/2019, 3:58 PM

## 2019-11-13 LAB — CBC WITH DIFFERENTIAL/PLATELET
Abs Immature Granulocytes: 0.11 10*3/uL — ABNORMAL HIGH (ref 0.00–0.07)
Basophils Absolute: 0 10*3/uL (ref 0.0–0.1)
Basophils Relative: 0 %
Eosinophils Absolute: 0 10*3/uL (ref 0.0–0.5)
Eosinophils Relative: 0 %
HCT: 35.4 % — ABNORMAL LOW (ref 39.0–52.0)
Hemoglobin: 11.4 g/dL — ABNORMAL LOW (ref 13.0–17.0)
Immature Granulocytes: 1 %
Lymphocytes Relative: 7 %
Lymphs Abs: 1.1 10*3/uL (ref 0.7–4.0)
MCH: 31.1 pg (ref 26.0–34.0)
MCHC: 32.2 g/dL (ref 30.0–36.0)
MCV: 96.7 fL (ref 80.0–100.0)
Monocytes Absolute: 0.9 10*3/uL (ref 0.1–1.0)
Monocytes Relative: 5 %
Neutro Abs: 14.3 10*3/uL — ABNORMAL HIGH (ref 1.7–7.7)
Neutrophils Relative %: 87 %
Platelets: 412 10*3/uL — ABNORMAL HIGH (ref 150–400)
RBC: 3.66 MIL/uL — ABNORMAL LOW (ref 4.22–5.81)
RDW: 12.4 % (ref 11.5–15.5)
WBC: 16.4 10*3/uL — ABNORMAL HIGH (ref 4.0–10.5)
nRBC: 0 % (ref 0.0–0.2)

## 2019-11-13 LAB — COMPREHENSIVE METABOLIC PANEL
ALT: 48 U/L — ABNORMAL HIGH (ref 0–44)
AST: 60 U/L — ABNORMAL HIGH (ref 15–41)
Albumin: 3 g/dL — ABNORMAL LOW (ref 3.5–5.0)
Alkaline Phosphatase: 59 U/L (ref 38–126)
Anion gap: 9 (ref 5–15)
BUN: 31 mg/dL — ABNORMAL HIGH (ref 6–20)
CO2: 28 mmol/L (ref 22–32)
Calcium: 8.8 mg/dL — ABNORMAL LOW (ref 8.9–10.3)
Chloride: 103 mmol/L (ref 98–111)
Creatinine, Ser: 1.8 mg/dL — ABNORMAL HIGH (ref 0.61–1.24)
GFR calc Af Amer: 48 mL/min — ABNORMAL LOW (ref >60)
GFR calc non Af Amer: 41 mL/min — ABNORMAL LOW (ref >60)
Glucose, Bld: 137 mg/dL — ABNORMAL HIGH (ref 70–99)
Potassium: 4 mmol/L (ref 3.5–5.1)
Sodium: 140 mmol/L (ref 135–145)
Total Bilirubin: 0.6 mg/dL (ref 0.3–1.2)
Total Protein: 6.6 g/dL (ref 6.5–8.1)

## 2019-11-13 LAB — FERRITIN: Ferritin: 1336 ng/mL — ABNORMAL HIGH (ref 24–336)

## 2019-11-13 LAB — MAGNESIUM: Magnesium: 2.2 mg/dL (ref 1.7–2.4)

## 2019-11-13 LAB — D-DIMER, QUANTITATIVE: D-Dimer, Quant: 0.5 ug/mL-FEU (ref 0.00–0.50)

## 2019-11-13 LAB — C-REACTIVE PROTEIN: CRP: 2.3 mg/dL — ABNORMAL HIGH (ref <1.0)

## 2019-11-13 NOTE — Progress Notes (Signed)
PROGRESS NOTE    Ethan Schmidt  DXA:[id number] DOB: July 17, 1960 DOA: 11/11/2019 PCP: Patient, No Pcp Per   Brief Narrative:  Per HPI: Ethan Schmidt is a 59 y.o. male with medical history significant for asthma, hypertension, and CKD stage IIIa who was recently discharged on 5/9 after improvement in gastrointestinal complaints related to positive Covid status.  He was not noted to have any signs of pneumonia or respiratory distress at that time and was tolerating a diet.  He had IV fluid hydration and his AKI had resolved as well and he was stable for discharge.  Unfortunately overnight he began to develop worsening shortness of breath which he had not felt before.  He was also having some chills, but denied any fevers.  He denies any rhinorrhea, sore throat, or loss of taste or smell.  He does have some centralized chest pain, but mainly when he coughs.  -Patient admitted with acute hypoxemic respiratory failure secondary to COVID-19 pneumonia and continues to remain on 4 L nasal cannula oxygen.  Assessment & Plan:   Active Problems:   Pneumonia due to 2019 novel coronavirus   Acute hypoxemic respiratory failure secondary to COVID-19 pneumonia -IV remdesivir and dexamethasone as ordered, he is slowly showing signs of improvement but remains very SOB and coughing, still requires oxygen.  -Follow inflammatory markers -Isolation precautions -Vitamin C and zinc -Robitussin for cough and breathing treatments ordered as needed -Wean oxygen as tolerated to room air  AKI on CKD stage IIIa -Continue IV fluid as this appears all prerenal -Monitor repeat labs -Avoid nephrotoxic agents  Gastrointestinal distress with diarrhea secondary to Covid -Continue Imodium as needed -Zofran as needed for any potential nausea or vomiting -Attempt heart healthy diet -Noted to be THC positive, and will need counseling on cessation as this may be a possible cause of his GI symptoms  History of  hypertension -Currently with soft blood pressure readings -Plan to hold amlodipine, metoprolol, lisinopril, and HCTZ for now and monitor  History of asthma -Albuterol treatments every 6 hours as needed -No active wheezing currently noted  DVT prophylaxis: Lovenox Code Status: Full Family Communication: telephone update  Disposition Plan:  Status is: Inpatient  Remains inpatient appropriate because:Ongoing diagnostic testing needed not appropriate for outpatient work up and IV treatments appropriate due to intensity of illness    Dispo: The patient is from: Home  Anticipated d/c is to: Home  Anticipated d/c date is: 1 days if he continues to improve  Patient currently is not medically stable to d/c.  Patient will require inpatient Covid pneumonia treatment with IV remdesivir and steroids as well as breathing treatments.  He bounced back quickly and we are monitoring to be sure he will be stable at home.  He may need to discharge home with oxygen.    Consultants:   None  Procedures:   None  Antimicrobials:  Anti-infectives (From admission, onward)   Start     Dose/Rate Route Frequency Ordered Stop   11/12/19 1000  remdesivir 100 mg in sodium chloride 0.9 % 100 mL IVPB  Status:  Discontinued     100 mg 200 mL/hr over 30 Minutes Intravenous Daily 11/11/19 0756 11/11/19 0807   11/12/19 1000  remdesivir 100 mg in sodium chloride 0.9 % 100 mL IVPB     100 mg 200 mL/hr over 30 Minutes Intravenous Daily 11/11/19 0807 11/16/19 0959   11/11/19 0900  remdesivir 100 mg in sodium chloride 0.9 % 100 mL IVPB     100  mg 200 mL/hr over 30 Minutes Intravenous Every 1 hr x 2 11/11/19 0807 11/11/19 1030   11/11/19 0800  remdesivir 200 mg in sodium chloride 0.9% 250 mL IVPB  Status:  Discontinued     200 mg 580 mL/hr over 30 Minutes Intravenous Once 11/11/19 0756 11/11/19 0807      Subjective: Patient reports he continues to have SOB.  He denies CP.   He has dry hacking cough but denies fever and chills.   Objective: Vitals:   11/12/19 0928 11/12/19 1500 11/12/19 2114 11/13/19 0556  BP:  (!) 138/95 (!) 131/95 140/85  Pulse:  70 75 (!) 59  Resp:  20 18 18   Temp:  97.9 F (36.6 C) 98.3 F (36.8 C) 98.3 F (36.8 C)  TempSrc:  Oral Oral Oral  SpO2: 96% 98% 97% 97%  Weight:      Height:        Intake/Output Summary (Last 24 hours) at 11/13/2019 1418 Last data filed at 11/13/2019 0900 Gross per 24 hour  Intake 830 ml  Output --  Net 830 ml   Filed Weights   11/11/19 0227  Weight: 104.3 kg    Examination:  General exam: Appears calm and comfortable  Respiratory system: Clear to auscultation. Respiratory effort normal.  Currently on 3 L nasal cannula oxygen. Cardiovascular system: S1 & S2 heard, RRR. No JVD, murmurs, rubs, gallops or clicks. No pedal edema. Gastrointestinal system: Abdomen is nondistended, soft and nontender. No organomegaly or masses felt. Normal bowel sounds heard. Central nervous system: Alert and oriented. No focal neurological deficits. Extremities: Symmetric 5 x 5 power. Skin: No rashes, lesions or ulcers Psychiatry: Judgement and insight appear normal. Mood & affect appropriate.   Data Reviewed: I have personally reviewed following labs and imaging studies  CBC: Recent Labs  Lab 11/09/19 0733 11/10/19 0710 11/11/19 0430 11/12/19 0536 11/13/19 0533  WBC 6.3 6.9 7.4 5.9 16.4*  NEUTROABS  --   --  5.7 4.5 14.3*  HGB 12.6* 11.9* 13.0 11.4* 11.4*  HCT 39.2 37.6* 40.2 35.6* 35.4*  MCV 96.1 96.9 95.5 96.5 96.7  PLT 273 267 297 333 277*   Basic Metabolic Panel: Recent Labs  Lab 11/09/19 0733 11/10/19 0710 11/11/19 0430 11/12/19 0536 11/13/19 0533  NA 135 136 135 138 140  K 3.4* 3.4* 3.6 4.5 4.0  CL 99 102 99 102 103  CO2 27 25 22 26 28   GLUCOSE 96 103* 100* 141* 137*  BUN 17 16 22* 27* 31*  CREATININE 1.95* 1.94* 2.19* 1.65* 1.80*  CALCIUM 8.0* 7.9* 8.8* 8.6* 8.8*  MG 1.8 1.8  --  2.1  2.2   GFR: Estimated Creatinine Clearance: 59 mL/min (A) (by C-G formula based on SCr of 1.8 mg/dL (H)). Liver Function Tests: Recent Labs  Lab 11/08/19 1048 11/09/19 0733 11/11/19 0430 11/12/19 0536 11/13/19 0533  AST 54* 64* 86* 66* 60*  ALT 26 28 39 39 48*  ALKPHOS 72 62 68 57 59  BILITOT 0.8 0.8 1.1 0.6 0.6  PROT 8.2* 7.1 8.3* 6.5 6.6  ALBUMIN 3.9 3.5 4.0 2.8* 3.0*   No results for input(s): LIPASE, AMYLASE in the last 168 hours. No results for input(s): AMMONIA in the last 168 hours. Coagulation Profile: No results for input(s): INR, PROTIME in the last 168 hours. Cardiac Enzymes: No results for input(s): CKTOTAL, CKMB, CKMBINDEX, TROPONINI in the last 168 hours. BNP (last 3 results) No results for input(s): PROBNP in the last 8760 hours. HbA1C: No results for  input(s): HGBA1C in the last 72 hours. CBG: No results for input(s): GLUCAP in the last 168 hours. Lipid Profile: Recent Labs    11/11/19 0430  TRIG 177*   Thyroid Function Tests: No results for input(s): TSH, T4TOTAL, FREET4, T3FREE, THYROIDAB in the last 72 hours. Anemia Panel: Recent Labs    11/12/19 0536 11/13/19 0533  FERRITIN 2,256* 1,336*   Sepsis Labs: Recent Labs  Lab 11/11/19 0430 11/11/19 0829  PROCALCITON 0.15  --   LATICACIDVEN 1.1 0.8    Recent Results (from the past 240 hour(s))  Respiratory Panel by RT PCR (Flu A&B, Covid) - Nasopharyngeal Swab     Status: Abnormal   Collection Time: 11/08/19 12:02 PM   Specimen: Nasopharyngeal Swab  Result Value Ref Range Status   SARS Coronavirus 2 by RT PCR POSITIVE (A) NEGATIVE Final    Comment: CRITICAL RESULT CALLED TO, READ BACK BY AND VERIFIED WITH: SHORE L. AT 5916 ON [id number] BY THOMPSON S. (NOTE) SARS-CoV-2 target nucleic acids are DETECTED. SARS-CoV-2 RNA is generally detectable in upper respiratory specimens  during the acute phase of infection. Positive results are indicative of the presence of the identified virus, but do not  rule out bacterial infection or co-infection with other pathogens not detected by the test. Clinical correlation with patient history and other diagnostic information is necessary to determine patient infection status. The expected result is Negative. Fact Sheet for Patients:  PinkCheek.be Fact Sheet for Healthcare Providers: GravelBags.it This test is not yet approved or cleared by the [place] FDA and  has been authorized for detection and/or diagnosis of SARS-CoV-2 by FDA under an Emergency Use Authorization (EUA).  This EUA will remain in effect (meaning this test  can be used) for the duration of  the COVID-19 declaration under Section 564(b)(1) of the Act, 21 U.S.C. section 360bbb-3(b)(1), unless the authorization is terminated or revoked sooner.    Influenza A by PCR NEGATIVE NEGATIVE Final   Influenza B by PCR NEGATIVE NEGATIVE Final    Comment: (NOTE) The Xpert Xpress SARS-CoV-2/FLU/RSV assay is intended as an aid in  the diagnosis of influenza from Nasopharyngeal swab specimens and  should not be used as a sole basis for treatment. Nasal washings and  aspirates are unacceptable for Xpert Xpress SARS-CoV-2/FLU/RSV  testing. Fact Sheet for Patients: PinkCheek.be Fact Sheet for Healthcare Providers: GravelBags.it This test is not yet approved or cleared by the [place] FDA and  has been authorized for detection and/or diagnosis of SARS-CoV-2 by  FDA under an Emergency Use Authorization (EUA). This EUA will remain  in effect (meaning this test can be used) for the duration of the  Covid-19 declaration under Section 564(b)(1) of the Act, 21  U.S.C. section 360bbb-3(b)(1), unless the authorization is  terminated or revoked. Performed at [facility], [address] [place], [place] [address]   Urine culture     Status: None   Collection Time: 11/11/19   3:53 AM   Specimen: Urine, Clean Catch  Result Value Ref Range Status   Specimen Description   Final    URINE, CLEAN CATCH Performed at [facility], [address], [place], [place] [address]    Special Requests   Final    NONE Performed at [facility], [address] [place], [place] [address]    Culture   Final    NO GROWTH Performed at [facility], [place] [address] [place], [place] [address]    Report Status 11/12/2019 FINAL  Final  Blood Culture (routine x 2)     Status: None (Preliminary result)   Collection Time: 11/11/19  4:15 AM   Specimen: BLOOD  Result Value Ref Range Status   Specimen Description BLOOD LEFT ANTECUBITAL  Final   Special Requests   Final    BOTTLES DRAWN AEROBIC AND ANAEROBIC Blood Culture adequate volume   Culture   Final    NO GROWTH 2 DAYS Performed at [facility], [address], [place], [place] [address]    Report Status PENDING  Incomplete  Blood Culture (routine x 2)     Status: None (Preliminary result)   Collection Time: 11/11/19  4:30 AM   Specimen: BLOOD  Result Value Ref Range Status   Specimen Description BLOOD RIGHT ANTECUBITAL  Final   Special Requests   Final    BOTTLES DRAWN AEROBIC AND ANAEROBIC Blood Culture adequate volume   Culture   Final    NO GROWTH 2 DAYS Performed at [facility], [address], [place], [place] [address]    Report Status PENDING  Incomplete     Radiology Studies: No results found.   Scheduled Meds: . vitamin C  500 mg Oral Daily  . enoxaparin (LOVENOX) injection  50 mg Subcutaneous Q24H  . methylPREDNISolone (SOLU-MEDROL) injection  0.5 mg/kg Intravenous Q12H  . sodium chloride flush  3 mL Intravenous Q12H  . zinc sulfate  220 mg Oral Daily   Continuous Infusions: . sodium chloride    . remdesivir 100 mg in NS 100 mL Stopped (11/13/19 1046)     LOS: 2 days   Time spent: 25 minutes  Tymeshia Awan Wynetta Emery, MD [facility]  If 7PM-7AM, please contact  night-coverage [email or web address] 11/13/2019, 2:18 PM

## 2019-11-13 NOTE — Plan of Care (Signed)
  Problem: Education: Goal: Knowledge of General Education information will improve Description: Including pain rating scale, medication(s)/side effects and non-pharmacologic comfort measures Outcome: Progressing   Problem: Clinical Measurements: Goal: Will remain free from infection Outcome: Progressing   Problem: Clinical Measurements: Goal: Respiratory complications will improve Outcome: Progressing   Problem: Clinical Measurements: Goal: Cardiovascular complication will be avoided Outcome: Progressing   Problem: Activity: Goal: Risk for activity intolerance will decrease Outcome: Progressing   Problem: Nutrition: Goal: Adequate nutrition will be maintained Outcome: Progressing   Problem: Coping: Goal: Level of anxiety will decrease Outcome: Progressing   Problem: Education: Goal: Knowledge of risk factors and measures for prevention of condition will improve Outcome: Progressing   Problem: Respiratory: Goal: Will maintain a patent airway Outcome: Progressing   Problem: Respiratory: Goal: Complications related to the disease process, condition or treatment will be avoided or minimized Outcome: Progressing

## 2019-11-14 LAB — CBC WITH DIFFERENTIAL/PLATELET
Abs Immature Granulocytes: 0.18 10*3/uL — ABNORMAL HIGH (ref 0.00–0.07)
Basophils Absolute: 0 10*3/uL (ref 0.0–0.1)
Basophils Relative: 0 %
Eosinophils Absolute: 0 10*3/uL (ref 0.0–0.5)
Eosinophils Relative: 0 %
HCT: 35.5 % — ABNORMAL LOW (ref 39.0–52.0)
Hemoglobin: 11.6 g/dL — ABNORMAL LOW (ref 13.0–17.0)
Immature Granulocytes: 1 %
Lymphocytes Relative: 6 %
Lymphs Abs: 0.9 10*3/uL (ref 0.7–4.0)
MCH: 31.4 pg (ref 26.0–34.0)
MCHC: 32.7 g/dL (ref 30.0–36.0)
MCV: 96.2 fL (ref 80.0–100.0)
Monocytes Absolute: 0.8 10*3/uL (ref 0.1–1.0)
Monocytes Relative: 5 %
Neutro Abs: 14 10*3/uL — ABNORMAL HIGH (ref 1.7–7.7)
Neutrophils Relative %: 88 %
Platelets: 458 10*3/uL — ABNORMAL HIGH (ref 150–400)
RBC: 3.69 MIL/uL — ABNORMAL LOW (ref 4.22–5.81)
RDW: 12.2 % (ref 11.5–15.5)
WBC: 15.9 10*3/uL — ABNORMAL HIGH (ref 4.0–10.5)
nRBC: 0.1 % (ref 0.0–0.2)

## 2019-11-14 LAB — COMPREHENSIVE METABOLIC PANEL
ALT: 69 U/L — ABNORMAL HIGH (ref 0–44)
AST: 71 U/L — ABNORMAL HIGH (ref 15–41)
Albumin: 2.9 g/dL — ABNORMAL LOW (ref 3.5–5.0)
Alkaline Phosphatase: 60 U/L (ref 38–126)
Anion gap: 9 (ref 5–15)
BUN: 33 mg/dL — ABNORMAL HIGH (ref 6–20)
CO2: 26 mmol/L (ref 22–32)
Calcium: 8.5 mg/dL — ABNORMAL LOW (ref 8.9–10.3)
Chloride: 103 mmol/L (ref 98–111)
Creatinine, Ser: 1.66 mg/dL — ABNORMAL HIGH (ref 0.61–1.24)
GFR calc Af Amer: 53 mL/min — ABNORMAL LOW (ref >60)
GFR calc non Af Amer: 45 mL/min — ABNORMAL LOW (ref >60)
Glucose, Bld: 183 mg/dL — ABNORMAL HIGH (ref 70–99)
Potassium: 3.7 mmol/L (ref 3.5–5.1)
Sodium: 138 mmol/L (ref 135–145)
Total Bilirubin: 0.6 mg/dL (ref 0.3–1.2)
Total Protein: 6.3 g/dL — ABNORMAL LOW (ref 6.5–8.1)

## 2019-11-14 LAB — D-DIMER, QUANTITATIVE: D-Dimer, Quant: 0.45 ug/mL-FEU (ref 0.00–0.50)

## 2019-11-14 LAB — MAGNESIUM: Magnesium: 2.3 mg/dL (ref 1.7–2.4)

## 2019-11-14 LAB — VITAMIN D 25 HYDROXY (VIT D DEFICIENCY, FRACTURES): Vit D, 25-Hydroxy: 21.39 ng/mL — ABNORMAL LOW (ref 30–100)

## 2019-11-14 LAB — FERRITIN: Ferritin: 1250 ng/mL — ABNORMAL HIGH (ref 24–336)

## 2019-11-14 LAB — C-REACTIVE PROTEIN: CRP: 1.3 mg/dL — ABNORMAL HIGH (ref <1.0)

## 2019-11-14 MED ORDER — AMLODIPINE BESYLATE 5 MG PO TABS
5.0000 mg | ORAL_TABLET | Freq: Every day | ORAL | Status: DC
  Administered 2019-11-14 – 2019-11-15 (×2): 5 mg via ORAL
  Filled 2019-11-14 (×2): qty 1

## 2019-11-14 NOTE — Progress Notes (Signed)
SATURATION QUALIFICATIONS: (This note is used to comply with regulatory documentation for home oxygen)  Patient Saturations on Room Air at Rest = 93%  Patient Saturations on Room Air while Ambulating = 96%  Please briefly explain why patient needs home oxygen: pt does not need home O2  Deirdre Pippins, RN

## 2019-11-14 NOTE — Plan of Care (Signed)

## 2019-11-14 NOTE — Progress Notes (Signed)
PROGRESS NOTE    Ethan Schmidt  YNW:[id number] DOB: 04-07-1961 DOA: 11/11/2019 PCP: Patient, No Pcp Per   Brief Narrative:  Per HPI: Ethan Schmidt is a 59 y.o. male with medical history significant for asthma, hypertension, and CKD stage IIIa who was recently discharged on 5/9 after improvement in gastrointestinal complaints related to positive Covid status.  He was not noted to have any signs of pneumonia or respiratory distress at that time and was tolerating a diet.  He had IV fluid hydration and his AKI had resolved as well and he was stable for discharge.  Unfortunately overnight he began to develop worsening shortness of breath which he had not felt before.  He was also having some chills, but denied any fevers.  He denies any rhinorrhea, sore throat, or loss of taste or smell.  He does have some centralized chest pain, but mainly when he coughs.  -Patient admitted with acute hypoxemic respiratory failure secondary to COVID-19 pneumonia and continues to remain on 4 L nasal cannula oxygen.  Assessment & Plan:   Active Problems:   Pneumonia due to 2019 novel coronavirus   Acute hypoxemic respiratory failure secondary to COVID-19 pneumonia -IV remdesivir and dexamethasone as ordered, he is slowly showing signs of improvement but remains very SOB and coughing, still requires oxygen.  -Follow inflammatory markers -Isolation precautions -Vitamin C and zinc -Robitussin for cough and breathing treatments ordered as needed -Wean oxygen as tolerated to room air  AKI on CKD stage IIIa -Continue IV fluid as this appears all prerenal -Monitor repeat labs -Avoid nephrotoxic agents  Gastrointestinal distress with diarrhea secondary to Covid -Continue Imodium as needed -Zofran as needed for any potential nausea or vomiting -Attempt heart healthy diet -Noted to be THC positive, and TOC consulted  History of hypertension -Currently with soft blood pressure readings -Plan to hold  metoprolol, lisinopril, and HCTZ, restarted amlodipine 5 mg  History of asthma -Albuterol treatments every 6 hours as needed -No active wheezing  DVT prophylaxis: Lovenox Code Status: Full Family Communication: telephone updated  Disposition Plan:  Status is: Inpatient  Remains inpatient appropriate because:Ongoing diagnostic testing needed not appropriate for outpatient work up and IV treatments appropriate due to intensity of illness    Dispo: The patient is from: Home  Anticipated d/c is to: Home  Anticipated d/c date is: tomorrow if we can get a discharge plan in place, [facility] aware  Patient currently is not medically stable to d/c.  Complete IV remdesivir (last dose tomorrow), Work on a discharge plan, now reporting has no where to go and no where to quarantine after discharge. TOC consulted.   Consultants:   None  Procedures:   None  Antimicrobials:  Anti-infectives (From admission, onward)   Start     Dose/Rate Route Frequency Ordered Stop   11/12/19 1000  remdesivir 100 mg in sodium chloride 0.9 % 100 mL IVPB  Status:  Discontinued     100 mg 200 mL/hr over 30 Minutes Intravenous Daily 11/11/19 0756 11/11/19 0807   11/12/19 1000  remdesivir 100 mg in sodium chloride 0.9 % 100 mL IVPB     100 mg 200 mL/hr over 30 Minutes Intravenous Daily 11/11/19 0807 11/16/19 0959   11/11/19 0900  remdesivir 100 mg in sodium chloride 0.9 % 100 mL IVPB     100 mg 200 mL/hr over 30 Minutes Intravenous Every 1 hr x 2 11/11/19 0807 11/11/19 1030   11/11/19 0800  remdesivir 200 mg in sodium chloride 0.9% 250 mL  IVPB  Status:  Discontinued     200 mg 580 mL/hr over 30 Minutes Intravenous Once 11/11/19 0756 11/11/19 4098      Subjective: Patient says he is not quite back to his baseline and remains on 2-3L of oxygen.  SOB with ambulation. No CP. No fever, cough about the same as yesterday, nonproductive.  Says he has no place to go and no where to  quarantine after discharge.    Objective: Vitals:   11/12/19 2114 11/13/19 0556 11/13/19 2124 11/14/19 0522  BP: (!) 131/95 140/85 128/83 (!) 159/99  Pulse: 75 (!) 59 65 (!) 53  Resp: 18 18 16 16   Temp: 98.3 F (36.8 C) 98.3 F (36.8 C) 97.6 F (36.4 C) 98.2 F (36.8 C)  TempSrc: Oral Oral Oral Oral  SpO2: 97% 97% 98% 96%  Weight:      Height:        Intake/Output Summary (Last 24 hours) at 11/14/2019 1234 Last data filed at 11/14/2019 0525 Gross per 24 hour  Intake 920 ml  Output --  Net 920 ml   Filed Weights   11/11/19 0227  Weight: 104.3 kg    Examination:  General exam: Appears calm and comfortable  Respiratory system: Clear to auscultation. Respiratory effort normal.  Currently on 2 L nasal cannula oxygen. Cardiovascular system: S1 & S2 heard, RRR. No JVD, murmurs, rubs, gallops or clicks. No pedal edema. Gastrointestinal system: Abdomen is nondistended, soft and nontender. No organomegaly or masses felt. Normal bowel sounds heard. Central nervous system: Alert and oriented. No focal neurological deficits. Extremities: Symmetric 5 x 5 power. Skin: No rashes, lesions or ulcers Psychiatry: Judgement and insight appear normal. Mood & affect appropriate.   Data Reviewed: I have personally reviewed following labs and imaging studies  CBC: Recent Labs  Lab 11/10/19 0710 11/11/19 0430 11/12/19 0536 11/13/19 0533 11/14/19 0539  WBC 6.9 7.4 5.9 16.4* 15.9*  NEUTROABS  --  5.7 4.5 14.3* 14.0*  HGB 11.9* 13.0 11.4* 11.4* 11.6*  HCT 37.6* 40.2 35.6* 35.4* 35.5*  MCV 96.9 95.5 96.5 96.7 96.2  PLT 267 297 333 412* 119*   Basic Metabolic Panel: Recent Labs  Lab 11/09/19 0733 11/09/19 0733 11/10/19 0710 11/11/19 0430 11/12/19 0536 11/13/19 0533 11/14/19 0539  NA 135   < > 136 135 138 140 138  K 3.4*   < > 3.4* 3.6 4.5 4.0 3.7  CL 99   < > 102 99 102 103 103  CO2 27   < > 25 22 26 28 26   GLUCOSE 96   < > 103* 100* 141* 137* 183*  BUN 17   < > 16 22* 27*  31* 33*  CREATININE 1.95*   < > 1.94* 2.19* 1.65* 1.80* 1.66*  CALCIUM 8.0*   < > 7.9* 8.8* 8.6* 8.8* 8.5*  MG 1.8  --  1.8  --  2.1 2.2 2.3   < > = values in this interval not displayed.   GFR: Estimated Creatinine Clearance: 64 mL/min (A) (by C-G formula based on SCr of 1.66 mg/dL (H)). Liver Function Tests: Recent Labs  Lab 11/09/19 0733 11/11/19 0430 11/12/19 0536 11/13/19 0533 11/14/19 0539  AST 64* 86* 66* 60* 71*  ALT 28 39 39 48* 69*  ALKPHOS 62 68 57 59 60  BILITOT 0.8 1.1 0.6 0.6 0.6  PROT 7.1 8.3* 6.5 6.6 6.3*  ALBUMIN 3.5 4.0 2.8* 3.0* 2.9*   No results for input(s): LIPASE, AMYLASE in the last 168 hours.  No results for input(s): AMMONIA in the last 168 hours. Coagulation Profile: No results for input(s): INR, PROTIME in the last 168 hours. Cardiac Enzymes: No results for input(s): CKTOTAL, CKMB, CKMBINDEX, TROPONINI in the last 168 hours. BNP (last 3 results) No results for input(s): PROBNP in the last 8760 hours. HbA1C: No results for input(s): HGBA1C in the last 72 hours. CBG: No results for input(s): GLUCAP in the last 168 hours. Lipid Profile: No results for input(s): CHOL, HDL, LDLCALC, TRIG, CHOLHDL, LDLDIRECT in the last 72 hours. Thyroid Function Tests: No results for input(s): TSH, T4TOTAL, FREET4, T3FREE, THYROIDAB in the last 72 hours. Anemia Panel: Recent Labs    11/13/19 0533 11/14/19 0539  FERRITIN 1,336* 1,250*   Sepsis Labs: Recent Labs  Lab 11/11/19 0430 11/11/19 0829  PROCALCITON 0.15  --   LATICACIDVEN 1.1 0.8    Recent Results (from the past 240 hour(s))  Respiratory Panel by RT PCR (Flu A&B, Covid) - Nasopharyngeal Swab     Status: Abnormal   Collection Time: 11/08/19 12:02 PM   Specimen: Nasopharyngeal Swab  Result Value Ref Range Status   SARS Coronavirus 2 by RT PCR POSITIVE (A) NEGATIVE Final    Comment: CRITICAL RESULT CALLED TO, READ BACK BY AND VERIFIED WITH: SHORE L. AT [id number] ON [id number] BY THOMPSON  S. (NOTE) SARS-CoV-2 target nucleic acids are DETECTED. SARS-CoV-2 RNA is generally detectable in upper respiratory specimens  during the acute phase of infection. Positive results are indicative of the presence of the identified virus, but do not rule out bacterial infection or co-infection with other pathogens not detected by the test. Clinical correlation with patient history and other diagnostic information is necessary to determine patient infection status. The expected result is Negative. Fact Sheet for Patients:  PinkCheek.be Fact Sheet for Healthcare Providers: GravelBags.it This test is not yet approved or cleared by the [place] FDA and  has been authorized for detection and/or diagnosis of SARS-CoV-2 by FDA under an Emergency Use Authorization (EUA).  This EUA will remain in effect (meaning this test  can be used) for the duration of  the COVID-19 declaration under Section 564(b)(1) of the Act, 21 U.S.C. section 360bbb-3(b)(1), unless the authorization is terminated or revoked sooner.    Influenza A by PCR NEGATIVE NEGATIVE Final   Influenza B by PCR NEGATIVE NEGATIVE Final    Comment: (NOTE) The Xpert Xpress SARS-CoV-2/FLU/RSV assay is intended as an aid in  the diagnosis of influenza from Nasopharyngeal swab specimens and  should not be used as a sole basis for treatment. Nasal washings and  aspirates are unacceptable for Xpert Xpress SARS-CoV-2/FLU/RSV  testing. Fact Sheet for Patients: PinkCheek.be Fact Sheet for Healthcare Providers: GravelBags.it This test is not yet approved or cleared by the [place] FDA and  has been authorized for detection and/or diagnosis of SARS-CoV-2 by  FDA under an Emergency Use Authorization (EUA). This EUA will remain  in effect (meaning this test can be used) for the duration of the  Covid-19 declaration under  Section 564(b)(1) of the Act, 21  U.S.C. section 360bbb-3(b)(1), unless the authorization is  terminated or revoked. Performed at [facility], [address] [place], [place] [address]   Urine culture     Status: None   Collection Time: 11/11/19  3:53 AM   Specimen: Urine, Clean Catch  Result Value Ref Range Status   Specimen Description   Final    URINE, CLEAN CATCH Performed at [facility], [address], [place],  [place] [address]    Special Requests   Final    NONE Performed at [facility], [address] [place], [place] [address]    Culture   Final    NO GROWTH Performed at [facility], [place] [address] [place], [place] [address]    Report Status 11/12/2019 FINAL  Final  Blood Culture (routine x 2)     Status: None (Preliminary result)   Collection Time: 11/11/19  4:15 AM   Specimen: BLOOD  Result Value Ref Range Status   Specimen Description BLOOD LEFT ANTECUBITAL  Final   Special Requests   Final    BOTTLES DRAWN AEROBIC AND ANAEROBIC Blood Culture adequate volume   Culture   Final    NO GROWTH 2 DAYS Performed at [facility], [address], [place], [place] [address]    Report Status PENDING  Incomplete  Blood Culture (routine x 2)     Status: None (Preliminary result)   Collection Time: 11/11/19  4:30 AM   Specimen: BLOOD  Result Value Ref Range Status   Specimen Description BLOOD RIGHT ANTECUBITAL  Final   Special Requests   Final    BOTTLES DRAWN AEROBIC AND ANAEROBIC Blood Culture adequate volume   Culture   Final    NO GROWTH 2 DAYS Performed at [facility], [address] [place], [place] [address]    Report Status PENDING  Incomplete     Radiology Studies: No results found.   Scheduled Meds: . vitamin C  500 mg Oral Daily  . enoxaparin (LOVENOX) injection  50 mg Subcutaneous Q24H  . methylPREDNISolone (SOLU-MEDROL) injection  0.5 mg/kg Intravenous Q12H  . sodium chloride flush  3 mL Intravenous Q12H  . zinc sulfate  220 mg Oral Daily    Continuous Infusions: . sodium chloride    . remdesivir 100 mg in NS 100 mL 100 mg (11/14/19 1048)     LOS: 3 days   Time spent: 23 minutes  Shraga Custard Wynetta Emery, MD [facility]  If 7PM-7AM, please contact night-coverage [email or web address] 11/14/2019, 12:34 PM

## 2019-11-14 NOTE — Clinical Social Work Note (Signed)
Patient advises that he does not want to return to the residence where he resided PTA because the other residents in the home do not have [place].  Call made to [facility] Department and message left regarding assistance for patient with isolation housing during isolation period. Messaged advised that calls would be returned within 24 hours.     Tavionna Grout, Clydene Pugh, LCSW

## 2019-11-15 DIAGNOSIS — E559 Vitamin D deficiency, unspecified: Secondary | ICD-10-CM | POA: Diagnosis present

## 2019-11-15 DIAGNOSIS — N179 Acute kidney failure, unspecified: Secondary | ICD-10-CM

## 2019-11-15 LAB — CBC WITH DIFFERENTIAL/PLATELET
Abs Immature Granulocytes: 0.47 10*3/uL — ABNORMAL HIGH (ref 0.00–0.07)
Basophils Absolute: 0 10*3/uL (ref 0.0–0.1)
Basophils Relative: 0 %
Eosinophils Absolute: 0 10*3/uL (ref 0.0–0.5)
Eosinophils Relative: 0 %
HCT: 36.1 % — ABNORMAL LOW (ref 39.0–52.0)
Hemoglobin: 11.5 g/dL — ABNORMAL LOW (ref 13.0–17.0)
Immature Granulocytes: 3 %
Lymphocytes Relative: 7 %
Lymphs Abs: 1.3 10*3/uL (ref 0.7–4.0)
MCH: 30.6 pg (ref 26.0–34.0)
MCHC: 31.9 g/dL (ref 30.0–36.0)
MCV: 96 fL (ref 80.0–100.0)
Monocytes Absolute: 1 10*3/uL (ref 0.1–1.0)
Monocytes Relative: 6 %
Neutro Abs: 14.8 10*3/uL — ABNORMAL HIGH (ref 1.7–7.7)
Neutrophils Relative %: 84 %
Platelets: 515 10*3/uL — ABNORMAL HIGH (ref 150–400)
RBC: 3.76 MIL/uL — ABNORMAL LOW (ref 4.22–5.81)
RDW: 12.1 % (ref 11.5–15.5)
WBC: 17.2 10*3/uL — ABNORMAL HIGH (ref 4.0–10.5)
nRBC: 0.3 % — ABNORMAL HIGH (ref 0.0–0.2)

## 2019-11-15 LAB — COMPREHENSIVE METABOLIC PANEL
ALT: 77 U/L — ABNORMAL HIGH (ref 0–44)
AST: 56 U/L — ABNORMAL HIGH (ref 15–41)
Albumin: 3 g/dL — ABNORMAL LOW (ref 3.5–5.0)
Alkaline Phosphatase: 65 U/L (ref 38–126)
Anion gap: 10 (ref 5–15)
BUN: 33 mg/dL — ABNORMAL HIGH (ref 6–20)
CO2: 28 mmol/L (ref 22–32)
Calcium: 8.7 mg/dL — ABNORMAL LOW (ref 8.9–10.3)
Chloride: 101 mmol/L (ref 98–111)
Creatinine, Ser: 1.6 mg/dL — ABNORMAL HIGH (ref 0.61–1.24)
GFR calc Af Amer: 55 mL/min — ABNORMAL LOW (ref >60)
GFR calc non Af Amer: 47 mL/min — ABNORMAL LOW (ref >60)
Glucose, Bld: 221 mg/dL — ABNORMAL HIGH (ref 70–99)
Potassium: 4.1 mmol/L (ref 3.5–5.1)
Sodium: 139 mmol/L (ref 135–145)
Total Bilirubin: 0.8 mg/dL (ref 0.3–1.2)
Total Protein: 6.5 g/dL (ref 6.5–8.1)

## 2019-11-15 LAB — MAGNESIUM: Magnesium: 2.4 mg/dL (ref 1.7–2.4)

## 2019-11-15 LAB — D-DIMER, QUANTITATIVE: D-Dimer, Quant: 0.41 ug/mL-FEU (ref 0.00–0.50)

## 2019-11-15 LAB — C-REACTIVE PROTEIN: CRP: 0.7 mg/dL (ref <1.0)

## 2019-11-15 LAB — FERRITIN: Ferritin: 1059 ng/mL — ABNORMAL HIGH (ref 24–336)

## 2019-11-15 MED ORDER — ZINC SULFATE 220 (50 ZN) MG PO CAPS: 220.0000 mg | ORAL_CAPSULE | Freq: Every day | ORAL | 0 refills | Status: AC

## 2019-11-15 MED ORDER — PANTOPRAZOLE SODIUM 40 MG PO TBEC
40.0000 mg | DELAYED_RELEASE_TABLET | Freq: Every day | ORAL | 11 refills | Status: AC
Start: 2019-11-15 — End: 2020-11-14

## 2019-11-15 MED ORDER — ASCORBIC ACID 500 MG PO TABS: 500.0000 mg | ORAL_TABLET | Freq: Every day | ORAL | Status: AC

## 2019-11-15 MED ORDER — DEXAMETHASONE 4 MG PO TABS
4.0000 mg | ORAL_TABLET | Freq: Every day | ORAL | 0 refills | Status: AC
Start: 2019-11-16 — End: 2019-11-20

## 2019-11-15 MED ORDER — ALBUTEROL SULFATE HFA 108 (90 BASE) MCG/ACT IN AERS: 1.0000 | INHALATION_SPRAY | RESPIRATORY_TRACT | 2 refills | Status: AC | PRN

## 2019-11-15 MED ORDER — LISINOPRIL 20 MG PO TABS
10.0000 mg | ORAL_TABLET | Freq: Every day | ORAL | 3 refills | Status: DC
Start: 2019-11-15 — End: 2019-12-31

## 2019-11-15 MED ORDER — GUAIFENESIN-DM 100-10 MG/5ML PO SYRP: 10.0000 mL | ORAL_SOLUTION | ORAL | 0 refills | Status: DC | PRN

## 2019-11-15 MED ORDER — VITAMIN D 125 MCG (5000 UT) PO CAPS: 1.0000 | ORAL_CAPSULE | Freq: Every day | ORAL | 2 refills | Status: AC

## 2019-11-15 NOTE — Discharge Summary (Addendum)
Physician Discharge Summary  Ethan Schmidt OTL:[id number] DOB: 14-Oct-1960 DOA: 11/11/2019  PCP: Patient, No Pcp Per  Admit date: 11/11/2019 Discharge date: 11/15/2019  Admitted From:  Home  Disposition:  Home   Recommendations for Outpatient Follow-up:  1. Follow up with PCP in 2 weeks 2. Quarantine at home for at least 2 weeks  Discharge Condition: STABLE   CODE STATUS: FULL    Brief Hospitalization Summary: Please see all hospital notes, images, labs for full details of the hospitalization. ADMISSION HPI: Ethan Schmidt is a 59 y.o. male with medical history significant for asthma, hypertension, and CKD stage IIIa who was recently discharged on 5/9 after improvement in gastrointestinal complaints related to positive Covid status.  He was not noted to have any signs of pneumonia or respiratory distress at that time and was tolerating a diet.  He had IV fluid hydration and his AKI had resolved as well and he was stable for discharge.  Unfortunately overnight he began to develop worsening shortness of breath which he had not felt before.  He was also having some chills, but denied any fevers.  He denies any rhinorrhea, sore throat, or loss of taste or smell.  He does have some centralized chest pain, but mainly when he coughs.   ED Course: Stable vital signs noted and patient is afebrile.  His pulse oximetry was 88% on room air on arrival to the ED.  His labs indicate a recurrence of AKI with creatinine of 2.19 and procalcitonin is 0.15.  EKG with sinus rhythm and chest x-ray now demonstrates multifocal pneumonia.  Urine analysis positive for THC and opiates.  He is now requiring 4 L nasal cannula oxygen.  He denies any current chest pain or hemoptysis.  He has only received some IV fluid and Tylenol thus far.  Brief Narrative:  Per HPI: Ethan Schmidt a 59 y.o.malewith medical history significant forasthma, hypertension, and CKD stage IIIa who was recently discharged on 5/9 after improvement  in gastrointestinal complaints related to positive Covid status. He was not noted to have any signs of pneumonia or respiratory distress at that time and was tolerating a diet. He had IV fluid hydration and his AKI had resolved as well and he was stable for discharge. Unfortunately overnight he began to develop worsening shortness of breath which he had not felt before. He was also having some chills, but denied any fevers. He denies any rhinorrhea, sore throat, or loss of taste or smell. He does have some centralized chest pain, but mainly when he coughs.  -Patient admitted with acute hypoxemic respiratory failure secondary to COVID-19 pneumonia and continues to remain on 4 L nasal cannula oxygen.  Assessment & Plan:   Active Problems:   Pneumonia due to 2019 novel coronavirus  Acute hypoxemic respiratory failure secondary to COVID-19 pneumonia -IV remdesivir and dexamethasone as ordered, he is improving -Inflammatory markers markedly improved -Isolation precautions -Vitamin C, D and zinc -Robitussin for cough and breathing treatments ordered as needed -He remains on room air and does not need  Home oxygen  AKI on CKD stage IIIa -Continue IV fluid as this appears all prerenal -Monitor repeat labs -Avoid nephrotoxic agents  Gastrointestinal distress with diarrhea secondary to Covid -Continue Imodium as needed -Zofran as needed for any potential nausea or vomiting -Attempt heart healthy diet -Noted to be THC positive, and TOC consulted  Essential hypertension  -resume home metoprolol, lisinopril, and amlodipine   History of asthma -Albuterol treatments every 6 hours as needed -No active  wheezing  Vitamin D Insufficiency Vitamin D 5000 IU caps once daily recommended  DVT prophylaxis: Lovenox Code Status: Full Family Communication: telephone updated  Disposition Plan: HOME  Status is: Inpatient  Discharge Diagnoses:  Active Problems:   AKI (acute kidney  injury) ([place])   Pneumonia due to 2019 novel coronavirus   Vitamin D insufficiency  Discharge Instructions: Discharge Instructions    MyChart COVID-19 home monitoring program   Complete by: Nov 15, 2019    Is the patient willing to use the [facility] for home monitoring?: Yes     Allergies as of 11/15/2019      Reactions   Shellfish Allergy Anaphylaxis   Omnipaque [iohexol] Hives, Swelling   Pt. Did fine with 1 hour pre medication.      Medication List    STOP taking these medications   hydrochlorothiazide 25 MG tablet Commonly known as: HYDRODIURIL   potassium chloride 10 MEQ tablet Commonly known as: KLOR-CON     TAKE these medications   albuterol 108 (90 Base) MCG/ACT inhaler Commonly known as: VENTOLIN HFA Inhale 1-2 puffs into the lungs every 4 (four) hours as needed for wheezing or shortness of breath. What changed: when to take this   amLODipine 10 MG tablet Commonly known as: NORVASC Take 1 tablet (10 mg total) by mouth daily.   ascorbic acid 500 MG tablet Commonly known as: VITAMIN C Take 1 tablet (500 mg total) by mouth daily. Start taking on: Nov 16, 2019   aspirin 81 MG tablet Take 81 mg by mouth daily.   dexamethasone 4 MG tablet Commonly known as: DECADRON Take 1 tablet (4 mg total) by mouth daily with breakfast for 4 days. Start taking on: Nov 16, 2019   guaiFENesin-dextromethorphan 100-10 MG/5ML syrup Commonly known as: ROBITUSSIN DM Take 10 mLs by mouth every 4 (four) hours as needed for cough.   lisinopril 20 MG tablet Commonly known as: ZESTRIL Take 0.5 tablets (10 mg total) by mouth daily. What changed: how much to take   loperamide 2 MG capsule Commonly known as: IMODIUM Take 1 capsule (2 mg total) by mouth as needed for diarrhea or loose stools.   metoprolol tartrate 100 MG tablet Commonly known as: LOPRESSOR Take 1 tablet (100 mg total) by mouth 2 (two) times daily.   ondansetron 4 MG tablet Commonly known as:  Zofran Take 1 tablet (4 mg total) by mouth daily as needed for nausea or vomiting.   pantoprazole 40 MG tablet Commonly known as: Protonix Take 1 tablet (40 mg total) by mouth daily.   Vitamin D 125 MCG (5000 UT) Caps Take 1 capsule by mouth daily.   zinc sulfate 220 (50 Zn) MG capsule Take 1 capsule (220 mg total) by mouth daily. Start taking on: Nov 16, 2019      Follow-up Information    PCP. Schedule an appointment as soon as possible for a visit in 2 week(s).          Allergies  Allergen Reactions  . Shellfish Allergy Anaphylaxis  . Omnipaque [Iohexol] Hives and Swelling    Pt. Did fine with 1 hour pre medication.   Allergies as of 11/15/2019      Reactions   Shellfish Allergy Anaphylaxis   Omnipaque [iohexol] Hives, Swelling   Pt. Did fine with 1 hour pre medication.      Medication List    STOP taking these medications   hydrochlorothiazide 25 MG tablet Commonly known as: HYDRODIURIL   potassium chloride  10 MEQ tablet Commonly known as: KLOR-CON     TAKE these medications   albuterol 108 (90 Base) MCG/ACT inhaler Commonly known as: VENTOLIN HFA Inhale 1-2 puffs into the lungs every 4 (four) hours as needed for wheezing or shortness of breath. What changed: when to take this   amLODipine 10 MG tablet Commonly known as: NORVASC Take 1 tablet (10 mg total) by mouth daily.   ascorbic acid 500 MG tablet Commonly known as: VITAMIN C Take 1 tablet (500 mg total) by mouth daily. Start taking on: Nov 16, 2019   aspirin 81 MG tablet Take 81 mg by mouth daily.   dexamethasone 4 MG tablet Commonly known as: DECADRON Take 1 tablet (4 mg total) by mouth daily with breakfast for 4 days. Start taking on: Nov 16, 2019   guaiFENesin-dextromethorphan 100-10 MG/5ML syrup Commonly known as: ROBITUSSIN DM Take 10 mLs by mouth every 4 (four) hours as needed for cough.   lisinopril 20 MG tablet Commonly known as: ZESTRIL Take 0.5 tablets (10 mg total) by mouth  daily. What changed: how much to take   loperamide 2 MG capsule Commonly known as: IMODIUM Take 1 capsule (2 mg total) by mouth as needed for diarrhea or loose stools.   metoprolol tartrate 100 MG tablet Commonly known as: LOPRESSOR Take 1 tablet (100 mg total) by mouth 2 (two) times daily.   ondansetron 4 MG tablet Commonly known as: Zofran Take 1 tablet (4 mg total) by mouth daily as needed for nausea or vomiting.   pantoprazole 40 MG tablet Commonly known as: Protonix Take 1 tablet (40 mg total) by mouth daily.   Vitamin D 125 MCG (5000 UT) Caps Take 1 capsule by mouth daily.   zinc sulfate 220 (50 Zn) MG capsule Take 1 capsule (220 mg total) by mouth daily. Start taking on: Nov 16, 2019       Procedures/Studies: [facility] Chest [place] 1 View  Result Date: 11/11/2019 CLINICAL DATA:  COVID-19 positive, short of breath, cough EXAM: PORTABLE CHEST 1 VIEW COMPARISON:  08/25/2019 FINDINGS: Single frontal view of the chest demonstrates basilar predominant interstitial and ground-glass opacities, increased since prior study. No effusion or pneumothorax. Cardiac silhouette is stable. IMPRESSION: 1. Progressive bibasilar interstitial and ground-glass opacities consistent with multifocal pneumonia, compatible with history of COVID-19. Electronically Signed   By: Randa Ngo M.D.   On: 11/11/2019 02:54   DG ABD ACUTE 2+V W 1V CHEST  Result Date: 11/08/2019 CLINICAL DATA:  Chest pain and shortness of breath.  Diarrhea. EXAM: DG ABDOMEN ACUTE W/ 1V CHEST COMPARISON:  Abdomen series December 17, 2009; chest radiograph August 25, 2019 FINDINGS: PA chest: Lungs are clear. Heart size and pulmonary vascularity are normal. No adenopathy. Supine and upright abdomen: There is moderate stool in the colon. There is no bowel dilatation or air-fluid level to suggest bowel obstruction. No free air. IMPRESSION: No bowel obstruction or free air. Moderate stool in colon. No edema or airspace opacity. Electronically  Signed   By: Lowella Grip III M.D.   On: 11/08/2019 11:55      Subjective: Pt reporting that he is feeling better.  No CP, No SOB.    Discharge Exam: Vitals:   11/14/19 2025 11/15/19 0505  BP: (!) 153/109 (!) 149/105  Pulse: 69 60  Resp: 16 16  Temp: 98.3 F (36.8 C) 98.3 F (36.8 C)  SpO2: 95% 98%   Vitals:   11/14/19 0522 11/14/19 1506 11/14/19 2025 11/15/19 0505  BP: Marland Kitchen)  159/99 (!) 142/89 (!) 153/109 (!) 149/105  Pulse: (!) 53 63 69 60  Resp: 16 16 16 16   Temp: 98.2 F (36.8 C) (!) 97.5 F (36.4 C) 98.3 F (36.8 C) 98.3 F (36.8 C)  TempSrc: Oral  Oral Oral  SpO2: 96% 93% 95% 98%  Weight:      Height:       General: Pt is alert, awake, not in acute distress Cardiovascular: RRR, S1/S2 +, no rubs, no gallops Respiratory: CTA bilaterally, no wheezing, no rhonchi Abdominal: Soft, NT, ND, bowel sounds + Extremities: no edema, no cyanosis   The results of significant diagnostics from this hospitalization (including imaging, microbiology, ancillary and laboratory) are listed below for reference.     Microbiology: Recent Results (from the past 240 hour(s))  Respiratory Panel by RT PCR (Flu A&B, Covid) - Nasopharyngeal Swab     Status: Abnormal   Collection Time: 11/08/19 12:02 PM   Specimen: Nasopharyngeal Swab  Result Value Ref Range Status   SARS Coronavirus 2 by RT PCR POSITIVE (A) NEGATIVE Final    Comment: CRITICAL RESULT CALLED TO, READ BACK BY AND VERIFIED WITH: SHORE L. AT [id number] ON [id number] BY THOMPSON S. (NOTE) SARS-CoV-2 target nucleic acids are DETECTED. SARS-CoV-2 RNA is generally detectable in upper respiratory specimens  during the acute phase of infection. Positive results are indicative of the presence of the identified virus, but do not rule out bacterial infection or co-infection with other pathogens not detected by the test. Clinical correlation with patient history and other diagnostic information is necessary to determine patient infection  status. The expected result is Negative. Fact Sheet for Patients:  PinkCheek.be Fact Sheet for Healthcare Providers: GravelBags.it This test is not yet approved or cleared by the [place] FDA and  has been authorized for detection and/or diagnosis of SARS-CoV-2 by FDA under an Emergency Use Authorization (EUA).  This EUA will remain in effect (meaning this test  can be used) for the duration of  the COVID-19 declaration under Section 564(b)(1) of the Act, 21 U.S.C. section 360bbb-3(b)(1), unless the authorization is terminated or revoked sooner.    Influenza A by PCR NEGATIVE NEGATIVE Final   Influenza B by PCR NEGATIVE NEGATIVE Final    Comment: (NOTE) The Xpert Xpress SARS-CoV-2/FLU/RSV assay is intended as an aid in  the diagnosis of influenza from Nasopharyngeal swab specimens and  should not be used as a sole basis for treatment. Nasal washings and  aspirates are unacceptable for Xpert Xpress SARS-CoV-2/FLU/RSV  testing. Fact Sheet for Patients: PinkCheek.be Fact Sheet for Healthcare Providers: GravelBags.it This test is not yet approved or cleared by the [place] FDA and  has been authorized for detection and/or diagnosis of SARS-CoV-2 by  FDA under an Emergency Use Authorization (EUA). This EUA will remain  in effect (meaning this test can be used) for the duration of the  Covid-19 declaration under Section 564(b)(1) of the Act, 21  U.S.C. section 360bbb-3(b)(1), unless the authorization is  terminated or revoked. Performed at [facility], [address] [place], [place] [address]   Urine culture     Status: None   Collection Time: 11/11/19  3:53 AM   Specimen: Urine, Clean Catch  Result Value Ref Range Status   Specimen Description   Final    URINE, CLEAN CATCH Performed at [facility], [address] [place], [place] [address]    Special  Requests   Final    NONE Performed at [facility], [place]  [address], [place], [place] [address]    Culture   Final    NO GROWTH Performed at [facility], [place] [address] [place], [place] [address]    Report Status 11/12/2019 FINAL  Final  Blood Culture (routine x 2)     Status: None (Preliminary result)   Collection Time: 11/11/19  4:15 AM   Specimen: BLOOD  Result Value Ref Range Status   Specimen Description BLOOD LEFT ANTECUBITAL  Final   Special Requests   Final    BOTTLES DRAWN AEROBIC AND ANAEROBIC Blood Culture adequate volume   Culture   Final    NO GROWTH 4 DAYS Performed at [facility], [address], [place], [place] [address]    Report Status PENDING  Incomplete  Blood Culture (routine x 2)     Status: None (Preliminary result)   Collection Time: 11/11/19  4:30 AM   Specimen: BLOOD  Result Value Ref Range Status   Specimen Description BLOOD RIGHT ANTECUBITAL  Final   Special Requests   Final    BOTTLES DRAWN AEROBIC AND ANAEROBIC Blood Culture adequate volume   Culture   Final    NO GROWTH 4 DAYS Performed at [facility], [address], [place], [place] [address]    Report Status PENDING  Incomplete     Labs: BNP (last 3 results) No results for input(s): BNP in the last 8760 hours. Basic Metabolic Panel: Recent Labs  Lab 11/10/19 0710 11/10/19 0710 11/11/19 0430 11/12/19 0536 11/13/19 0533 11/14/19 0539 11/15/19 0448  NA 136   < > 135 138 140 138 139  K 3.4*   < > 3.6 4.5 4.0 3.7 4.1  CL 102   < > 99 102 103 103 101  CO2 25   < > 22 26 28 26 28   GLUCOSE 103*   < > 100* 141* 137* 183* 221*  BUN 16   < > 22* 27* 31* 33* 33*  CREATININE 1.94*   < > 2.19* 1.65* 1.80* 1.66* 1.60*  CALCIUM 7.9*   < > 8.8* 8.6* 8.8* 8.5* 8.7*  MG 1.8  --   --  2.1 2.2 2.3 2.4   < > = values in this interval not displayed.   Liver Function Tests: Recent Labs  Lab 11/11/19 0430 11/12/19 0536 11/13/19 0533 11/14/19 0539 11/15/19 0448  AST 86* 66* 60* 71* 56*   ALT 39 39 48* 69* 77*  ALKPHOS 68 57 59 60 65  BILITOT 1.1 0.6 0.6 0.6 0.8  PROT 8.3* 6.5 6.6 6.3* 6.5  ALBUMIN 4.0 2.8* 3.0* 2.9* 3.0*   No results for input(s): LIPASE, AMYLASE in the last 168 hours. No results for input(s): AMMONIA in the last 168 hours. CBC: Recent Labs  Lab 11/11/19 0430 11/12/19 0536 11/13/19 0533 11/14/19 0539 11/15/19 0448  WBC 7.4 5.9 16.4* 15.9* 17.2*  NEUTROABS 5.7 4.5 14.3* 14.0* 14.8*  HGB 13.0 11.4* 11.4* 11.6* 11.5*  HCT 40.2 35.6* 35.4* 35.5* 36.1*  MCV 95.5 96.5 96.7 96.2 96.0  PLT 297 333 412* 458* 515*   Cardiac Enzymes: No results for input(s): CKTOTAL, CKMB, CKMBINDEX, TROPONINI in the last 168 hours. BNP: Invalid input(s): POCBNP CBG: No results for input(s): GLUCAP in the last 168 hours. D-Dimer Recent Labs    11/14/19 0539 11/15/19 0448  DDIMER 0.45 0.41   Hgb A1c No results for input(s): HGBA1C in the last 72 hours. Lipid Profile No results for input(s): CHOL, HDL, LDLCALC, TRIG, CHOLHDL, LDLDIRECT in the last 72  hours. Thyroid function studies No results for input(s): TSH, T4TOTAL, T3FREE, THYROIDAB in the last 72 hours.  Invalid input(s): FREET3 Anemia work up Recent Labs    11/14/19 0539 11/15/19 0448  FERRITIN 1,250* 1,059*   Urinalysis    Component Value Date/Time   COLORURINE YELLOW 11/11/2019 [place] 11/11/2019 0353   LABSPEC 1.020 11/11/2019 0353   PHURINE 7.0 11/11/2019 0353   GLUCOSEU NEGATIVE 11/11/2019 0353   HGBUR LARGE (A) 11/11/2019 0353   BILIRUBINUR NEGATIVE 11/11/2019 0353   KETONESUR NEGATIVE 11/11/2019 0353   PROTEINUR 100 (A) 11/11/2019 0353   UROBILINOGEN 0.2 11/23/2011 0130   NITRITE NEGATIVE 11/11/2019 0353   LEUKOCYTESUR NEGATIVE 11/11/2019 0353   Sepsis Labs Invalid input(s): PROCALCITONIN,  WBC,  LACTICIDVEN Microbiology Recent Results (from the past 240 hour(s))  Respiratory Panel by RT PCR (Flu A&B, Covid) - Nasopharyngeal Swab     Status: Abnormal    Collection Time: 11/08/19 12:02 PM   Specimen: Nasopharyngeal Swab  Result Value Ref Range Status   SARS Coronavirus 2 by RT PCR POSITIVE (A) NEGATIVE Final    Comment: CRITICAL RESULT CALLED TO, READ BACK BY AND VERIFIED WITH: SHORE L. AT 6962 ON [id number] BY THOMPSON S. (NOTE) SARS-CoV-2 target nucleic acids are DETECTED. SARS-CoV-2 RNA is generally detectable in upper respiratory specimens  during the acute phase of infection. Positive results are indicative of the presence of the identified virus, but do not rule out bacterial infection or co-infection with other pathogens not detected by the test. Clinical correlation with patient history and other diagnostic information is necessary to determine patient infection status. The expected result is Negative. Fact Sheet for Patients:  PinkCheek.be Fact Sheet for Healthcare Providers: GravelBags.it This test is not yet approved or cleared by the [place] FDA and  has been authorized for detection and/or diagnosis of SARS-CoV-2 by FDA under an Emergency Use Authorization (EUA).  This EUA will remain in effect (meaning this test  can be used) for the duration of  the COVID-19 declaration under Section 564(b)(1) of the Act, 21 U.S.C. section 360bbb-3(b)(1), unless the authorization is terminated or revoked sooner.    Influenza A by PCR NEGATIVE NEGATIVE Final   Influenza B by PCR NEGATIVE NEGATIVE Final    Comment: (NOTE) The Xpert Xpress SARS-CoV-2/FLU/RSV assay is intended as an aid in  the diagnosis of influenza from Nasopharyngeal swab specimens and  should not be used as a sole basis for treatment. Nasal washings and  aspirates are unacceptable for Xpert Xpress SARS-CoV-2/FLU/RSV  testing. Fact Sheet for Patients: PinkCheek.be Fact Sheet for Healthcare Providers: GravelBags.it This test is not yet approved or  cleared by the [place] FDA and  has been authorized for detection and/or diagnosis of SARS-CoV-2 by  FDA under an Emergency Use Authorization (EUA). This EUA will remain  in effect (meaning this test can be used) for the duration of the  Covid-19 declaration under Section 564(b)(1) of the Act, 21  U.S.C. section 360bbb-3(b)(1), unless the authorization is  terminated or revoked. Performed at [facility], [address], [place], [place] [address]   Urine culture     Status: None   Collection Time: 11/11/19  3:53 AM   Specimen: Urine, Clean Catch  Result Value Ref Range Status   Specimen Description   Final    URINE, CLEAN CATCH Performed at [facility], [address], [place], [place] [address]    Special Requests   Final    NONE Performed at  [place] [place], [place] [address]    Culture   Final    NO GROWTH Performed at [facility], [place] [address], [place], [place] [address]    Report Status 11/12/2019 FINAL  Final  Blood Culture (routine x 2)     Status: None (Preliminary result)   Collection Time: 11/11/19  4:15 AM   Specimen: BLOOD  Result Value Ref Range Status   Specimen Description BLOOD LEFT ANTECUBITAL  Final   Special Requests   Final    BOTTLES DRAWN AEROBIC AND ANAEROBIC Blood Culture adequate volume   Culture   Final    NO GROWTH 4 DAYS Performed at [facility], [address], [place], [place] [address]    Report Status PENDING  Incomplete  Blood Culture (routine x 2)     Status: None (Preliminary result)   Collection Time: 11/11/19  4:30 AM   Specimen: BLOOD  Result Value Ref Range Status   Specimen Description BLOOD RIGHT ANTECUBITAL  Final   Special Requests   Final    BOTTLES DRAWN AEROBIC AND ANAEROBIC Blood Culture adequate volume   Culture   Final    NO GROWTH 4 DAYS Performed at [facility], [address], [place], [place] [address]    Report Status PENDING  Incomplete   Time coordinating discharge: 33  Mins  SIGNED:  Irwin Brakeman, MD  [facility] 11/15/2019, 10:44 AM How to contact the [facility] Attending or Consulting provider [place] or covering provider during after hours [place], for this patient?  1. Check the care team in [facility] and look for a) attending/consulting [facility] provider listed and b) the [facility] team listed 2. Log into [email or web address] and use [place] universal password to access. If you do not have the password, please contact the hospital operator. 3. Locate the [facility] provider you are looking for under [facility] and page to a number that you can be directly reached. 4. If you still have difficulty reaching the provider, please page the [facility] (Director on Call) for the Hospitalists listed on amion for assistance.

## 2019-11-15 NOTE — Discharge Instructions (Signed)
10 Things You Can Do to Manage Your COVID-19 Symptoms at Home If you have possible or confirmed COVID-19: 1. Stay home from work and school. And stay away from other public places. If you must go out, avoid using any kind of public transportation, ridesharing, or taxis. 2. Monitor your symptoms carefully. If your symptoms get worse, call your healthcare provider immediately. 3. Get rest and stay hydrated. 4. If you have a medical appointment, call the healthcare provider ahead of time and tell them that you have or may have COVID-19. 5. For medical emergencies, call 911 and notify the dispatch personnel that you have or may have COVID-19. 6. Cover your cough and sneezes with a tissue or use the inside of your elbow. 7. Wash your hands often with soap and water for at least 20 seconds or clean your hands with an alcohol-based hand sanitizer that contains at least 60% alcohol. 8. As much as possible, stay in a specific room and away from other people in your home. Also, you should use a separate bathroom, if available. If you need to be around other people in or outside of the home, wear a mask. 9. Avoid sharing personal items with other people in your household, like dishes, towels, and bedding. 10. Clean all surfaces that are touched often, like counters, tabletops, and doorknobs. Use household cleaning sprays or wipes according to the label instructions. [email or web address] 01/02/2019 This information is not intended to replace advice given to you by your health care provider. Make sure you discuss any questions you have with your health care provider. Document Revised: 06/06/2019 Document Reviewed: 06/06/2019 Elsevier Patient Education  [place].   COVID-19 Frequently Asked Questions COVID-19 (coronavirus disease) is an infection that is caused by a large family of viruses. Some viruses cause illness in people and others cause illness in animals like camels, cats, and bats. In some  cases, the viruses that cause illness in animals can spread to humans. Where did the coronavirus come from? In December 2019, [place] told the [facility]) of several cases of lung disease (human respiratory illness). These cases were linked to an open seafood and livestock market in the city of [place]. The link to the seafood and livestock market suggests that the virus may have spread from animals to humans. However, since that first outbreak in December, the virus has also been shown to spread from person to person. What is the name of the disease and the virus? Disease name Early on, this disease was called novel coronavirus. This is because scientists determined that the disease was caused by a new (novel) respiratory virus. The World Health Organization [facility] has now named the disease COVID-19, or coronavirus disease. Virus name The virus that causes the disease is called severe acute respiratory syndrome coronavirus 2 (SARS-CoV-2). More information on disease and virus naming World Health Organization [facility]): www.who.int/emergencies/diseases/novel-coronavirus-2019/technical-guidance/naming-the-coronavirus-disease-(covid-2019)-and-the-virus-that-causes-it Who is at risk for complications from coronavirus disease? Some people may be at higher risk for complications from coronavirus disease. This includes older adults and people who have chronic diseases, such as heart disease, diabetes, and lung disease. If you are at higher risk for complications, take these extra precautions:  Stay home as much as possible.  Avoid social gatherings and travel.  Avoid close contact with others. Stay at least 6 ft (2 m) away from others, if possible.  Wash your hands often with soap and water for at least 20 seconds.  Avoid touching your face, mouth, nose, or eyes.  Keep supplies on hand at home, such as food, medicine, and cleaning supplies.  If you must go out in public, wear a cloth  face covering or face mask. Make sure your mask covers your nose and mouth. How does coronavirus disease spread? The virus that causes coronavirus disease spreads easily from person to person (is contagious). You may catch the virus by:  Breathing in droplets from an infected person. Droplets can be spread by a person breathing, speaking, singing, coughing, or sneezing.  Touching something, like a table or a doorknob, that was exposed to the virus (contaminated) and then touching your mouth, nose, or eyes. Can I get the virus from touching surfaces or objects? There is still a lot that we do not know about the virus that causes coronavirus disease. Scientists are basing a lot of information on what they know about similar viruses, such as:  Viruses cannot generally survive on surfaces for long. They need a human body (host) to survive.  It is more likely that the virus is spread by close contact with people who are sick (direct contact), such as through: ? Shaking hands or hugging. ? Breathing in respiratory droplets that travel through the air. Droplets can be spread by a person breathing, speaking, singing, coughing, or sneezing.  It is less likely that the virus is spread when a person touches a surface or object that has the virus on it (indirect contact). The virus may be able to enter the body if the person touches a surface or object and then touches his or her face, eyes, nose, or mouth. Can a person spread the virus without having symptoms of the disease? It may be possible for the virus to spread before a person has symptoms of the disease, but this is most likely not the main way the virus is spreading. It is more likely for the virus to spread by being in close contact with people who are sick and breathing in the respiratory droplets spread by a person breathing, speaking, singing, coughing, or sneezing. What are the symptoms of coronavirus disease? Symptoms vary from person to  person and can range from mild to severe. Symptoms may include:  Fever or chills.  Cough.  Difficulty breathing or feeling short of breath.  Headaches, body aches, or muscle aches.  Runny or stuffy (congested) nose.  Sore throat.  New loss of taste or smell.  Nausea, vomiting, or diarrhea. These symptoms can appear anywhere from 2 to 14 days after you have been exposed to the virus. Some people may not have any symptoms. If you develop symptoms, call your health care provider. People with severe symptoms may need hospital care. Should I be tested for this virus? Your health care provider will decide whether to test you based on your symptoms, history of exposure, and your risk factors. How does a health care provider test for this virus? Health care providers will collect samples to send for testing. Samples may include:  Taking a swab of fluid from the back of your nose and throat, your nose, or your throat.  Taking fluid from the lungs by having you cough up mucus (sputum) into a sterile cup.  Taking a blood sample. Is there a treatment or vaccine for this virus? Currently, there is no vaccine to prevent coronavirus disease. Also, there are no medicines like antibiotics or antivirals to treat the virus. A person who becomes sick is given supportive care, which means rest and fluids. A person may also  relieve his or her symptoms by using over-the-counter medicines that treat sneezing, coughing, and runny nose. These are the same medicines that a person takes for the common cold. If you develop symptoms, call your health care provider. People with severe symptoms may need hospital care. What can I do to protect myself and my family from this virus?     You can protect yourself and your family by taking the same actions that you would take to prevent the spread of other viruses. Take the following actions:  Wash your hands often with soap and water for at least 20 seconds. If soap  and water are not available, use alcohol-based hand sanitizer.  Avoid touching your face, mouth, nose, or eyes.  Cough or sneeze into a tissue, sleeve, or elbow. Do not cough or sneeze into your hand or the air. ? If you cough or sneeze into a tissue, throw it away immediately and wash your hands.  Disinfect objects and surfaces that you frequently touch every day.  Stay away from people who are sick.  Avoid going out in public, follow guidance from your state and local health authorities.  Avoid crowded indoor spaces. Stay at least 6 ft (2 m) away from others.  If you must go out in public, wear a cloth face covering or face mask. Make sure your mask covers your nose and mouth.  Stay home if you are sick, except to get medical care. Call your health care provider before you get medical care. Your health care provider will tell you how long to stay home.  Make sure your vaccines are up to date. Ask your health care provider what vaccines you need. What should I do if I need to travel? Follow travel recommendations from your local health authority, the CDC, and WHO. Travel information and advice  Centers for Disease Control and Prevention (CDC): BodyEditor.hu  World Health Organization [facility]): ThirdIncome.ca Know the risks and take action to protect your health  You are at higher risk of getting coronavirus disease if you are traveling to areas with an outbreak or if you are exposed to travelers from areas with an outbreak.  Wash your hands often and practice good hygiene to lower the risk of catching or spreading the virus. What should I do if I am sick? General instructions to stop the spread of infection  Wash your hands often with soap and water for at least 20 seconds. If soap and water are not available, use alcohol-based hand sanitizer.  Cough or sneeze into a tissue, sleeve, or  elbow. Do not cough or sneeze into your hand or the air.  If you cough or sneeze into a tissue, throw it away immediately and wash your hands.  Stay home unless you must get medical care. Call your health care provider or local health authority before you get medical care.  Avoid public areas. Do not take public transportation, if possible.  If you can, wear a mask if you must go out of the house or if you are in close contact with someone who is not sick. Make sure your mask covers your nose and mouth. Keep your home clean  Disinfect objects and surfaces that are frequently touched every day. This may include: ? Counters and tables. ? Doorknobs and light switches. ? Sinks and faucets. ? Electronics such as phones, remote controls, keyboards, computers, and tablets.  Wash dishes in hot, soapy water or use a dishwasher. Air-dry your dishes.  [place] laundry in  hot water. Prevent infecting other household members  Let healthy household members care for children and pets, if possible. If you have to care for children or pets, wash your hands often and wear a mask.  Sleep in a different bedroom or bed, if possible.  Do not share personal items, such as razors, toothbrushes, deodorant, combs, brushes, towels, and washcloths. Where to find more information Centers for Disease Control and Prevention (CDC)  Information and news updates: https://www.butler-gonzalez.com/ World Health Organization [facility])  Information and news updates: MissExecutive.com.ee  Coronavirus health topic: https://www.castaneda.info/  Questions and answers on COVID-19: OpportunityDebt.at  Global tracker: who.sprinklr.com American Academy of Pediatrics (AAP)  Information for families: www.healthychildren.org/English/health-issues/conditions/chest-lungs/Pages/2019-Novel-Coronavirus.aspx The coronavirus situation is changing rapidly. Check  your local health authority website or the CDC and [facility] websites for updates and news. When should I contact a health care provider?  Contact your health care provider if you have symptoms of an infection, such as fever or cough, and you: ? Have been near anyone who is known to have coronavirus disease. ? Have come into contact with a person who is suspected to have coronavirus disease. ? Have traveled to an area where there is an outbreak of COVID-19. When should I get emergency medical care?  Get help right away by calling your local emergency services (911 in the [place] if you have: ? Trouble breathing. ? Pain or pressure in your chest. ? Confusion. ? Blue-tinged lips and fingernails. ? Difficulty waking from sleep. ? Symptoms that get worse. Let the emergency medical personnel know if you think you have coronavirus disease. Summary  A new respiratory virus is spreading from person to person and causing COVID-19 (coronavirus disease).  The virus that causes COVID-19 appears to spread easily. It spreads from one person to another through droplets from breathing, speaking, singing, coughing, or sneezing.  Older adults and those with chronic diseases are at higher risk of disease. If you are at higher risk for complications, take extra precautions.  There is currently no vaccine to prevent coronavirus disease. There are no medicines, such as antibiotics or antivirals, to treat the virus.  You can protect yourself and your family by washing your hands often, avoiding touching your face, and covering your coughs and sneezes. This information is not intended to replace advice given to you by your health care provider. Make sure you discuss any questions you have with your health care provider. Document Revised: 04/19/2019 Document Reviewed: 10/16/2018 Elsevier Patient Education  [place].  COVID-19: How to Protect Yourself and Others Know how it spreads  There is currently no  vaccine to prevent coronavirus disease 2019 (COVID-19).  The best way to prevent illness is to avoid being exposed to this virus.  The virus is thought to spread mainly from person-to-person. ? Between people who are in close contact with one another (within about 6 feet). ? Through respiratory droplets produced when an infected person coughs, sneezes or talks. ? These droplets can land in the mouths or noses of people who are nearby or possibly be inhaled into the lungs. ? COVID-19 may be spread by people who are not showing symptoms. Everyone should Clean your hands often  Wash your hands often with soap and water for at least 20 seconds especially after you have been in a public place, or after blowing your nose, coughing, or sneezing.  If soap and water are not readily available, use a hand sanitizer that contains at least 60% alcohol. Cover all surfaces of  your hands and rub them together until they feel dry.  Avoid touching your eyes, nose, and mouth with unwashed hands. Avoid close contact  Limit contact with others as much as possible.  Avoid close contact with people who are sick.  Put distance between yourself and other people. ? Remember that some people without symptoms may be able to spread virus. ? This is especially important for people who are at higher risk of getting very GainPain.com.cy Cover your mouth and nose with a mask when around others  You could spread COVID-19 to others even if you do not feel sick.  Everyone should wear a mask in public settings and when around people not living in their household, especially when social distancing is difficult to maintain. ? Masks should not be placed on young children under age 38, anyone who has trouble breathing, or is unconscious, incapacitated or otherwise unable to remove the mask without assistance.  The mask is meant to protect other people  in case you are infected.  Do NOT use a facemask meant for a Dietitian.  Continue to keep about 6 feet between yourself and others. The mask is not a substitute for social distancing. Cover coughs and sneezes  Always cover your mouth and nose with a tissue when you cough or sneeze or use the inside of your elbow.  Throw used tissues in the trash.  Immediately wash your hands with soap and water for at least 20 seconds. If soap and water are not readily available, clean your hands with a hand sanitizer that contains at least 60% alcohol. Clean and disinfect  Clean AND disinfect frequently touched surfaces daily. This includes tables, doorknobs, light switches, countertops, handles, desks, phones, keyboards, toilets, faucets, and sinks. RackRewards.fr  If surfaces are dirty, clean them: Use detergent or soap and water prior to disinfection.  Then, use a household disinfectant. You can see a list of EPA-registered household disinfectants here. [email or web address] 03/06/2019 This information is not intended to replace advice given to you by your health care provider. Make sure you discuss any questions you have with your health care provider. Document Revised: 03/14/2019 Document Reviewed: 01/10/2019 Elsevier Patient Education  [place]: Quarantine vs. Isolation QUARANTINE keeps someone who was in close contact with someone who has COVID-19 away from others. If you had close contact with a person who has COVID-19  Stay home until 14 days after your last contact.  Check your temperature twice a day and watch for symptoms of COVID-19.  If possible, stay away from people who are at higher-risk for getting very sick from COVID-19. ISOLATION keeps someone who is sick or tested positive for COVID-19 without symptoms away from others, even in their own home. If you are sick and think or know you  have COVID-19  Stay home until after ? At least 10 days since symptoms first appeared and ? At least 24 hours with no fever without fever-reducing medication and ? Symptoms have improved If you tested positive for COVID-19 but do not have symptoms  Stay home until after ? 10 days have passed since your positive test If you live with others, stay in a specific "sick room" or area and away from other people or animals, including pets. Use a separate bathroom, if available. [email or web address] 01/21/2019 This information is not intended to replace advice given to you by your health care provider. Make sure you discuss any questions you have with your health care provider. Document Revised: 06/06/2019  Document Reviewed: 06/06/2019 Elsevier Patient Education  [place].  IMPORTANT INFORMATION: PAY CLOSE ATTENTION   PHYSICIAN DISCHARGE INSTRUCTIONS  Follow with Primary care provider  Patient, No Pcp Per  and other consultants as instructed by your Hospitalist Physician  [place] IF SYMPTOMS COME BACK, WORSEN OR NEW PROBLEM DEVELOPS   Please note: You were cared for by a hospitalist during your hospital stay. Every effort will be made to forward records to your primary care provider.  You can request that your primary care provider send for your hospital records if they have not received them.  Once you are discharged, your primary care physician will handle any further medical issues. Please note that NO REFILLS for any discharge medications will be authorized once you are discharged, as it is imperative that you return to your primary care physician (or establish a relationship with a primary care physician if you do not have one) for your post hospital discharge needs so that they can reassess your need for medications and monitor your lab values.  Please get a complete blood count and chemistry panel checked by your Primary MD at your next visit,  and again as instructed by your Primary MD.  Get Medicines reviewed and adjusted: Please take all your medications with you for your next visit with your Primary MD  Laboratory/radiological data: Please request your Primary MD to go over all hospital tests and procedure/radiological results at the follow up, please ask your primary care provider to get all Hospital records sent to his/her office.  In some cases, they will be blood work, cultures and biopsy results pending at the time of your discharge. Please request that your primary care provider follow up on these results.  If you are diabetic, please bring your blood sugar readings with you to your follow up appointment with primary care.    Please call and make your follow up appointments as soon as possible.    Also Note the following: If you experience worsening of your admission symptoms, develop shortness of breath, life threatening emergency, suicidal or homicidal thoughts you must seek medical attention immediately by calling 911 or calling your MD immediately  if symptoms less severe.  You must read complete instructions/literature along with all the possible adverse reactions/side effects for all the Medicines you take and that have been prescribed to you. Take any new Medicines after you have completely understood and accpet all the possible adverse reactions/side effects.   Do not drive when taking Pain medications or sleeping medications (Benzodiazepines)  Do not take more than prescribed Pain, Sleep and Anxiety Medications. It is not advisable to combine anxiety,sleep and pain medications without talking with your primary care practitioner  Special Instructions: If you have smoked or chewed Tobacco  in the last 2 yrs please stop smoking, stop any regular Alcohol  and or any Recreational drug use.  Wear Seat belts while driving.  Do not drive if taking any narcotic, mind altering or controlled substances or recreational drugs  or alcohol.

## 2019-11-16 LAB — CULTURE, BLOOD (ROUTINE X 2)
Culture: NO GROWTH
Culture: NO GROWTH
Special Requests: ADEQUATE
Special Requests: ADEQUATE

## 2019-12-21 ENCOUNTER — Encounter (HOSPITAL_COMMUNITY): Payer: Self-pay

## 2019-12-21 ENCOUNTER — Other Ambulatory Visit: Payer: Self-pay

## 2019-12-21 ENCOUNTER — Emergency Department (HOSPITAL_COMMUNITY): Payer: Medicaid - Out of State

## 2019-12-21 ENCOUNTER — Inpatient Hospital Stay (HOSPITAL_COMMUNITY)
Admission: EM | Admit: 2019-12-21 | Discharge: 2019-12-31 | DRG: 336 | Disposition: A | Discharge status: Home / Self Care | Payer: Medicaid - Out of State | Attending: Family Medicine | Admitting: Family Medicine

## 2019-12-21 DIAGNOSIS — Z800 Family history of malignant neoplasm of digestive organs: Secondary | ICD-10-CM | POA: Diagnosis not present

## 2019-12-21 DIAGNOSIS — K5651 Intestinal adhesions [bands], with partial obstruction: Secondary | ICD-10-CM | POA: Diagnosis present

## 2019-12-21 DIAGNOSIS — E860 Dehydration: Secondary | ICD-10-CM | POA: Diagnosis present

## 2019-12-21 DIAGNOSIS — N182 Chronic kidney disease, stage 2 (mild): Secondary | ICD-10-CM

## 2019-12-21 DIAGNOSIS — Z8616 Personal history of COVID-19: Secondary | ICD-10-CM | POA: Diagnosis not present

## 2019-12-21 DIAGNOSIS — Z833 Family history of diabetes mellitus: Secondary | ICD-10-CM | POA: Diagnosis not present

## 2019-12-21 DIAGNOSIS — Z8261 Family history of arthritis: Secondary | ICD-10-CM | POA: Diagnosis not present

## 2019-12-21 DIAGNOSIS — F1490 Cocaine use, unspecified, uncomplicated: Secondary | ICD-10-CM | POA: Diagnosis present

## 2019-12-21 DIAGNOSIS — Z91041 Radiographic dye allergy status: Secondary | ICD-10-CM

## 2019-12-21 DIAGNOSIS — N179 Acute kidney failure, unspecified: Secondary | ICD-10-CM | POA: Diagnosis present

## 2019-12-21 DIAGNOSIS — Z8249 Family history of ischemic heart disease and other diseases of the circulatory system: Secondary | ICD-10-CM

## 2019-12-21 DIAGNOSIS — E876 Hypokalemia: Secondary | ICD-10-CM | POA: Diagnosis present

## 2019-12-21 DIAGNOSIS — Z7982 Long term (current) use of aspirin: Secondary | ICD-10-CM | POA: Diagnosis not present

## 2019-12-21 DIAGNOSIS — Z841 Family history of disorders of kidney and ureter: Secondary | ICD-10-CM | POA: Diagnosis not present

## 2019-12-21 DIAGNOSIS — N183 Chronic kidney disease, stage 3 unspecified: Secondary | ICD-10-CM | POA: Diagnosis present

## 2019-12-21 DIAGNOSIS — K56609 Unspecified intestinal obstruction, unspecified as to partial versus complete obstruction: Secondary | ICD-10-CM

## 2019-12-21 DIAGNOSIS — J45909 Unspecified asthma, uncomplicated: Secondary | ICD-10-CM | POA: Diagnosis present

## 2019-12-21 DIAGNOSIS — I10 Essential (primary) hypertension: Secondary | ICD-10-CM | POA: Diagnosis present

## 2019-12-21 DIAGNOSIS — I160 Hypertensive urgency: Secondary | ICD-10-CM | POA: Diagnosis present

## 2019-12-21 DIAGNOSIS — M1990 Unspecified osteoarthritis, unspecified site: Secondary | ICD-10-CM | POA: Diagnosis present

## 2019-12-21 DIAGNOSIS — N184 Chronic kidney disease, stage 4 (severe): Secondary | ICD-10-CM

## 2019-12-21 DIAGNOSIS — Z79899 Other long term (current) drug therapy: Secondary | ICD-10-CM

## 2019-12-21 DIAGNOSIS — R109 Unspecified abdominal pain: Secondary | ICD-10-CM

## 2019-12-21 DIAGNOSIS — I129 Hypertensive chronic kidney disease with stage 1 through stage 4 chronic kidney disease, or unspecified chronic kidney disease: Secondary | ICD-10-CM | POA: Diagnosis present

## 2019-12-21 DIAGNOSIS — N1831 Chronic kidney disease, stage 3a: Secondary | ICD-10-CM | POA: Diagnosis present

## 2019-12-21 HISTORY — DX: Other complications of anesthesia, initial encounter: T88.59XA

## 2019-12-21 HISTORY — DX: Chronic kidney disease, unspecified: N18.9

## 2019-12-21 LAB — COMPREHENSIVE METABOLIC PANEL
ALT: 24 U/L (ref 0–44)
AST: 29 U/L (ref 15–41)
Albumin: 4.5 g/dL (ref 3.5–5.0)
Alkaline Phosphatase: 96 U/L (ref 38–126)
Anion gap: 14 (ref 5–15)
BUN: 24 mg/dL — ABNORMAL HIGH (ref 6–20)
CO2: 29 mmol/L (ref 22–32)
Calcium: 10.2 mg/dL (ref 8.9–10.3)
Chloride: 96 mmol/L — ABNORMAL LOW (ref 98–111)
Creatinine, Ser: 2.43 mg/dL — ABNORMAL HIGH (ref 0.61–1.24)
GFR calc Af Amer: 33 mL/min — ABNORMAL LOW (ref >60)
GFR calc non Af Amer: 29 mL/min — ABNORMAL LOW (ref >60)
Glucose, Bld: 168 mg/dL — ABNORMAL HIGH (ref 70–99)
Potassium: 3.1 mmol/L — ABNORMAL LOW (ref 3.5–5.1)
Sodium: 139 mmol/L (ref 135–145)
Total Bilirubin: 0.7 mg/dL (ref 0.3–1.2)
Total Protein: 8.7 g/dL — ABNORMAL HIGH (ref 6.5–8.1)

## 2019-12-21 LAB — CBC WITH DIFFERENTIAL/PLATELET
Abs Immature Granulocytes: 0.05 10*3/uL (ref 0.00–0.07)
Basophils Absolute: 0.1 10*3/uL (ref 0.0–0.1)
Basophils Relative: 1 %
Eosinophils Absolute: 0.1 10*3/uL (ref 0.0–0.5)
Eosinophils Relative: 1 %
HCT: 47.3 % (ref 39.0–52.0)
Hemoglobin: 15.4 g/dL (ref 13.0–17.0)
Immature Granulocytes: 0 %
Lymphocytes Relative: 14 %
Lymphs Abs: 1.8 10*3/uL (ref 0.7–4.0)
MCH: 31.5 pg (ref 26.0–34.0)
MCHC: 32.6 g/dL (ref 30.0–36.0)
MCV: 96.7 fL (ref 80.0–100.0)
Monocytes Absolute: 0.6 10*3/uL (ref 0.1–1.0)
Monocytes Relative: 4 %
Neutro Abs: 10 10*3/uL — ABNORMAL HIGH (ref 1.7–7.7)
Neutrophils Relative %: 80 %
Platelets: 469 10*3/uL — ABNORMAL HIGH (ref 150–400)
RBC: 4.89 MIL/uL (ref 4.22–5.81)
RDW: 14.1 % (ref 11.5–15.5)
WBC: 12.5 10*3/uL — ABNORMAL HIGH (ref 4.0–10.5)
nRBC: 0 % (ref 0.0–0.2)

## 2019-12-21 LAB — TROPONIN I (HIGH SENSITIVITY)
Troponin I (High Sensitivity): 12 ng/L (ref <18)
Troponin I (High Sensitivity): 10 ng/L (ref <18)

## 2019-12-21 LAB — SARS CORONAVIRUS 2 BY RT PCR (HOSPITAL ORDER, PERFORMED IN ~~LOC~~ HOSPITAL LAB): SARS Coronavirus 2: NEGATIVE

## 2019-12-21 LAB — LIPASE, BLOOD: Lipase: 17 U/L (ref 11–51)

## 2019-12-21 LAB — MAGNESIUM: Magnesium: 2.2 mg/dL (ref 1.7–2.4)

## 2019-12-21 MED ORDER — DIPHENHYDRAMINE HCL 50 MG/ML IJ SOLN
50.0000 mg | Freq: Once | INTRAMUSCULAR | Status: AC
  Administered 2019-12-21: 50 mg via INTRAVENOUS
  Filled 2019-12-21: qty 1

## 2019-12-21 MED ORDER — HYDRALAZINE HCL 20 MG/ML IJ SOLN
10.0000 mg | Freq: Once | INTRAMUSCULAR | Status: AC
  Administered 2019-12-21: 10 mg via INTRAVENOUS
  Filled 2019-12-21: qty 1

## 2019-12-21 MED ORDER — SODIUM CHLORIDE 0.9 % IV BOLUS
1000.0000 mL | Freq: Once | INTRAVENOUS | Status: AC
  Administered 2019-12-21: 1000 mL via INTRAVENOUS

## 2019-12-21 MED ORDER — HYDRALAZINE HCL 20 MG/ML IJ SOLN
10.0000 mg | INTRAMUSCULAR | Status: DC | PRN
  Administered 2019-12-21 – 2019-12-22 (×2): 10 mg via INTRAVENOUS
  Filled 2019-12-21 (×2): qty 1

## 2019-12-21 MED ORDER — HYDROCORTISONE NA SUCCINATE PF 250 MG IJ SOLR
200.0000 mg | Freq: Once | INTRAMUSCULAR | Status: DC
Start: 2019-12-21 — End: 2019-12-21

## 2019-12-21 MED ORDER — HYDROMORPHONE HCL 1 MG/ML IJ SOLN
1.0000 mg | Freq: Once | INTRAMUSCULAR | Status: AC
  Administered 2019-12-21: 1 mg via INTRAVENOUS
  Filled 2019-12-21: qty 1

## 2019-12-21 MED ORDER — DIPHENHYDRAMINE HCL 50 MG/ML IJ SOLN
INTRAMUSCULAR | Status: AC
  Administered 2019-12-21: 50 mg via INTRAVENOUS
  Filled 2019-12-21: qty 1

## 2019-12-21 MED ORDER — ONDANSETRON HCL 4 MG/2ML IJ SOLN
4.0000 mg | Freq: Four times a day (QID) | INTRAMUSCULAR | Status: DC | PRN
  Administered 2019-12-22 – 2019-12-27 (×8): 4 mg via INTRAVENOUS
  Filled 2019-12-21 (×10): qty 2

## 2019-12-21 MED ORDER — POTASSIUM CHLORIDE IN NACL 40-0.9 MEQ/L-% IV SOLN: INTRAVENOUS | Status: DC

## 2019-12-21 MED ORDER — IOHEXOL 350 MG/ML SOLN
80.0000 mL | Freq: Once | INTRAVENOUS | Status: AC | PRN
  Administered 2019-12-21: 80 mL via INTRAVENOUS

## 2019-12-21 MED ORDER — POTASSIUM CHLORIDE 10 MEQ/100ML IV SOLN: 10.0000 meq | Freq: Two times a day (BID) | INTRAVENOUS | Status: DC | PRN

## 2019-12-21 MED ORDER — HYDROCORTISONE NA SUCCINATE PF 100 MG IJ SOLR
200.0000 mg | Freq: Once | INTRAMUSCULAR | Status: AC
  Administered 2019-12-21: 200 mg via INTRAVENOUS
  Filled 2019-12-21: qty 4

## 2019-12-21 MED ORDER — ALBUTEROL SULFATE (2.5 MG/3ML) 0.083% IN NEBU: 3.0000 mL | INHALATION_SOLUTION | RESPIRATORY_TRACT | Status: DC | PRN

## 2019-12-21 MED ORDER — ONDANSETRON HCL 4 MG PO TABS: 4.0000 mg | ORAL_TABLET | Freq: Four times a day (QID) | ORAL | Status: DC | PRN

## 2019-12-21 MED ORDER — HYDROMORPHONE HCL 1 MG/ML IJ SOLN
0.5000 mg | INTRAMUSCULAR | Status: DC | PRN
  Administered 2019-12-21 – 2019-12-25 (×21): 1 mg via INTRAVENOUS
  Filled 2019-12-21 (×24): qty 1

## 2019-12-21 MED ORDER — PANTOPRAZOLE SODIUM 40 MG IV SOLR
40.0000 mg | Freq: Two times a day (BID) | INTRAVENOUS | Status: DC
  Administered 2019-12-21 – 2019-12-27 (×12): 40 mg via INTRAVENOUS
  Filled 2019-12-21 (×13): qty 40

## 2019-12-21 MED ORDER — METOPROLOL TARTRATE 5 MG/5ML IV SOLN
10.0000 mg | Freq: Four times a day (QID) | INTRAVENOUS | Status: DC
  Administered 2019-12-21 – 2019-12-22 (×3): 10 mg via INTRAVENOUS
  Filled 2019-12-21 (×3): qty 10

## 2019-12-21 MED ORDER — DIPHENHYDRAMINE HCL 25 MG PO CAPS: 50.0000 mg | ORAL_CAPSULE | Freq: Once | ORAL | Status: AC

## 2019-12-21 NOTE — ED Triage Notes (Signed)
Pt woke up at 3am this morning with abdominal pain. He said he got dizzy and started having chest pain. Pt denies any cardiac history. Pt laid on the floor in the waiting room.

## 2019-12-21 NOTE — ED Provider Notes (Signed)
[facility] EMERGENCY DEPARTMENT Provider Note   CSN: [id number] Arrival date & time: 12/21/19  0931     History CC: Abdominal pain  Ethan Schmidt is a 60 y.o. male.  History of poorly controlled hypertension presented emergency department abdominal pain and chest pain.  The patient reports that he woke up around 3 AM this morning with abrupt onset of stabbing abdominal pain.  Reports severe pain above his umbilicus does not radiate anywhere.  He said he is never had this kind of pain before.  He said he took some Pepto-Bismol thinking that it might help, but it did not seem to help, in fact he feels like it worsened.  He has had some dry heaves.  He denies any diarrhea.  He also reports is been having chest pain associated with this.  He cannot describe the chest pain to me.  He feels short of breath as well.  He does have a history of recreational drug use and reports that he last used cocaine about a week ago.  He has a history of very poorly controlled hypertension, but he cannot recall his blood pressure medications.  He denies any known history of abdominal aneurysm.  Has allergies to iohexol but was noted to do fine on CT scan with contrast after 1 hour prep.  Per chart review, he last had a dissection study on 06/05/16 with no acute findings, no aneurysm at that time.  He had diverticulosis noted.  He had presented to the ED again with abdominal pain and associated chest pain, nausea and vomiting.  Patient was hospitalized with COVID PNA last month in May 2021  Pt DENIES abdominal surgical history  HPI     Past Medical History:  Diagnosis Date  . Arthritis   . Asthma   . Chronic kidney disease   . Hypertension     Patient Active Problem List   Diagnosis Date Noted  . SBO (small bowel obstruction) ([place]) 12/21/2019  . History of COVID-19 12/21/2019  . Hypertensive urgency 12/21/2019  . Hypokalemia 12/21/2019  . Asthma   . Hypertension   . Vitamin D insufficiency  11/15/2019  . Tobacco abuse 06/29/2014  . Asthma exacerbation 06/29/2014  . Acute renal failure superimposed on stage 3 chronic kidney disease ([place]) 06/29/2014    Past Surgical History:  Procedure Laterality Date  . APPENDECTOMY         Family History  Problem Relation Age of Onset  . Heart attack Mother   . Hypertension Mother   . Heart attack Father   . Hypertension Sister   . Diabetes Sister   . Arthritis Sister   . Hypertension Brother   . Kidney disease Brother   . Diabetes Brother   . Heart disease Brother   . Arthritis Brother   . Hypertension Brother   . Cancer Brother        colon cancer  . Arthritis Brother   . Hypertension Brother   . Arthritis Brother   . Hypertension Brother   . Arthritis Brother   . Hypertension Sister   . Diabetes Sister   . Arthritis Sister   . Hypertension Sister   . Diabetes Sister   . Arthritis Sister   . Hypertension Sister   . Diabetes Sister   . Arthritis Sister   . Hypertension Sister   . Diabetes Sister   . Arthritis Sister   . Hypertension Sister   . Diabetes Sister   . Arthritis Sister   . Hypertension  Sister   . Arthritis Sister   . Hypertension Sister   . Heart attack Sister   . Arthritis Sister     Social History   Tobacco Use  . Smoking status: Never Smoker  . Smokeless tobacco: Never Used  Vaping Use  . Vaping Use: Never used  Substance Use Topics  . Alcohol use: Yes    Comment: on holidays and special occ.  . Drug use: Yes    Types: Marijuana, Cocaine    Home Medications Prior to Admission medications   Medication Sig Start Date End Date Taking? Authorizing Provider  albuterol (VENTOLIN HFA) 108 (90 Base) MCG/ACT inhaler Inhale 1-2 puffs into the lungs every 4 (four) hours as needed for wheezing or shortness of breath. 11/15/19   Johnson, Clanford L, MD  amLODipine (NORVASC) 10 MG tablet Take 1 tablet (10 mg total) by mouth daily. 08/25/19   Milton Ferguson, MD  ascorbic acid (VITAMIN C) 500 MG  tablet Take 1 tablet (500 mg total) by mouth daily. 11/16/19   Johnson, Clanford L, MD  aspirin 81 MG tablet Take 81 mg by mouth daily.    [provider]  Cholecalciferol (VITAMIN D) 125 MCG (5000 UT) CAPS Take 1 capsule by mouth daily. 11/15/19   Johnson, Clanford L, MD  guaiFENesin-dextromethorphan (ROBITUSSIN DM) 100-10 MG/5ML syrup Take 10 mLs by mouth every 4 (four) hours as needed for cough. 11/15/19   Johnson, Clanford L, MD  lisinopril (ZESTRIL) 20 MG tablet Take 0.5 tablets (10 mg total) by mouth daily. 11/15/19   Johnson, Clanford L, MD  loperamide (IMODIUM) 2 MG capsule Take 1 capsule (2 mg total) by mouth as needed for diarrhea or loose stools. 11/10/19   Manuella Ghazi, Pratik D, DO  metoprolol tartrate (LOPRESSOR) 100 MG tablet Take 1 tablet (100 mg total) by mouth 2 (two) times daily. 08/25/19   Milton Ferguson, MD  ondansetron (ZOFRAN) 4 MG tablet Take 1 tablet (4 mg total) by mouth daily as needed for nausea or vomiting. 11/10/19 11/09/20  Manuella Ghazi, Pratik D, DO  pantoprazole (PROTONIX) 40 MG tablet Take 1 tablet (40 mg total) by mouth daily. 11/15/19 11/14/20  Murlean Iba, MD    Allergies    Shellfish allergy and Omnipaque [iohexol]  Review of Systems   Review of Systems  Constitutional: Negative for chills and fever.  Eyes: Negative for photophobia and visual disturbance.  Respiratory: Positive for shortness of breath. Negative for cough.   Cardiovascular: Positive for chest pain. Negative for palpitations.  Gastrointestinal: Positive for abdominal pain and nausea.  Musculoskeletal: Negative for arthralgias and back pain.  Skin: Negative for color change and rash.  Neurological: Negative for syncope and light-headedness.  All other systems reviewed and are negative.   Physical Exam Updated Vital Signs BP (!) 181/119   Pulse 77   Temp 98.1 F (36.7 C)   Resp 17   Ht 6\' 2"  (1.88 m)   Wt 104.3 kg   SpO2 99%   BMI 29.52 kg/m   Physical Exam Vitals and nursing note  reviewed.  Constitutional:      General: He is in acute distress.     Appearance: He is well-developed.     Comments: Twisting on stretcher  HENT:     Head: Normocephalic and atraumatic.  Eyes:     Conjunctiva/sclera: Conjunctivae normal.     Pupils: Pupils are equal, round, and reactive to light.  Cardiovascular:     Rate and Rhythm: Normal rate and regular rhythm.  Pulses: Normal pulses.  Pulmonary:     Effort: Pulmonary effort is normal. No respiratory distress.     Breath sounds: Normal breath sounds.  Abdominal:     General: There is no distension.     Palpations: Abdomen is soft.     Tenderness: There is abdominal tenderness. There is no guarding or rebound.     Comments: Midline epigastric ttp No pulsatile abdominal mass  Musculoskeletal:     Cervical back: Neck supple.  Skin:    General: Skin is warm and dry.  Neurological:     Mental Status: He is alert and oriented to person, place, and time.     ED Results / Procedures / Treatments   Labs (all labs ordered are listed, but only abnormal results are displayed) Labs Reviewed  COMPREHENSIVE METABOLIC PANEL - Abnormal; Notable for the following components:      Result Value   Potassium 3.1 (*)    Chloride 96 (*)    Glucose, Bld 168 (*)    BUN 24 (*)    Creatinine, Ser 2.43 (*)    Total Protein 8.7 (*)    GFR calc non Af Amer 29 (*)    GFR calc Af Amer 33 (*)    All other components within normal limits  CBC WITH DIFFERENTIAL/PLATELET - Abnormal; Notable for the following components:   WBC 12.5 (*)    Platelets 469 (*)    Neutro Abs 10.0 (*)    All other components within normal limits  SARS CORONAVIRUS 2 BY RT PCR (HOSPITAL ORDER, [place] [facility])  LIPASE, BLOOD  MAGNESIUM  URINALYSIS, ROUTINE W REFLEX MICROSCOPIC  TROPONIN I (HIGH SENSITIVITY)  TROPONIN I (HIGH SENSITIVITY)    EKG EKG Interpretation  Date/Time:  Saturday December 21 2019 09:49:13 EDT Ventricular Rate:  92 PR  Interval:    QRS Duration: 87 QT Interval:  399 QTC Calculation: 494 R Axis:   52 Text Interpretation: Sinus rhythm LAE, consider biatrial enlargement Probable left ventricular hypertrophy Anterior ST elevation, probably due to LVH Borderline prolonged QT interval No sig change from prior ecg, no STEMI Confirmed by Octaviano Glow [phone]) on 12/21/2019 9:55:11 AM   Radiology CT Angio Chest/Abd/Pel for Dissection W and/or Wo Contrast  Result Date: 12/21/2019 CLINICAL DATA:  Tearing chest and abdominal pain EXAM: CT ANGIOGRAPHY CHEST, ABDOMEN AND PELVIS TECHNIQUE: Multidetector CT imaging through the chest, abdomen and pelvis was performed using the standard protocol during bolus administration of intravenous contrast. Multiplanar reconstructed images and MIPs were obtained and reviewed to evaluate the vascular anatomy. CONTRAST:  26mL OMNIPAQUE IOHEXOL 350 MG/ML SOLN after 4 hour protocol prep for contrast allergy COMPARISON:  06/05/2016 FINDINGS: CTA CHEST FINDINGS Cardiovascular: Heart size upper limits normal. No pericardial effusion. The RV is nondilated. Fair contrast opacification pulmonary arterial tree; the exam was not optimized for detection of pulmonary emboli. There is good contrast opacification of the thoracic aorta. No aneurysm. No dissection. No stenosis. Classic 3 vessel brachiocephalic arterial origin anatomy without proximal stenosis. No significant atheromatous change. Mediastinum/Nodes: Small hiatal hernia. No hilar or mediastinal adenopathy. Lungs/Pleura: No pleural effusion. No pneumothorax. Linear atelectasis or scarring in the lung bases. Musculoskeletal: No chest wall abnormality. No acute or significant osseous findings. Review of the MIP images confirms the above findings. CTA ABDOMEN AND PELVIS FINDINGS VASCULAR Aorta: Normal caliber aorta without aneurysm, dissection, vasculitis or significant stenosis. Celiac: Patent without evidence of aneurysm, dissection, vasculitis or  significant stenosis. SMA: Patent without evidence of  aneurysm, dissection, vasculitis or significant stenosis. Renals: Both renal arteries are patent without evidence of aneurysm, dissection, vasculitis, fibromuscular dysplasia or significant stenosis. IMA: Patent without evidence of aneurysm, dissection, vasculitis or significant stenosis. Inflow: Minimal plaque at the aortic bifurcation. No dissection, aneurysm, or stenosis. Veins: No obvious venous abnormality within the limitations of this arterial phase study. Review of the MIP images confirms the above findings. NON-VASCULAR Hepatobiliary: No focal liver abnormality is seen. No gallstones, gallbladder wall thickening, or biliary dilatation. Pancreas: Unremarkable. No pancreatic ductal dilatation or surrounding inflammatory changes. Spleen: Normal in size without focal abnormality. Adrenals/Urinary Tract: Adrenal glands unremarkable. Kidneys enhance symmetrically without hydronephrosis. Urinary bladder physiologically distended. Stomach/Bowel: The stomach is decompressed. Duodenum unremarkable. There are multiple dilated mid abdominal small bowel loops. There is some edema in the mesentery on the right. Abrupt transition to decompressed distal small bowel just inferior to the umbilicus to the right of midline, without associated mass, abscess, or other pathologic finding. Colon is nondilated, with a few distal descending and sigmoid diverticula, without adjacent inflammatory change or abscess. Lymphatic: No abdominal or pelvic adenopathy. Reproductive: Prostate is unremarkable. Other: Bilateral pelvic phleboliths. Small volume pelvic and perihepatic ascites. No free air. Musculoskeletal: Spondylitic changes in the lower lumbar spine. No fracture or worrisome bone lesion. Review of the MIP images confirms the above findings. IMPRESSION: 1. Negative for aortic dissection or other acute vascular findings. 2. Mid/distal high-grade small bowel obstruction without  evident etiology, suggesting adhesions. 3. Small volume pelvic and perihepatic ascites. 4. Descending and sigmoid diverticulosis. 5. Small hiatal hernia. Electronically Signed   By: Lucrezia Europe M.D.   On: 12/21/2019 14:27    Procedures .Critical Care Performed by: Wyvonnia Dusky, MD Authorized by: Wyvonnia Dusky, MD   Critical care provider statement:    Critical care time (minutes):  45   Critical care was necessary to treat or prevent imminent or life-threatening deterioration of the following conditions:  Metabolic crisis and circulatory failure   Critical care was time spent personally by me on the following activities:  Discussions with consultants, evaluation of patient's response to treatment, examination of patient, ordering and performing treatments and interventions, ordering and review of laboratory studies, ordering and review of radiographic studies, pulse oximetry, re-evaluation of patient's condition, obtaining history from patient or surrogate and review of old charts Comments:     Hypertensive urgency requiring multiple rounds of IV blood pressure meds IV potassium for repletion for bowel obstruction   (including critical care time)  Medications Ordered in ED Medications  potassium chloride 10 mEq in 100 mL IVPB (has no administration in time range)  metoprolol tartrate (LOPRESSOR) injection 10 mg (10 mg Intravenous Given 12/21/19 1554)  HYDROmorphone (DILAUDID) injection 1 mg (1 mg Intravenous Given 12/21/19 1010)  diphenhydrAMINE (BENADRYL) capsule 50 mg ( Oral See Alternative 12/21/19 1316)    Or  diphenhydrAMINE (BENADRYL) injection 50 mg (50 mg Intravenous Given 12/21/19 1316)  hydrocortisone sodium succinate (SOLU-CORTEF) 100 MG injection 200 mg (200 mg Intravenous Given 12/21/19 1017)  sodium chloride 0.9 % bolus 1,000 mL (0 mLs Intravenous Stopped 12/21/19 1315)  iohexol (OMNIPAQUE) 350 MG/ML injection 80 mL (80 mLs Intravenous Contrast Given 12/21/19 1351)   hydrALAZINE (APRESOLINE) injection 10 mg (10 mg Intravenous Given 12/21/19 1456)  HYDROmorphone (DILAUDID) injection 1 mg (1 mg Intravenous Given 12/21/19 1456)    ED Course  I have reviewed the triage vital signs and the nursing notes.  Pertinent labs & imaging results that were available during  my care of the patient were reviewed by me and considered in my medical decision making (see chart for details).  This is a 59 year old gentleman presenting emergency department with abrupt onset of abdominal pain, nausea and vomiting, associated chest pain.  He had a similar presentation 2017 when he presented to the ER, at which point he had a dissection study which was unremarkable.  Differential today includes atypical ACS vs gastritis vs bowel obstruction vs diverticulitis vs pancreatitis vs other  Patient was hospitalized with COVID PNA last month in May 2021  Plan for labs, IV pain medications, IV contrast allergy prep (1 hour emergency prep) and anticipate repeat CT scan.  Vitals stable on arrival  ECG per my interpretation with sinus tachycardia, no STEMI.  Trop 12 -> 10.  Doubt ACS.  Maintain airborne precautions with Covid (+) in the past 3 months  Medical chart reviewed including recent COVID hospitalization IV dilaudid given for pain. IV hydralazine x 2 ordered for hypertensive urgency as patient could not tolerate PO and had SBP > 200 mmhg IV K ordered for hypokalemia Magnesium normal   Gen surgery consulted as noted below I personally reviewed the patient's labwork and CT imaging    Clinical Course as of Dec 21 1738  Sat Dec 21, 2019  1434 IMPRESSION: 1. Negative for aortic dissection or other acute vascular findings. 2. Mid/distal high-grade small bowel obstruction without evident etiology, suggesting adhesions. 3. Small volume pelvic and perihepatic ascites. 4. Descending and sigmoid diverticulosis. 5. Small hiatal hernia.   [MT]  3557 Patient still having  abdominal pain, no vomiting, will give more dilaudid as well as hydralazine for HTN.  Spoke to Dr Arnoldo Morale of general surgery for SBO, recommended medical admit, if patient starts vomiting can consider NG tube, surgery to see tomorrow   [MT]  1523 Admitted to hospitalist   [MT]    Clinical Course User Index [MT] Wyland Rastetter, Carola Rhine, MD    Final Clinical Impression(s) / ED Diagnoses Final diagnoses:  None    Rx / DC Orders ED Discharge Orders    None       Wyvonnia Dusky, MD 12/21/19 [phone]

## 2019-12-21 NOTE — H&P (Signed)
History and Physical  Ebb Carelock JQB:[id number] DOB: 12/07/1960 DOA: 12/21/2019  Referring physician: Dr Langston Masker, ED physician PCP: Patient, No Pcp Per  Outpatient Specialists:   Patient Coming From: home  Chief Complaint: abdominal pain  HPI: Ethan Schmidt is a 59 y.o. male with a history of uncontrolled hypertension, stage III chronic kidney disease, asthma, arthritis.  Patient seen for onset of severe stabbing abdominal pain around and above his umbilicus that started around 3 AM and has been worsening.  He attempted to take Pepto-Bismol, which was not effective.  He has had some dry heaves earlier today, but has not had anything now.  Pain radiates into his back.  His pain is improved with IV pain medicine.  No other palliating or provoking factors.  Denies diarrhea, constipation, fevers, chills.  Denies chest pain.  He does have a history of drug use with last cocaine use about a week ago.  Emergency Department Course: CT angiogram of his chest, abdomen, pelvis shows high-grade small bowel obstruction.  No evidence of dissection.  General surgery was consulted, who recommended admission.  Did not feel NG tube was warranted at this time as the patient not currently dry heaving or vomiting.  Review of Systems:    Pt denies any fevers, chills, nausea, vomiting, diarrhea, constipation, abdominal pain, shortness of breath, dyspnea on exertion, orthopnea, cough, wheezing, palpitations, headache, vision changes, lightheadedness, dizziness, melena, rectal bleeding.  Review of systems are otherwise negative  Past Medical History:  Diagnosis Date  . Arthritis   . Asthma   . Chronic kidney disease   . Hypertension    Past Surgical History:  Procedure Laterality Date  . APPENDECTOMY     Social History:  reports that he has never smoked. He has never used smokeless tobacco. He reports current alcohol use. He reports current drug use. Drugs: Marijuana and Cocaine. Patient lives at  home  Allergies  Allergen Reactions  . Shellfish Allergy Anaphylaxis  . Omnipaque [Iohexol] Hives and Swelling    Pt. Did fine with 1 hour pre medication.    Family History  Problem Relation Age of Onset  . Heart attack Mother   . Hypertension Mother   . Heart attack Father   . Hypertension Sister   . Diabetes Sister   . Arthritis Sister   . Hypertension Brother   . Kidney disease Brother   . Diabetes Brother   . Heart disease Brother   . Arthritis Brother   . Hypertension Brother   . Cancer Brother        colon cancer  . Arthritis Brother   . Hypertension Brother   . Arthritis Brother   . Hypertension Brother   . Arthritis Brother   . Hypertension Sister   . Diabetes Sister   . Arthritis Sister   . Hypertension Sister   . Diabetes Sister   . Arthritis Sister   . Hypertension Sister   . Diabetes Sister   . Arthritis Sister   . Hypertension Sister   . Diabetes Sister   . Arthritis Sister   . Hypertension Sister   . Diabetes Sister   . Arthritis Sister   . Hypertension Sister   . Arthritis Sister   . Hypertension Sister   . Heart attack Sister   . Arthritis Sister       Prior to Admission medications   Medication Sig Start Date End Date Taking? Authorizing Provider  albuterol (VENTOLIN HFA) 108 (90 Base) MCG/ACT inhaler Inhale 1-2 puffs into  the lungs every 4 (four) hours as needed for wheezing or shortness of breath. 11/15/19   Johnson, Clanford L, MD  amLODipine (NORVASC) 10 MG tablet Take 1 tablet (10 mg total) by mouth daily. 08/25/19   Milton Ferguson, MD  ascorbic acid (VITAMIN C) 500 MG tablet Take 1 tablet (500 mg total) by mouth daily. 11/16/19   Johnson, Clanford L, MD  aspirin 81 MG tablet Take 81 mg by mouth daily.    [provider]  Cholecalciferol (VITAMIN D) 125 MCG (5000 UT) CAPS Take 1 capsule by mouth daily. 11/15/19   Johnson, Clanford L, MD  guaiFENesin-dextromethorphan (ROBITUSSIN DM) 100-10 MG/5ML syrup Take 10 mLs by mouth every 4  (four) hours as needed for cough. 11/15/19   Johnson, Clanford L, MD  lisinopril (ZESTRIL) 20 MG tablet Take 0.5 tablets (10 mg total) by mouth daily. 11/15/19   Johnson, Clanford L, MD  loperamide (IMODIUM) 2 MG capsule Take 1 capsule (2 mg total) by mouth as needed for diarrhea or loose stools. 11/10/19   Manuella Ghazi, Pratik D, DO  metoprolol tartrate (LOPRESSOR) 100 MG tablet Take 1 tablet (100 mg total) by mouth 2 (two) times daily. 08/25/19   Milton Ferguson, MD  ondansetron (ZOFRAN) 4 MG tablet Take 1 tablet (4 mg total) by mouth daily as needed for nausea or vomiting. 11/10/19 11/09/20  Manuella Ghazi, Pratik D, DO  pantoprazole (PROTONIX) 40 MG tablet Take 1 tablet (40 mg total) by mouth daily. 11/15/19 11/14/20  Murlean Iba, MD    Physical Exam: BP (!) 157/121   Pulse 94   Temp 98.1 F (36.7 C)   Resp 11   Ht 6\' 2"  (1.88 m)   Wt 104.3 kg   SpO2 99%   BMI 29.52 kg/m   . General: Middle-age male. Awake and alert and oriented x3. No acute cardiopulmonary distress.  Marland Kitchen HEENT: Normocephalic atraumatic.  Right and left ears normal in appearance.  Pupils equal, round, reactive to light. Extraocular muscles are intact. Sclerae anicteric and noninjected.  Moist mucosal membranes. No mucosal lesions.  . Neck: Neck supple without lymphadenopathy. No carotid bruits. No masses palpated.  . Cardiovascular: Regular rate with normal S1-S2 sounds. No murmurs, rubs, gallops auscultated. No JVD.  Marland Kitchen Respiratory: Good respiratory effort with no wheezes, rales, rhonchi. Lungs clear to auscultation bilaterally.  No accessory muscle use. . Abdomen: Soft, significant tenderness around the periumbilical and upper abdomen. nondistended.  Absent bowel sounds. No masses or hepatosplenomegaly  . Skin: No rashes, lesions, or ulcerations.  Dry, warm to touch. 2+ dorsalis pedis and radial pulses. . Musculoskeletal: No calf or leg pain. All major joints not erythematous nontender.  No upper or lower joint deformation.  Good ROM.  No  contractures  . Psychiatric: Intact judgment and insight. Pleasant and cooperative. . Neurologic: No focal neurological deficits. Strength is 5/5 and symmetric in upper and lower extremities.  Cranial nerves II through XII are grossly intact.           Labs on Admission: I have personally reviewed following labs and imaging studies  CBC: Recent Labs  Lab 12/21/19 1011  WBC 12.5*  NEUTROABS 10.0*  HGB 15.4  HCT 47.3  MCV 96.7  PLT 983*   Basic Metabolic Panel: Recent Labs  Lab 12/21/19 1011  NA 139  K 3.1*  CL 96*  CO2 29  GLUCOSE 168*  BUN 24*  CREATININE 2.43*  CALCIUM 10.2  MG 2.2   GFR: Estimated Creatinine Clearance: 43.7 mL/min (A) (by  C-G formula based on SCr of 2.43 mg/dL (H)). Liver Function Tests: Recent Labs  Lab 12/21/19 1011  AST 29  ALT 24  ALKPHOS 96  BILITOT 0.7  PROT 8.7*  ALBUMIN 4.5   Recent Labs  Lab 12/21/19 1011  LIPASE 17   No results for input(s): AMMONIA in the last 168 hours. Coagulation Profile: No results for input(s): INR, PROTIME in the last 168 hours. Cardiac Enzymes: No results for input(s): CKTOTAL, CKMB, CKMBINDEX, TROPONINI in the last 168 hours. BNP (last 3 results) No results for input(s): PROBNP in the last 8760 hours. HbA1C: No results for input(s): HGBA1C in the last 72 hours. CBG: No results for input(s): GLUCAP in the last 168 hours. Lipid Profile: No results for input(s): CHOL, HDL, LDLCALC, TRIG, CHOLHDL, LDLDIRECT in the last 72 hours. Thyroid Function Tests: No results for input(s): TSH, T4TOTAL, FREET4, T3FREE, THYROIDAB in the last 72 hours. Anemia Panel: No results for input(s): VITAMINB12, FOLATE, FERRITIN, TIBC, IRON, RETICCTPCT in the last 72 hours. Urine analysis:    Component Value Date/Time   COLORURINE YELLOW 11/11/2019 0353   APPEARANCEUR CLEAR 11/11/2019 0353   LABSPEC 1.020 11/11/2019 0353   PHURINE 7.0 11/11/2019 0353   GLUCOSEU NEGATIVE 11/11/2019 0353   HGBUR LARGE (A) 11/11/2019  0353   BILIRUBINUR NEGATIVE 11/11/2019 0353   KETONESUR NEGATIVE 11/11/2019 0353   PROTEINUR 100 (A) 11/11/2019 0353   UROBILINOGEN 0.2 11/23/2011 0130   NITRITE NEGATIVE 11/11/2019 0353   LEUKOCYTESUR NEGATIVE 11/11/2019 0353   Sepsis Labs: @LABRCNTIP (procalcitonin:4,lacticidven:4) )No results found for this or any previous visit (from the past 240 hour(s)).   Radiological Exams on Admission: CT Angio Chest/Abd/Pel for Dissection W and/or Wo Contrast  Result Date: 12/21/2019 CLINICAL DATA:  Tearing chest and abdominal pain EXAM: CT ANGIOGRAPHY CHEST, ABDOMEN AND PELVIS TECHNIQUE: Multidetector CT imaging through the chest, abdomen and pelvis was performed using the standard protocol during bolus administration of intravenous contrast. Multiplanar reconstructed images and MIPs were obtained and reviewed to evaluate the vascular anatomy. CONTRAST:  34mL OMNIPAQUE IOHEXOL 350 MG/ML SOLN after 4 hour protocol prep for contrast allergy COMPARISON:  06/05/2016 FINDINGS: CTA CHEST FINDINGS Cardiovascular: Heart size upper limits normal. No pericardial effusion. The RV is nondilated. Fair contrast opacification pulmonary arterial tree; the exam was not optimized for detection of pulmonary emboli. There is good contrast opacification of the thoracic aorta. No aneurysm. No dissection. No stenosis. Classic 3 vessel brachiocephalic arterial origin anatomy without proximal stenosis. No significant atheromatous change. Mediastinum/Nodes: Small hiatal hernia. No hilar or mediastinal adenopathy. Lungs/Pleura: No pleural effusion. No pneumothorax. Linear atelectasis or scarring in the lung bases. Musculoskeletal: No chest wall abnormality. No acute or significant osseous findings. Review of the MIP images confirms the above findings. CTA ABDOMEN AND PELVIS FINDINGS VASCULAR Aorta: Normal caliber aorta without aneurysm, dissection, vasculitis or significant stenosis. Celiac: Patent without evidence of aneurysm,  dissection, vasculitis or significant stenosis. SMA: Patent without evidence of aneurysm, dissection, vasculitis or significant stenosis. Renals: Both renal arteries are patent without evidence of aneurysm, dissection, vasculitis, fibromuscular dysplasia or significant stenosis. IMA: Patent without evidence of aneurysm, dissection, vasculitis or significant stenosis. Inflow: Minimal plaque at the aortic bifurcation. No dissection, aneurysm, or stenosis. Veins: No obvious venous abnormality within the limitations of this arterial phase study. Review of the MIP images confirms the above findings. NON-VASCULAR Hepatobiliary: No focal liver abnormality is seen. No gallstones, gallbladder wall thickening, or biliary dilatation. Pancreas: Unremarkable. No pancreatic ductal dilatation or surrounding inflammatory changes. Spleen: Normal  in size without focal abnormality. Adrenals/Urinary Tract: Adrenal glands unremarkable. Kidneys enhance symmetrically without hydronephrosis. Urinary bladder physiologically distended. Stomach/Bowel: The stomach is decompressed. Duodenum unremarkable. There are multiple dilated mid abdominal small bowel loops. There is some edema in the mesentery on the right. Abrupt transition to decompressed distal small bowel just inferior to the umbilicus to the right of midline, without associated mass, abscess, or other pathologic finding. Colon is nondilated, with a few distal descending and sigmoid diverticula, without adjacent inflammatory change or abscess. Lymphatic: No abdominal or pelvic adenopathy. Reproductive: Prostate is unremarkable. Other: Bilateral pelvic phleboliths. Small volume pelvic and perihepatic ascites. No free air. Musculoskeletal: Spondylitic changes in the lower lumbar spine. No fracture or worrisome bone lesion. Review of the MIP images confirms the above findings. IMPRESSION: 1. Negative for aortic dissection or other acute vascular findings. 2. Mid/distal high-grade small  bowel obstruction without evident etiology, suggesting adhesions. 3. Small volume pelvic and perihepatic ascites. 4. Descending and sigmoid diverticulosis. 5. Small hiatal hernia. Electronically Signed   By: Lucrezia Europe M.D.   On: 12/21/2019 14:27    EKG: Independently reviewed.  Sinus rhythm.  Left atrial enlargement.  LVH.  No acute ST changes.  Assessment/Plan: Principal Problem:   SBO (small bowel obstruction) (HCC) Active Problems:   Acute renal failure superimposed on stage 3 chronic kidney disease ([place])   History of COVID-19   Asthma   Hypertension   Hypertensive urgency   Hypokalemia    This patient was discussed with the ED physician, including pertinent vitals, physical exam findings, labs, and imaging.  We also discussed care given by the ED provider.  1. Small bowel obstruction a. Admit to stepdown due to mu score being 5 b. IV fluids c. N.p.o. d. General surgery to consult in the morning 2. Acute renal failure imposed on stage III chronic kidney disease a. IV fluids b. Check creatinine in the morning 3. Hypokalemia a. Replace potassium b. Recheck potassium in the morning 4. History of COVID-19 a. No pulmonary issues current 5. Hypertensive urgency with uncontrolled chronic hypertension a. IV hydralazine, IV metoprolol 6. Asthma a. Currently compensated  DVT prophylaxis: SCDs Consultants: General surgery Code Status: Full code Family Communication: None Disposition Plan: Patient should be able to return home following resolution of small bowel obstruction   Truett Mainland, DO

## 2019-12-22 DIAGNOSIS — K56609 Unspecified intestinal obstruction, unspecified as to partial versus complete obstruction: Secondary | ICD-10-CM

## 2019-12-22 LAB — CBC
HCT: 53.6 % — ABNORMAL HIGH (ref 39.0–52.0)
Hemoglobin: 17 g/dL (ref 13.0–17.0)
MCH: 31.9 pg (ref 26.0–34.0)
MCHC: 31.7 g/dL (ref 30.0–36.0)
MCV: 100.6 fL — ABNORMAL HIGH (ref 80.0–100.0)
Platelets: 477 10*3/uL — ABNORMAL HIGH (ref 150–400)
RBC: 5.33 MIL/uL (ref 4.22–5.81)
RDW: 14.8 % (ref 11.5–15.5)
WBC: 20.5 10*3/uL — ABNORMAL HIGH (ref 4.0–10.5)
nRBC: 0 % (ref 0.0–0.2)

## 2019-12-22 LAB — URINALYSIS, MICROSCOPIC (REFLEX)
Bacteria, UA: NONE SEEN
Squamous Epithelial / HPF: NONE SEEN (ref 0–5)
WBC, UA: NONE SEEN WBC/hpf (ref 0–5)

## 2019-12-22 LAB — URINALYSIS, ROUTINE W REFLEX MICROSCOPIC
Bilirubin Urine: NEGATIVE
Glucose, UA: NEGATIVE mg/dL
Hgb urine dipstick: NEGATIVE
Ketones, ur: NEGATIVE mg/dL
Leukocytes,Ua: NEGATIVE
Nitrite: NEGATIVE
Protein, ur: 100 mg/dL — AB
Specific Gravity, Urine: 1.025 (ref 1.005–1.030)
pH: 6 (ref 5.0–8.0)

## 2019-12-22 LAB — COMPREHENSIVE METABOLIC PANEL
ALT: 18 U/L (ref 0–44)
AST: 24 U/L (ref 15–41)
Albumin: 4 g/dL (ref 3.5–5.0)
Alkaline Phosphatase: 98 U/L (ref 38–126)
Anion gap: 18 — ABNORMAL HIGH (ref 5–15)
BUN: 29 mg/dL — ABNORMAL HIGH (ref 6–20)
CO2: 27 mmol/L (ref 22–32)
Calcium: 9.6 mg/dL (ref 8.9–10.3)
Chloride: 96 mmol/L — ABNORMAL LOW (ref 98–111)
Creatinine, Ser: 2.19 mg/dL — ABNORMAL HIGH (ref 0.61–1.24)
GFR calc Af Amer: 38 mL/min — ABNORMAL LOW (ref >60)
GFR calc non Af Amer: 32 mL/min — ABNORMAL LOW (ref >60)
Glucose, Bld: 134 mg/dL — ABNORMAL HIGH (ref 70–99)
Potassium: 3.5 mmol/L (ref 3.5–5.1)
Sodium: 141 mmol/L (ref 135–145)
Total Bilirubin: 0.6 mg/dL (ref 0.3–1.2)
Total Protein: 8.3 g/dL — ABNORMAL HIGH (ref 6.5–8.1)

## 2019-12-22 LAB — MAGNESIUM: Magnesium: 2.2 mg/dL (ref 1.7–2.4)

## 2019-12-22 LAB — RAPID URINE DRUG SCREEN, HOSP PERFORMED
Amphetamines: NOT DETECTED
Barbiturates: NOT DETECTED
Benzodiazepines: NOT DETECTED
Cocaine: POSITIVE — AB
Opiates: POSITIVE — AB
Tetrahydrocannabinol: POSITIVE — AB

## 2019-12-22 LAB — CREATININE, URINE, RANDOM: Creatinine, Urine: 332.05 mg/dL

## 2019-12-22 LAB — SODIUM, URINE, RANDOM: Sodium, Ur: 15 mmol/L

## 2019-12-22 MED ORDER — CLONIDINE HCL 0.1 MG PO TABS
0.1000 mg | ORAL_TABLET | Freq: Two times a day (BID) | ORAL | Status: DC
  Filled 2019-12-22: qty 1

## 2019-12-22 MED ORDER — HYDRALAZINE HCL 20 MG/ML IJ SOLN
10.0000 mg | Freq: Once | INTRAMUSCULAR | Status: AC
  Administered 2019-12-22: 10 mg via INTRAVENOUS

## 2019-12-22 MED ORDER — LABETALOL HCL 5 MG/ML IV SOLN
10.0000 mg | INTRAVENOUS | Status: DC | PRN
  Filled 2019-12-22: qty 4

## 2019-12-22 MED ORDER — HYDRALAZINE HCL 20 MG/ML IJ SOLN
20.0000 mg | INTRAMUSCULAR | Status: DC | PRN
  Administered 2019-12-22: 20 mg via INTRAVENOUS
  Filled 2019-12-22 (×2): qty 1

## 2019-12-22 MED ORDER — AMLODIPINE BESYLATE 5 MG PO TABS
10.0000 mg | ORAL_TABLET | Freq: Every day | ORAL | Status: DC
  Administered 2019-12-22 – 2019-12-31 (×9): 10 mg via ORAL
  Filled 2019-12-22 (×10): qty 2

## 2019-12-22 MED ORDER — LACTATED RINGERS IV BOLUS
1000.0000 mL | Freq: Once | INTRAVENOUS | Status: AC
  Administered 2019-12-22: 1000 mL via INTRAVENOUS

## 2019-12-22 NOTE — Progress Notes (Addendum)
Pt c/o abd pain, rated 8/10. PRN pain med given per order. Pt denies any further nausea but is no longer begging for water or ice chips, states understanding of NPO order. States, "that stuff made by stomach hurt again". MD Manuella Ghazi notified of elevated B/P. No new orders. Will recheck once pain med takes effect and if still elevated admin previously ordered antihypertensives.

## 2019-12-22 NOTE — Progress Notes (Signed)
PROGRESS NOTE    Ethan Schmidt  WGN:[id number] DOB: [birth year] DOA: 12/21/2019 PCP: Patient, No Pcp Per   Brief Narrative:  Per HPI:  Ethan Schmidt is a 59 y.o. male with a history of uncontrolled hypertension, stage III chronic kidney disease, asthma, arthritis.  Patient seen for onset of severe stabbing abdominal pain around and above his umbilicus that started around 3 AM and has been worsening.  He attempted to take Pepto-Bismol, which was not effective.  He has had some dry heaves earlier today, but has not had anything now.  Pain radiates into his back.  His pain is improved with IV pain medicine.  No other palliating or provoking factors.  Denies diarrhea, constipation, fevers, chills.  Denies chest pain.  He does have a history of drug use with last cocaine use about a week ago.  Emergency Department Course: CT angiogram of his chest, abdomen, pelvis shows high-grade small bowel obstruction.  No evidence of dissection.  General surgery was consulted, who recommended admission.  Did not feel NG tube was warranted at this time as the patient not currently dry heaving or vomiting.   Assessment & Plan:   Principal Problem:   SBO (small bowel obstruction) (HCC) Active Problems:   Acute renal failure superimposed on stage 3 chronic kidney disease (HCC)   History of COVID-19   Asthma   Hypertension   Hypertensive urgency   Hypokalemia   SBO -Try clear liquids this morning, but patient does not appear to be tolerating -Keep n.p.o. except meds and ice chips -Appreciate general surgery evaluation  Hypertensive urgency in setting of uncontrolled chronic hypertension -Patient noted to be positive for cocaine which is likely causing the elevation -Okay to use IV hydralazine and IV labetalol instead of metoprolol due to combined alpha and beta adrenergic blockade -Continue monitor carefully and resume home amlodipine if tolerated -Hold home metoprolol and lisinopril due to use of  cocaine and AKI respectively  AKI on CKD stage III -Currently with improving trend -Maintain on IV fluid as ordered -Check urine electrolytes -Holding home lisinopril  Hypokalemia-improving -Continue supplementation with goal potassium of 4.0 -Follow a.m. labs  History of asthma with prior COVID-19 -Breathing treatments as needed  - no acute bronchospasms currently  DVT prophylaxis: SCDs Code Status: Full code Family Communication: None at bedside Disposition Plan: Continue monitoring for resolution of SBO and blood pressure control. Status is: Inpatient  Remains inpatient appropriate because:Ongoing diagnostic testing needed not appropriate for outpatient work up, IV treatments appropriate due to intensity of illness or inability to take PO and Inpatient level of care appropriate due to severity of illness   Dispo: The patient is from: Home              Anticipated d/c is to: Home              Anticipated d/c date is: 2 days             Patient currently is not medically stable to d/c.  Consultants:   General Surgery  Procedures:   See below  Antimicrobials:  Anti-infectives (From admission, onward)   None       Subjective: Patient seen and evaluated today with some ongoing generalized abdominal pain and distention.  He is having some nausea and vomiting especially with clear liquid diet.  He states he has passed flatus and has had bowel movement.  His blood pressures remain quite elevated.  Objective: Vitals:   12/22/19 0942 12/22/19 0952 12/22/19 1055  12/22/19 1200  BP: (!) 196/122 (!) 169/113 (!) 180/112 (!) 168/110  Pulse: 64  72 96  Resp: 18  16   Temp:      TempSrc:      SpO2: 97%  98%   Weight:      Height:        Intake/Output Summary (Last 24 hours) at 12/22/2019 1236 Last data filed at 12/22/2019 0900 Gross per 24 hour  Intake 1119 ml  Output 300 ml  Net 819 ml   Filed Weights   12/21/19 0946  Weight: 104.3 kg     Examination:  General exam: Appears calm and comfortable  Respiratory system: Clear to auscultation. Respiratory effort normal. Cardiovascular system: S1 & S2 heard, RRR. No JVD, murmurs, rubs, gallops or clicks. No pedal edema. Gastrointestinal system: Abdomen is distended, soft and nontender. No organomegaly or masses felt.  Minimal bowel sounds noted. Central nervous system: Alert and oriented. No focal neurological deficits. Extremities: Symmetric 5 x 5 power. Skin: No rashes, lesions or ulcers Psychiatry: Judgement and insight appear normal. Mood & affect appropriate.     Data Reviewed: I have personally reviewed following labs and imaging studies  CBC: Recent Labs  Lab 12/21/19 1011 12/22/19 0646  WBC 12.5* 20.5*  NEUTROABS 10.0*  --   HGB 15.4 17.0  HCT 47.3 53.6*  MCV 96.7 100.6*  PLT 469* 295*   Basic Metabolic Panel: Recent Labs  Lab 12/21/19 1011 12/22/19 0704  NA 139 141  K 3.1* 3.5  CL 96* 96*  CO2 29 27  GLUCOSE 168* 134*  BUN 24* 29*  CREATININE 2.43* 2.19*  CALCIUM 10.2 9.6  MG 2.2 2.2   GFR: Estimated Creatinine Clearance: 48.5 mL/min (A) (by C-G formula based on SCr of 2.19 mg/dL (H)). Liver Function Tests: Recent Labs  Lab 12/21/19 1011 12/22/19 0704  AST 29 24  ALT 24 18  ALKPHOS 96 98  BILITOT 0.7 0.6  PROT 8.7* 8.3*  ALBUMIN 4.5 4.0   Recent Labs  Lab 12/21/19 1011  LIPASE 17   No results for input(s): AMMONIA in the last 168 hours. Coagulation Profile: No results for input(s): INR, PROTIME in the last 168 hours. Cardiac Enzymes: No results for input(s): CKTOTAL, CKMB, CKMBINDEX, TROPONINI in the last 168 hours. BNP (last 3 results) No results for input(s): PROBNP in the last 8760 hours. HbA1C: No results for input(s): HGBA1C in the last 72 hours. CBG: No results for input(s): GLUCAP in the last 168 hours. Lipid Profile: No results for input(s): CHOL, HDL, LDLCALC, TRIG, CHOLHDL, LDLDIRECT in the last 72  hours. Thyroid Function Tests: No results for input(s): TSH, T4TOTAL, FREET4, T3FREE, THYROIDAB in the last 72 hours. Anemia Panel: No results for input(s): VITAMINB12, FOLATE, FERRITIN, TIBC, IRON, RETICCTPCT in the last 72 hours. Sepsis Labs: No results for input(s): PROCALCITON, LATICACIDVEN in the last 168 hours.  Recent Results (from the past 240 hour(s))  SARS Coronavirus 2 by RT PCR (hospital order, performed in [facility] hospital lab) Nasopharyngeal Nasopharyngeal Swab     Status: None   Collection Time: 12/21/19  4:05 PM   Specimen: Nasopharyngeal Swab  Result Value Ref Range Status   SARS Coronavirus 2 NEGATIVE NEGATIVE Final    Comment: (NOTE) SARS-CoV-2 target nucleic acids are NOT DETECTED.  The SARS-CoV-2 RNA is generally detectable in upper and lower respiratory specimens during the acute phase of infection. The lowest concentration of SARS-CoV-2 viral copies this assay can detect is 250 copies / mL. A  negative result does not preclude SARS-CoV-2 infection and should not be used as the sole basis for treatment or other patient management decisions.  A negative result may occur with improper specimen collection / handling, submission of specimen other than nasopharyngeal swab, presence of viral mutation(s) within the areas targeted by this assay, and inadequate number of viral copies (<250 copies / mL). A negative result must be combined with clinical observations, patient history, and epidemiological information.  Fact Sheet for Patients:   StrictlyIdeas.no  Fact Sheet for Healthcare Providers: BankingDealers.co.za  This test is not yet approved or  cleared by the [place] FDA and has been authorized for detection and/or diagnosis of SARS-CoV-2 by FDA under an Emergency Use Authorization (EUA).  This EUA will remain in effect (meaning this test can be used) for the duration of the COVID-19 declaration under  Section 564(b)(1) of the Act, 21 U.S.C. section 360bbb-3(b)(1), unless the authorization is terminated or revoked sooner.  Performed at [facility], [address], [place], [place] [address]          Radiology Studies: CT Angio Chest/Abd/Pel for Dissection W and/or Wo Contrast  Result Date: 12/21/2019 CLINICAL DATA:  Tearing chest and abdominal pain EXAM: CT ANGIOGRAPHY CHEST, ABDOMEN AND PELVIS TECHNIQUE: Multidetector CT imaging through the chest, abdomen and pelvis was performed using the standard protocol during bolus administration of intravenous contrast. Multiplanar reconstructed images and MIPs were obtained and reviewed to evaluate the vascular anatomy. CONTRAST:  44mL OMNIPAQUE IOHEXOL 350 MG/ML SOLN after 4 hour protocol prep for contrast allergy COMPARISON:  06/05/2016 FINDINGS: CTA CHEST FINDINGS Cardiovascular: Heart size upper limits normal. No pericardial effusion. The RV is nondilated. Fair contrast opacification pulmonary arterial tree; the exam was not optimized for detection of pulmonary emboli. There is good contrast opacification of the thoracic aorta. No aneurysm. No dissection. No stenosis. Classic 3 vessel brachiocephalic arterial origin anatomy without proximal stenosis. No significant atheromatous change. Mediastinum/Nodes: Small hiatal hernia. No hilar or mediastinal adenopathy. Lungs/Pleura: No pleural effusion. No pneumothorax. Linear atelectasis or scarring in the lung bases. Musculoskeletal: No chest wall abnormality. No acute or significant osseous findings. Review of the MIP images confirms the above findings. CTA ABDOMEN AND PELVIS FINDINGS VASCULAR Aorta: Normal caliber aorta without aneurysm, dissection, vasculitis or significant stenosis. Celiac: Patent without evidence of aneurysm, dissection, vasculitis or significant stenosis. SMA: Patent without evidence of aneurysm, dissection, vasculitis or significant stenosis. Renals: Both renal arteries are patent  without evidence of aneurysm, dissection, vasculitis, fibromuscular dysplasia or significant stenosis. IMA: Patent without evidence of aneurysm, dissection, vasculitis or significant stenosis. Inflow: Minimal plaque at the aortic bifurcation. No dissection, aneurysm, or stenosis. Veins: No obvious venous abnormality within the limitations of this arterial phase study. Review of the MIP images confirms the above findings. NON-VASCULAR Hepatobiliary: No focal liver abnormality is seen. No gallstones, gallbladder wall thickening, or biliary dilatation. Pancreas: Unremarkable. No pancreatic ductal dilatation or surrounding inflammatory changes. Spleen: Normal in size without focal abnormality. Adrenals/Urinary Tract: Adrenal glands unremarkable. Kidneys enhance symmetrically without hydronephrosis. Urinary bladder physiologically distended. Stomach/Bowel: The stomach is decompressed. Duodenum unremarkable. There are multiple dilated mid abdominal small bowel loops. There is some edema in the mesentery on the right. Abrupt transition to decompressed distal small bowel just inferior to the umbilicus to the right of midline, without associated mass, abscess, or other pathologic finding. Colon is nondilated, with a few distal descending and sigmoid diverticula, without adjacent inflammatory change or abscess. Lymphatic: No abdominal or pelvic adenopathy. Reproductive: Prostate  is unremarkable. Other: Bilateral pelvic phleboliths. Small volume pelvic and perihepatic ascites. No free air. Musculoskeletal: Spondylitic changes in the lower lumbar spine. No fracture or worrisome bone lesion. Review of the MIP images confirms the above findings. IMPRESSION: 1. Negative for aortic dissection or other acute vascular findings. 2. Mid/distal high-grade small bowel obstruction without evident etiology, suggesting adhesions. 3. Small volume pelvic and perihepatic ascites. 4. Descending and sigmoid diverticulosis. 5. Small hiatal  hernia. Electronically Signed   By: Lucrezia Europe M.D.   On: 12/21/2019 14:27        Scheduled Meds: . amLODipine  10 mg Oral Daily  . pantoprazole (PROTONIX) IV  40 mg Intravenous Q12H   Continuous Infusions: . 0.9 % NaCl with KCl 40 mEq / L 100 mL/hr at 12/22/19 0506  . lactated ringers       LOS: 1 day    Time spent: 36 minutes    Ethan Maya Darleen Crocker, DO [facility]  If 7PM-7AM, please contact night-coverage [email or web address] 12/22/2019, 12:36 PM

## 2019-12-22 NOTE — Consult Note (Addendum)
Reason for Consult: Small bowel obstruction Referring Physician: Dr. Mannie Stabile is an 59 y.o. male.  HPI: Patient is a 59 year old black male who presented to the emergency room yesterday afternoon with a 24-hour history of worsening mid abdominal pain and distention.  He also had nausea and vomiting.  A CT scan was performed which revealed a small bowel obstruction in the midportion of the abdomen.  He has never had surgery.  He was admitted to the hospital for further evaluation and treatment.  He was also noted to have dehydration and had a positive urine test for cocaine, marijuana, and opiates.  Since his admission, he has had significant hypertension.  This is being addressed by the hospitalist.  He has had minimal urine output.  He has had no nausea or vomiting.  His abdominal pain has slightly decreased, though he still feels distended.  Past Medical History:  Diagnosis Date  . Arthritis   . Asthma   . Chronic kidney disease   . Hypertension     Past Surgical History:  Procedure Laterality Date  . APPENDECTOMY      Family History  Problem Relation Age of Onset  . Heart attack Mother   . Hypertension Mother   . Heart attack Father   . Hypertension Sister   . Diabetes Sister   . Arthritis Sister   . Hypertension Brother   . Kidney disease Brother   . Diabetes Brother   . Heart disease Brother   . Arthritis Brother   . Hypertension Brother   . Cancer Brother        colon cancer  . Arthritis Brother   . Hypertension Brother   . Arthritis Brother   . Hypertension Brother   . Arthritis Brother   . Hypertension Sister   . Diabetes Sister   . Arthritis Sister   . Hypertension Sister   . Diabetes Sister   . Arthritis Sister   . Hypertension Sister   . Diabetes Sister   . Arthritis Sister   . Hypertension Sister   . Diabetes Sister   . Arthritis Sister   . Hypertension Sister   . Diabetes Sister   . Arthritis Sister   . Hypertension Sister   . Arthritis  Sister   . Hypertension Sister   . Heart attack Sister   . Arthritis Sister     Social History:  reports that he has never smoked. He has never used smokeless tobacco. He reports current alcohol use. He reports current drug use. Drugs: Marijuana and Cocaine.  Allergies:  Allergies  Allergen Reactions  . Shellfish Allergy Anaphylaxis  . Omnipaque [Iohexol] Hives and Swelling    Pt. Did fine with 1 hour pre medication.    Medications:  Scheduled: . pantoprazole (PROTONIX) IV  40 mg Intravenous Q12H    Results for orders placed or performed during the hospital encounter of 12/21/19 (from the past 48 hour(s))  Comprehensive metabolic panel     Status: Abnormal   Collection Time: 12/21/19 10:11 AM  Result Value Ref Range   Sodium 139 135 - 145 mmol/L   Potassium 3.1 (L) 3.5 - 5.1 mmol/L   Chloride 96 (L) 98 - 111 mmol/L   CO2 29 22 - 32 mmol/L   Glucose, Bld 168 (H) 70 - 99 mg/dL    Comment: Glucose reference range applies only to samples taken after fasting for at least 8 hours.   BUN 24 (H) 6 - 20 mg/dL   Creatinine,  Ser 2.43 (H) 0.61 - 1.24 mg/dL   Calcium 10.2 8.9 - 10.3 mg/dL   Total Protein 8.7 (H) 6.5 - 8.1 g/dL   Albumin 4.5 3.5 - 5.0 g/dL   AST 29 15 - 41 U/L   ALT 24 0 - 44 U/L   Alkaline Phosphatase 96 38 - 126 U/L   Total Bilirubin 0.7 0.3 - 1.2 mg/dL   GFR calc non Af Amer 29 (L) >60 mL/min   GFR calc Af Amer 33 (L) >60 mL/min   Anion gap 14 5 - 15    Comment: Performed at [facility], [address], [place], [place] [address]  CBC with Differential     Status: Abnormal   Collection Time: 12/21/19 10:11 AM  Result Value Ref Range   WBC 12.5 (H) 4.0 - 10.5 K/uL   RBC 4.89 4.22 - 5.81 MIL/uL   Hemoglobin 15.4 13.0 - 17.0 g/dL   HCT 47.3 39 - 52 %   MCV 96.7 80.0 - 100.0 fL   MCH 31.5 26.0 - 34.0 pg   MCHC 32.6 30.0 - 36.0 g/dL   RDW 14.1 11.5 - 15.5 %   Platelets 469 (H) 150 - 400 K/uL   nRBC 0.0 0.0 - 0.2 %   Neutrophils Relative % 80 %   Neutro Abs  10.0 (H) 1.7 - 7.7 K/uL   Lymphocytes Relative 14 %   Lymphs Abs 1.8 0.7 - 4.0 K/uL   Monocytes Relative 4 %   Monocytes Absolute 0.6 0 - 1 K/uL   Eosinophils Relative 1 %   Eosinophils Absolute 0.1 0 - 0 K/uL   Basophils Relative 1 %   Basophils Absolute 0.1 0 - 0 K/uL   Immature Granulocytes 0 %   Abs Immature Granulocytes 0.05 0.00 - 0.07 K/uL    Comment: Performed at [facility], [address], [place], [place] [address]  Troponin I (High Sensitivity)     Status: None   Collection Time: 12/21/19 10:11 AM  Result Value Ref Range   Troponin I (High Sensitivity) 12 <18 ng/L    Comment: (NOTE) Elevated high sensitivity troponin I (hsTnI) values and significant  changes across serial measurements may suggest ACS but many other  chronic and acute conditions are known to elevate hsTnI results.  Refer to the "Links" section for chest pain algorithms and additional  guidance. Performed at [facility], [address] [place], [place] [address]   Lipase, blood     Status: None   Collection Time: 12/21/19 10:11 AM  Result Value Ref Range   Lipase 17 11 - 51 U/L    Comment: Performed at [facility], [address] [place], [place] [address]  Magnesium     Status: None   Collection Time: 12/21/19 10:11 AM  Result Value Ref Range   Magnesium 2.2 1.7 - 2.4 mg/dL    Comment: Performed at [facility], [address] [place], [place] [address]  Troponin I (High Sensitivity)     Status: None   Collection Time: 12/21/19 12:05 PM  Result Value Ref Range   Troponin I (High Sensitivity) 10 <18 ng/L    Comment: (NOTE) Elevated high sensitivity troponin I (hsTnI) values and significant  changes across serial measurements may suggest ACS but many other  chronic and acute conditions are known to elevate hsTnI results.  Refer to the "Links" section for chest pain algorithms and additional  guidance. Performed at [facility], [address] [place], [place] [address]  SARS Coronavirus 2 by  RT PCR (hospital order, performed in [facility] [place] [facility] hospital lab) Nasopharyngeal Nasopharyngeal Swab     Status: None   Collection Time: 12/21/19  4:05 PM   Specimen: Nasopharyngeal Swab  Result Value Ref Range   SARS Coronavirus 2 NEGATIVE NEGATIVE    Comment: (NOTE) SARS-CoV-2 target nucleic acids are NOT DETECTED.  The SARS-CoV-2 RNA is generally detectable in upper and lower respiratory specimens during the acute phase of infection. The lowest concentration of SARS-CoV-2 viral copies this assay can detect is 250 copies / mL. A negative result does not preclude SARS-CoV-2 infection and should not be used as the sole basis for treatment or other patient management decisions.  A negative result may occur with improper specimen collection / handling, submission of specimen other than nasopharyngeal swab, presence of viral mutation(s) within the areas targeted by this assay, and inadequate number of viral copies (<250 copies / mL). A negative result must be combined with clinical observations, patient history, and epidemiological information.  Fact Sheet for Patients:   StrictlyIdeas.no  Fact Sheet for Healthcare Providers: BankingDealers.co.za  This test is not yet approved or  cleared by the [place] FDA and has been authorized for detection and/or diagnosis of SARS-CoV-2 by FDA under an Emergency Use Authorization (EUA).  This EUA will remain in effect (meaning this test can be used) for the duration of the COVID-19 declaration under Section 564(b)(1) of the Act, 21 U.S.C. section 360bbb-3(b)(1), unless the authorization is terminated or revoked sooner.  Performed at [facility], [address] [place], [place] [address]   CBC     Status: Abnormal   Collection Time: 12/22/19  6:46 AM  Result Value Ref Range   WBC 20.5 (H) 4.0 - 10.5 K/uL   RBC 5.33 4.22 - 5.81 MIL/uL   Hemoglobin 17.0 13.0 - 17.0 g/dL   HCT 53.6 (H) 39 - 52  %   MCV 100.6 (H) 80.0 - 100.0 fL   MCH 31.9 26.0 - 34.0 pg   MCHC 31.7 30.0 - 36.0 g/dL   RDW 14.8 11.5 - 15.5 %   Platelets 477 (H) 150 - 400 K/uL   nRBC 0.0 0.0 - 0.2 %    Comment: Performed at [facility], [address], [place],  [address]  Comprehensive metabolic panel     Status: Abnormal   Collection Time: 12/22/19  7:04 AM  Result Value Ref Range   Sodium 141 135 - 145 mmol/L   Potassium 3.5 3.5 - 5.1 mmol/L   Chloride 96 (L) 98 - 111 mmol/L   CO2 27 22 - 32 mmol/L   Glucose, Bld 134 (H) 70 - 99 mg/dL    Comment: Glucose reference range applies only to samples taken after fasting for at least 8 hours.   BUN 29 (H) 6 - 20 mg/dL   Creatinine, Ser 2.19 (H) 0.61 - 1.24 mg/dL   Calcium 9.6 8.9 - 10.3 mg/dL   Total Protein 8.3 (H) 6.5 - 8.1 g/dL   Albumin 4.0 3.5 - 5.0 g/dL   AST 24 15 - 41 U/L   ALT 18 0 - 44 U/L   Alkaline Phosphatase 98 38 - 126 U/L   Total Bilirubin 0.6 0.3 - 1.2 mg/dL   GFR calc non Af Amer 32 (L) >60 mL/min   GFR calc Af Amer 38 (L) >60 mL/min   Anion gap 18 (H) 5 - 15    Comment: Performed at [facility], [address]  [place], [place] [address]  Magnesium     Status: None   Collection Time: 12/22/19  7:04 AM  Result Value Ref Range   Magnesium 2.2 1.7 - 2.4 mg/dL    Comment: Performed at [facility], [address] [place], [place] [address]  Urinalysis, Routine w reflex microscopic     Status: Abnormal   Collection Time: 12/22/19  7:40 AM  Result Value Ref Range   Color, Urine YELLOW (A) YELLOW    Comment: BIOCHEMICALS MAY BE AFFECTED BY COLOR CORRECTED ON 06/20 AT 0845: PREVIOUSLY REPORTED AS YELLOW    APPearance CLEAR CLEAR   Specific Gravity, Urine 1.025 1.005 - 1.030   pH 6.0 5.0 - 8.0   Glucose, UA NEGATIVE NEGATIVE mg/dL   Hgb urine dipstick NEGATIVE NEGATIVE   Bilirubin Urine NEGATIVE NEGATIVE   Ketones, ur NEGATIVE NEGATIVE mg/dL   Protein, ur 100 (A) NEGATIVE mg/dL   Nitrite NEGATIVE NEGATIVE   Leukocytes,Ua NEGATIVE  NEGATIVE    Comment: Performed at [facility], [address] [place], [place] [address]  Sodium, urine, random     Status: None   Collection Time: 12/22/19  7:40 AM  Result Value Ref Range   Sodium, Ur 15 mmol/L    Comment: Performed at [facility], [address], [place], [place] [address]  Creatinine, urine, random     Status: None   Collection Time: 12/22/19  7:40 AM  Result Value Ref Range   Creatinine, Urine 332.05 mg/dL    Comment: Performed at [facility], [address], [place], [place] [address]  Urine rapid drug screen (hosp performed)     Status: Abnormal   Collection Time: 12/22/19  7:40 AM  Result Value Ref Range   Opiates POSITIVE (A) NONE DETECTED   Cocaine POSITIVE (A) NONE DETECTED   Benzodiazepines NONE DETECTED NONE DETECTED   Amphetamines NONE DETECTED NONE DETECTED   Tetrahydrocannabinol POSITIVE (A) NONE DETECTED   Barbiturates NONE DETECTED NONE DETECTED    Comment: (NOTE) DRUG SCREEN FOR MEDICAL PURPOSES ONLY.  IF CONFIRMATION IS NEEDED FOR ANY PURPOSE, NOTIFY LAB WITHIN 5 DAYS.  LOWEST DETECTABLE LIMITS FOR URINE DRUG SCREEN Drug Class                     Cutoff (ng/mL) Amphetamine and metabolites    1000 Barbiturate and metabolites    200 Benzodiazepine                 944 Tricyclics and metabolites     300 Opiates and metabolites        300 Cocaine and metabolites        300 THC                            50 Performed at [facility], [address], [place], [place] [address]   Urinalysis, Microscopic (reflex)     Status: None   Collection Time: 12/22/19  7:40 AM  Result Value Ref Range   RBC / HPF 0-5 0 - 5 RBC/hpf   WBC, UA NONE SEEN 0 - 5 WBC/hpf   Bacteria, UA NONE SEEN NONE SEEN   Squamous Epithelial / LPF NONE SEEN 0 - 5    Comment: Performed at [facility], [address], [place], [place] [address]    CT Angio Chest/Abd/Pel for Dissection W and/or Wo Contrast  Result Date: 12/21/2019 CLINICAL DATA:  Tearing chest and abdominal  pain EXAM: CT  ANGIOGRAPHY CHEST, ABDOMEN AND PELVIS TECHNIQUE: Multidetector CT imaging through the chest, abdomen and pelvis was performed using the standard protocol during bolus administration of intravenous contrast. Multiplanar reconstructed images and MIPs were obtained and reviewed to evaluate the vascular anatomy. CONTRAST:  43mL OMNIPAQUE IOHEXOL 350 MG/ML SOLN after 4 hour protocol prep for contrast allergy COMPARISON:  06/05/2016 FINDINGS: CTA CHEST FINDINGS Cardiovascular: Heart size upper limits normal. No pericardial effusion. The RV is nondilated. Fair contrast opacification pulmonary arterial tree; the exam was not optimized for detection of pulmonary emboli. There is good contrast opacification of the thoracic aorta. No aneurysm. No dissection. No stenosis. Classic 3 vessel brachiocephalic arterial origin anatomy without proximal stenosis. No significant atheromatous change. Mediastinum/Nodes: Small hiatal hernia. No hilar or mediastinal adenopathy. Lungs/Pleura: No pleural effusion. No pneumothorax. Linear atelectasis or scarring in the lung bases. Musculoskeletal: No chest wall abnormality. No acute or significant osseous findings. Review of the MIP images confirms the above findings. CTA ABDOMEN AND PELVIS FINDINGS VASCULAR Aorta: Normal caliber aorta without aneurysm, dissection, vasculitis or significant stenosis. Celiac: Patent without evidence of aneurysm, dissection, vasculitis or significant stenosis. SMA: Patent without evidence of aneurysm, dissection, vasculitis or significant stenosis. Renals: Both renal arteries are patent without evidence of aneurysm, dissection, vasculitis, fibromuscular dysplasia or significant stenosis. IMA: Patent without evidence of aneurysm, dissection, vasculitis or significant stenosis. Inflow: Minimal plaque at the aortic bifurcation. No dissection, aneurysm, or stenosis. Veins: No obvious venous abnormality within the limitations of this arterial phase  study. Review of the MIP images confirms the above findings. NON-VASCULAR Hepatobiliary: No focal liver abnormality is seen. No gallstones, gallbladder wall thickening, or biliary dilatation. Pancreas: Unremarkable. No pancreatic ductal dilatation or surrounding inflammatory changes. Spleen: Normal in size without focal abnormality. Adrenals/Urinary Tract: Adrenal glands unremarkable. Kidneys enhance symmetrically without hydronephrosis. Urinary bladder physiologically distended. Stomach/Bowel: The stomach is decompressed. Duodenum unremarkable. There are multiple dilated mid abdominal small bowel loops. There is some edema in the mesentery on the right. Abrupt transition to decompressed distal small bowel just inferior to the umbilicus to the right of midline, without associated mass, abscess, or other pathologic finding. Colon is nondilated, with a few distal descending and sigmoid diverticula, without adjacent inflammatory change or abscess. Lymphatic: No abdominal or pelvic adenopathy. Reproductive: Prostate is unremarkable. Other: Bilateral pelvic phleboliths. Small volume pelvic and perihepatic ascites. No free air. Musculoskeletal: Spondylitic changes in the lower lumbar spine. No fracture or worrisome bone lesion. Review of the MIP images confirms the above findings. IMPRESSION: 1. Negative for aortic dissection or other acute vascular findings. 2. Mid/distal high-grade small bowel obstruction without evident etiology, suggesting adhesions. 3. Small volume pelvic and perihepatic ascites. 4. Descending and sigmoid diverticulosis. 5. Small hiatal hernia. Electronically Signed   By: Lucrezia Europe M.D.   On: 12/21/2019 14:27    ROS:  Pertinent items are noted in HPI.  Blood pressure (!) 196/122, pulse 64, temperature 98.6 F (37 C), temperature source Oral, resp. rate 18, height 6\' 2"  (1.88 m), weight 104.3 kg, SpO2 97 %. Physical Exam: Pleasant black male no acute distress Head is normocephalic,  atraumatic Lungs clear to auscultation with good breath sounds bilaterally Heart examination reveals a regular rate and rhythm without S3, S4, murmurs Abdomen is distended but soft.  No rigidity is noted.  He has nonspecific discomfort to palpation in the periumbilical region.  Minimal bowel sounds appreciated.  CT scan images personally reviewed  Assessment/Plan: Impression: Small bowel obstruction of unknown etiology. Renal insufficiency, illicit drug use,  hypertension Plan: We will give fluid bolus for decreased urine output.  Unable to get small bowel follow-through due to oral contrast allergy.  Will follow patient clinically.  Surgical intervention if needed would be complicated with his cocaine use.  Hopefully we can avoid surgical intervention.  Discussed with Dr. Manuella Ghazi.  Aviva Signs 12/22/2019, 9:46 AM

## 2019-12-22 NOTE — Progress Notes (Signed)
Patient has not voided this shift.  Patient bladder scanned and had less than 150cc urine in bladder.  Patient has been in severe pain during shift, and has tried multiple times to use the bathroom. Patient's abdominal girth has not changed in shift.  Patient has not actively vomited in shift.  Patient blood pressure has remained elevated all during shift.

## 2019-12-22 NOTE — Progress Notes (Signed)
Notified MD of patient not voiding this shift. Informed MD of patient pain and elevated blood pressure.  No new orders received at this time.

## 2019-12-22 NOTE — Progress Notes (Signed)
MD changed diet order to clear liquids, pt given apple juice and advised to drink slowly as to not overload his stomach and cause n/v. Stated understanding. B/P 196/122, prn Hydralazine given per order. Pt denies c/o at present.

## 2019-12-22 NOTE — Progress Notes (Signed)
Pt sleeping at present. B/P 157/91, no antihypertensives indicated at this time.

## 2019-12-23 LAB — CBC
HCT: 46.7 % (ref 39.0–52.0)
Hemoglobin: 14.5 g/dL (ref 13.0–17.0)
MCH: 31.4 pg (ref 26.0–34.0)
MCHC: 31 g/dL (ref 30.0–36.0)
MCV: 101.1 fL — ABNORMAL HIGH (ref 80.0–100.0)
Platelets: 377 10*3/uL (ref 150–400)
RBC: 4.62 MIL/uL (ref 4.22–5.81)
RDW: 14.7 % (ref 11.5–15.5)
WBC: 14.2 10*3/uL — ABNORMAL HIGH (ref 4.0–10.5)
nRBC: 0 % (ref 0.0–0.2)

## 2019-12-23 LAB — COMPREHENSIVE METABOLIC PANEL
ALT: 18 U/L (ref 0–44)
AST: 27 U/L (ref 15–41)
Albumin: 3.7 g/dL (ref 3.5–5.0)
Alkaline Phosphatase: 75 U/L (ref 38–126)
Anion gap: 12 (ref 5–15)
BUN: 23 mg/dL — ABNORMAL HIGH (ref 6–20)
CO2: 26 mmol/L (ref 22–32)
Calcium: 8.8 mg/dL — ABNORMAL LOW (ref 8.9–10.3)
Chloride: 103 mmol/L (ref 98–111)
Creatinine, Ser: 1.62 mg/dL — ABNORMAL HIGH (ref 0.61–1.24)
GFR calc Af Amer: 54 mL/min — ABNORMAL LOW (ref >60)
GFR calc non Af Amer: 47 mL/min — ABNORMAL LOW (ref >60)
Glucose, Bld: 102 mg/dL — ABNORMAL HIGH (ref 70–99)
Potassium: 3.8 mmol/L (ref 3.5–5.1)
Sodium: 141 mmol/L (ref 135–145)
Total Bilirubin: 0.8 mg/dL (ref 0.3–1.2)
Total Protein: 7.2 g/dL (ref 6.5–8.1)

## 2019-12-23 LAB — PHOSPHORUS: Phosphorus: 2.5 mg/dL (ref 2.5–4.6)

## 2019-12-23 LAB — MAGNESIUM: Magnesium: 2.1 mg/dL (ref 1.7–2.4)

## 2019-12-23 MED ORDER — BISACODYL 10 MG RE SUPP
10.0000 mg | Freq: Every morning | RECTAL | Status: DC
  Administered 2019-12-23 – 2019-12-25 (×2): 10 mg via RECTAL
  Filled 2019-12-23 (×5): qty 1

## 2019-12-23 MED ORDER — LABETALOL HCL 5 MG/ML IV SOLN
20.0000 mg | INTRAVENOUS | Status: DC | PRN
  Administered 2019-12-23 – 2019-12-29 (×5): 20 mg via INTRAVENOUS
  Filled 2019-12-23 (×5): qty 4

## 2019-12-23 MED ORDER — PROMETHAZINE HCL 25 MG/ML IJ SOLN
25.0000 mg | Freq: Once | INTRAMUSCULAR | Status: AC
  Administered 2019-12-23: 25 mg via INTRAVENOUS
  Filled 2019-12-23: qty 1

## 2019-12-23 MED ORDER — METOPROLOL TARTRATE 5 MG/5ML IV SOLN
5.0000 mg | Freq: Four times a day (QID) | INTRAVENOUS | Status: DC
  Administered 2019-12-23 – 2019-12-24 (×5): 5 mg via INTRAVENOUS
  Filled 2019-12-23 (×6): qty 5

## 2019-12-23 NOTE — Progress Notes (Signed)
Subjective: Patient denies any bowel movement or flatus yet.  Did have emesis when he was started on clear liquid diet.  He is back to ice chips only.  He states his abdominal pain is about the same as it was yesterday.  Objective: Vital signs in last 24 hours: Temp:  [98.6 F (37 C)-99.2 F (37.3 C)] 99.2 F (37.3 C) (06/21 0511) Pulse Rate:  [65-96] 65 (06/21 0721) Resp:  [16] 16 (06/21 0511) BP: (155-180)/(91-132) 160/99 (06/21 0924) SpO2:  [96 %-98 %] 96 % (06/21 0511) Last BM Date: 12/21/19  Intake/Output from previous day: 06/20 0701 - 06/21 0700 In: 2940.2 [P.O.:120; I.V.:2820.2] Out: 500 [Urine:500] Intake/Output this shift: No intake/output data recorded.  General appearance: alert, cooperative and no distress GI: Soft with mild distention but it is not tense.  Occasional bowel sounds appreciated.  Tender to palpation around the periumbilical region.  No rigidity is noted.  Lab Results:  Recent Labs    12/22/19 0646 12/23/19 0602  WBC 20.5* 14.2*  HGB 17.0 14.5  HCT 53.6* 46.7  PLT 477* 377   BMET Recent Labs    12/22/19 0704 12/23/19 0602  NA 141 141  K 3.5 3.8  CL 96* 103  CO2 27 26  GLUCOSE 134* 102*  BUN 29* 23*  CREATININE 2.19* 1.62*  CALCIUM 9.6 8.8*   PT/INR No results for input(s): LABPROT, INR in the last 72 hours.  Studies/Results: CT Angio Chest/Abd/Pel for Dissection W and/or Wo Contrast  Result Date: 12/21/2019 CLINICAL DATA:  Tearing chest and abdominal pain EXAM: CT ANGIOGRAPHY CHEST, ABDOMEN AND PELVIS TECHNIQUE: Multidetector CT imaging through the chest, abdomen and pelvis was performed using the standard protocol during bolus administration of intravenous contrast. Multiplanar reconstructed images and MIPs were obtained and reviewed to evaluate the vascular anatomy. CONTRAST:  10mL OMNIPAQUE IOHEXOL 350 MG/ML SOLN after 4 hour protocol prep for contrast allergy COMPARISON:  06/05/2016 FINDINGS: CTA CHEST FINDINGS Cardiovascular:  Heart size upper limits normal. No pericardial effusion. The RV is nondilated. Fair contrast opacification pulmonary arterial tree; the exam was not optimized for detection of pulmonary emboli. There is good contrast opacification of the thoracic aorta. No aneurysm. No dissection. No stenosis. Classic 3 vessel brachiocephalic arterial origin anatomy without proximal stenosis. No significant atheromatous change. Mediastinum/Nodes: Small hiatal hernia. No hilar or mediastinal adenopathy. Lungs/Pleura: No pleural effusion. No pneumothorax. Linear atelectasis or scarring in the lung bases. Musculoskeletal: No chest wall abnormality. No acute or significant osseous findings. Review of the MIP images confirms the above findings. CTA ABDOMEN AND PELVIS FINDINGS VASCULAR Aorta: Normal caliber aorta without aneurysm, dissection, vasculitis or significant stenosis. Celiac: Patent without evidence of aneurysm, dissection, vasculitis or significant stenosis. SMA: Patent without evidence of aneurysm, dissection, vasculitis or significant stenosis. Renals: Both renal arteries are patent without evidence of aneurysm, dissection, vasculitis, fibromuscular dysplasia or significant stenosis. IMA: Patent without evidence of aneurysm, dissection, vasculitis or significant stenosis. Inflow: Minimal plaque at the aortic bifurcation. No dissection, aneurysm, or stenosis. Veins: No obvious venous abnormality within the limitations of this arterial phase study. Review of the MIP images confirms the above findings. NON-VASCULAR Hepatobiliary: No focal liver abnormality is seen. No gallstones, gallbladder wall thickening, or biliary dilatation. Pancreas: Unremarkable. No pancreatic ductal dilatation or surrounding inflammatory changes. Spleen: Normal in size without focal abnormality. Adrenals/Urinary Tract: Adrenal glands unremarkable. Kidneys enhance symmetrically without hydronephrosis. Urinary bladder physiologically distended.  Stomach/Bowel: The stomach is decompressed. Duodenum unremarkable. There are multiple dilated mid abdominal small bowel loops.  There is some edema in the mesentery on the right. Abrupt transition to decompressed distal small bowel just inferior to the umbilicus to the right of midline, without associated mass, abscess, or other pathologic finding. Colon is nondilated, with a few distal descending and sigmoid diverticula, without adjacent inflammatory change or abscess. Lymphatic: No abdominal or pelvic adenopathy. Reproductive: Prostate is unremarkable. Other: Bilateral pelvic phleboliths. Small volume pelvic and perihepatic ascites. No free air. Musculoskeletal: Spondylitic changes in the lower lumbar spine. No fracture or worrisome bone lesion. Review of the MIP images confirms the above findings. IMPRESSION: 1. Negative for aortic dissection or other acute vascular findings. 2. Mid/distal high-grade small bowel obstruction without evident etiology, suggesting adhesions. 3. Small volume pelvic and perihepatic ascites. 4. Descending and sigmoid diverticulosis. 5. Small hiatal hernia. Electronically Signed   By: Lucrezia Europe M.D.   On: 12/21/2019 14:27    Anti-infectives: Anti-infectives (From admission, onward)   None      Assessment/Plan: Impression: Small bowel obstruction, cocaine use, malignant hypertension.  No significant change from abdominal examination. Plan: We will continue to monitor.  May need surgical exploration should this not resolve.  Obviously have to wait until his blood pressure is under better control.  LOS: 2 days    Aviva Signs 12/23/2019

## 2019-12-23 NOTE — Progress Notes (Signed)
[facility] night shift.  The staff reports that the patient has been vomiting despite being administered Zofran earlier.  He has also been restless and triggering artifact or alarms on the cardiac telemetry.  His blood pressure has been elevated despite getting hydralazine 20 mg IVP earlier.  Phenergan 25 mg IVP x1 ordered.  Since it has been at least over 48 hours since he last consumed cocaine, I will take advantage of the promotility effect of beta-blockers.  I will resume metoprolol IV form since he takes a high dose at home (100 mg p.o. twice daily).  I will discontinue the hydralazine given potential for constipation, along with 3 episodes of nausea and vomiting earlier (not related to hydralazine administration).  I will increase dosage of labetalol to 20 mg IVP every 2 hours and decrease the threshold for administration to > 159/99 mmHg.  Tennis Must, MD

## 2019-12-23 NOTE — Progress Notes (Signed)
PROGRESS NOTE    Ethan Schmidt  GXQ:[id number] DOB: [birth year] DOA: 12/21/2019 PCP: Patient, No Pcp Per   Brief Narrative:  Per HPI: Ethan Schmidt a 59 y.o.malewith a history of uncontrolled hypertension, stage III chronic kidney disease, asthma, arthritis. Patient seen for onset of severe stabbing abdominal pain around and above his umbilicus that started around 3 AM and has been worsening. He attempted to take Pepto-Bismol, which was not effective. He has had some dry heaves earlier today, but has not had anything now. Pain radiates into his back. His pain is improved with IV pain medicine. No other palliating or provoking factors. Denies diarrhea, constipation, fevers, chills. Denies chest pain.  He does have a history of drug use with last cocaine use about a week ago.  Emergency Department Course: CT angiogram of his chest, abdomen, pelvis shows high-grade small bowel obstruction. No evidence of dissection. General surgery was consulted, who recommended admission. Did not feel NG tube was warranted at this time as the patient not currently dry heaving or vomiting.  Assessment & Plan:   Principal Problem:   SBO (small bowel obstruction) (HCC) Active Problems:   Acute renal failure superimposed on stage 3 chronic kidney disease (HCC)   History of COVID-19   Asthma   Hypertension   Hypertensive urgency   Hypokalemia   SBO -Continue n.p.o. status as patient had some nausea and vomiting with clear liquid diet yesterday -Appreciate general surgery ongoing evaluation and may need exploratory laparotomy if not improving -We will need blood pressure control first  Hypertensive urgency in setting of uncontrolled chronic hypertension-slowly improving -Patient noted to be positive for cocaine which is likely causing the elevation -Continue IV labetalol dose increased to 20 mg as needed for blood pressure elevations and maintain on IV metoprolol as scheduled since  patient is 48 hours out from cocaine use -Continue to hold oral medications until this can be tolerated  AKI on CKD stage III-resolved -IV fluid rate decreased for now -Urine output approximately 800 mL over 24 hours -Baseline creatinine approximately 1.6  Hypokalemia-resolved -Continue supplementation with goal potassium of 4.0 -Decreased IV fluid rate -Follow a.m. labs  History of asthma with prior COVID-19 -Breathing treatments as needed  - no acute bronchospasms currently  DVT prophylaxis: SCDs Code Status: Full code Family Communication: None at bedside Disposition Plan: Continue monitoring for resolution of SBO and blood pressure control. Status is: Inpatient  Remains inpatient appropriate because:Ongoing diagnostic testing needed not appropriate for outpatient work up, IV treatments appropriate due to intensity of illness or inability to take PO and Inpatient level of care appropriate due to severity of illness   Dispo: The patient is from: Home  Anticipated d/c is to: Home  Anticipated d/c date is: 2-3 days  Patient currently is not medically stable to d/c.  He may need exploratory laparotomy prior to discharge if he does not improve.  Consultants:   General Surgery  Procedures:   See below  Antimicrobials:   None  Subjective: Patient seen and evaluated today with no bowel movement or flatus noted.  He had some nausea and vomiting with clear liquids yesterday.  He was noted to continue having significant blood pressure elevations overnight requiring dosage increases in the labetalol as well as scheduling of metoprolol.  Blood pressures appear to be improved this morning.  Objective: Vitals:   12/23/19 0511 12/23/19 0653 12/23/19 0721 12/23/19 0924  BP: (!) 173/94 (!) 179/122 (!) 155/95 (!) 160/99  Pulse: 94  65  Resp: 16     Temp: 99.2 F (37.3 C)     TempSrc: Oral     SpO2: 96%     Weight:      Height:         Intake/Output Summary (Last 24 hours) at 12/23/2019 1256 Last data filed at 12/23/2019 0300 Gross per 24 hour  Intake 2820.22 ml  Output 500 ml  Net 2320.22 ml   Filed Weights   12/21/19 0946  Weight: 104.3 kg    Examination:  General exam: Appears calm and comfortable  Respiratory system: Clear to auscultation. Respiratory effort normal. Cardiovascular system: S1 & S2 heard, RRR. No JVD, murmurs, rubs, gallops or clicks. No pedal edema. Gastrointestinal system: Abdomen is minimally distended, soft and nontender. No organomegaly or masses felt.  Diminished bowel sounds. Central nervous system: Alert and oriented. No focal neurological deficits. Extremities: Symmetric 5 x 5 power. Skin: No rashes, lesions or ulcers Psychiatry: Judgement and insight appear normal. Mood & affect appropriate.     Data Reviewed: I have personally reviewed following labs and imaging studies  CBC: Recent Labs  Lab 12/21/19 1011 12/22/19 0646 12/23/19 0602  WBC 12.5* 20.5* 14.2*  NEUTROABS 10.0*  --   --   HGB 15.4 17.0 14.5  HCT 47.3 53.6* 46.7  MCV 96.7 100.6* 101.1*  PLT 469* 477* 950   Basic Metabolic Panel: Recent Labs  Lab 12/21/19 1011 12/22/19 0704 12/23/19 0602  NA 139 141 141  K 3.1* 3.5 3.8  CL 96* 96* 103  CO2 29 27 26   GLUCOSE 168* 134* 102*  BUN 24* 29* 23*  CREATININE 2.43* 2.19* 1.62*  CALCIUM 10.2 9.6 8.8*  MG 2.2 2.2 2.1  PHOS  --   --  2.5   GFR: Estimated Creatinine Clearance: 65.5 mL/min (A) (by C-G formula based on SCr of 1.62 mg/dL (H)). Liver Function Tests: Recent Labs  Lab 12/21/19 1011 12/22/19 0704 12/23/19 0602  AST 29 24 27   ALT 24 18 18   ALKPHOS 96 98 75  BILITOT 0.7 0.6 0.8  PROT 8.7* 8.3* 7.2  ALBUMIN 4.5 4.0 3.7   Recent Labs  Lab 12/21/19 1011  LIPASE 17   No results for input(s): AMMONIA in the last 168 hours. Coagulation Profile: No results for input(s): INR, PROTIME in the last 168 hours. Cardiac Enzymes: No results for  input(s): CKTOTAL, CKMB, CKMBINDEX, TROPONINI in the last 168 hours. BNP (last 3 results) No results for input(s): PROBNP in the last 8760 hours. HbA1C: No results for input(s): HGBA1C in the last 72 hours. CBG: No results for input(s): GLUCAP in the last 168 hours. Lipid Profile: No results for input(s): CHOL, HDL, LDLCALC, TRIG, CHOLHDL, LDLDIRECT in the last 72 hours. Thyroid Function Tests: No results for input(s): TSH, T4TOTAL, FREET4, T3FREE, THYROIDAB in the last 72 hours. Anemia Panel: No results for input(s): VITAMINB12, FOLATE, FERRITIN, TIBC, IRON, RETICCTPCT in the last 72 hours. Sepsis Labs: No results for input(s): PROCALCITON, LATICACIDVEN in the last 168 hours.  Recent Results (from the past 240 hour(s))  SARS Coronavirus 2 by RT PCR (hospital order, performed in [facility] hospital lab) Nasopharyngeal Nasopharyngeal Swab     Status: None   Collection Time: 12/21/19  4:05 PM   Specimen: Nasopharyngeal Swab  Result Value Ref Range Status   SARS Coronavirus 2 NEGATIVE NEGATIVE Final    Comment: (NOTE) SARS-CoV-2 target nucleic acids are NOT DETECTED.  The SARS-CoV-2 RNA is generally detectable in upper and lower respiratory specimens during  the acute phase of infection. The lowest concentration of SARS-CoV-2 viral copies this assay can detect is 250 copies / mL. A negative result does not preclude SARS-CoV-2 infection and should not be used as the sole basis for treatment or other patient management decisions.  A negative result may occur with improper specimen collection / handling, submission of specimen other than nasopharyngeal swab, presence of viral mutation(s) within the areas targeted by this assay, and inadequate number of viral copies (<250 copies / mL). A negative result must be combined with clinical observations, patient history, and epidemiological information.  Fact Sheet for Patients:   StrictlyIdeas.no  Fact Sheet for  Healthcare Providers: BankingDealers.co.za  This test is not yet approved or  cleared by the [place] FDA and has been authorized for detection and/or diagnosis of SARS-CoV-2 by FDA under an Emergency Use Authorization (EUA).  This EUA will remain in effect (meaning this test can be used) for the duration of the COVID-19 declaration under Section 564(b)(1) of the Act, 21 U.S.C. section 360bbb-3(b)(1), unless the authorization is terminated or revoked sooner.  Performed at [facility], [address], [place], [place] [address]          Radiology Studies: CT Angio Chest/Abd/Pel for Dissection W and/or Wo Contrast  Result Date: 12/21/2019 CLINICAL DATA:  Tearing chest and abdominal pain EXAM: CT ANGIOGRAPHY CHEST, ABDOMEN AND PELVIS TECHNIQUE: Multidetector CT imaging through the chest, abdomen and pelvis was performed using the standard protocol during bolus administration of intravenous contrast. Multiplanar reconstructed images and MIPs were obtained and reviewed to evaluate the vascular anatomy. CONTRAST:  70mL OMNIPAQUE IOHEXOL 350 MG/ML SOLN after 4 hour protocol prep for contrast allergy COMPARISON:  06/05/2016 FINDINGS: CTA CHEST FINDINGS Cardiovascular: Heart size upper limits normal. No pericardial effusion. The RV is nondilated. Fair contrast opacification pulmonary arterial tree; the exam was not optimized for detection of pulmonary emboli. There is good contrast opacification of the thoracic aorta. No aneurysm. No dissection. No stenosis. Classic 3 vessel brachiocephalic arterial origin anatomy without proximal stenosis. No significant atheromatous change. Mediastinum/Nodes: Small hiatal hernia. No hilar or mediastinal adenopathy. Lungs/Pleura: No pleural effusion. No pneumothorax. Linear atelectasis or scarring in the lung bases. Musculoskeletal: No chest wall abnormality. No acute or significant osseous findings. Review of the MIP images confirms the above  findings. CTA ABDOMEN AND PELVIS FINDINGS VASCULAR Aorta: Normal caliber aorta without aneurysm, dissection, vasculitis or significant stenosis. Celiac: Patent without evidence of aneurysm, dissection, vasculitis or significant stenosis. SMA: Patent without evidence of aneurysm, dissection, vasculitis or significant stenosis. Renals: Both renal arteries are patent without evidence of aneurysm, dissection, vasculitis, fibromuscular dysplasia or significant stenosis. IMA: Patent without evidence of aneurysm, dissection, vasculitis or significant stenosis. Inflow: Minimal plaque at the aortic bifurcation. No dissection, aneurysm, or stenosis. Veins: No obvious venous abnormality within the limitations of this arterial phase study. Review of the MIP images confirms the above findings. NON-VASCULAR Hepatobiliary: No focal liver abnormality is seen. No gallstones, gallbladder wall thickening, or biliary dilatation. Pancreas: Unremarkable. No pancreatic ductal dilatation or surrounding inflammatory changes. Spleen: Normal in size without focal abnormality. Adrenals/Urinary Tract: Adrenal glands unremarkable. Kidneys enhance symmetrically without hydronephrosis. Urinary bladder physiologically distended. Stomach/Bowel: The stomach is decompressed. Duodenum unremarkable. There are multiple dilated mid abdominal small bowel loops. There is some edema in the mesentery on the right. Abrupt transition to decompressed distal small bowel just inferior to the umbilicus to the right of midline, without associated mass, abscess, or other pathologic finding. Colon is nondilated,  with a few distal descending and sigmoid diverticula, without adjacent inflammatory change or abscess. Lymphatic: No abdominal or pelvic adenopathy. Reproductive: Prostate is unremarkable. Other: Bilateral pelvic phleboliths. Small volume pelvic and perihepatic ascites. No free air. Musculoskeletal: Spondylitic changes in the lower lumbar spine. No fracture or  worrisome bone lesion. Review of the MIP images confirms the above findings. IMPRESSION: 1. Negative for aortic dissection or other acute vascular findings. 2. Mid/distal high-grade small bowel obstruction without evident etiology, suggesting adhesions. 3. Small volume pelvic and perihepatic ascites. 4. Descending and sigmoid diverticulosis. 5. Small hiatal hernia. Electronically Signed   By: Lucrezia Europe M.D.   On: 12/21/2019 14:27        Scheduled Meds: . amLODipine  10 mg Oral Daily  . bisacodyl  10 mg Rectal q morning - 10a  . metoprolol tartrate  5 mg Intravenous Q6H  . pantoprazole (PROTONIX) IV  40 mg Intravenous Q12H   Continuous Infusions: . 0.9 % NaCl with KCl 40 mEq / L 100 mL/hr at 12/23/19 0600     LOS: 2 days    Time spent: 35 minutes    Marites Nath Darleen Crocker, DO [facility]  If 7PM-7AM, please contact night-coverage [email or web address] 12/23/2019, 12:56 PM

## 2019-12-24 LAB — CBC
HCT: 42.4 % (ref 39.0–52.0)
Hemoglobin: 13.2 g/dL (ref 13.0–17.0)
MCH: 31.6 pg (ref 26.0–34.0)
MCHC: 31.1 g/dL (ref 30.0–36.0)
MCV: 101.4 fL — ABNORMAL HIGH (ref 80.0–100.0)
Platelets: 393 10*3/uL (ref 150–400)
RBC: 4.18 MIL/uL — ABNORMAL LOW (ref 4.22–5.81)
RDW: 14.2 % (ref 11.5–15.5)
WBC: 9.3 10*3/uL (ref 4.0–10.5)
nRBC: 0 % (ref 0.0–0.2)

## 2019-12-24 LAB — COMPREHENSIVE METABOLIC PANEL
ALT: 15 U/L (ref 0–44)
AST: 20 U/L (ref 15–41)
Albumin: 3.3 g/dL — ABNORMAL LOW (ref 3.5–5.0)
Alkaline Phosphatase: 65 U/L (ref 38–126)
Anion gap: 6 (ref 5–15)
BUN: 20 mg/dL (ref 6–20)
CO2: 25 mmol/L (ref 22–32)
Calcium: 8.5 mg/dL — ABNORMAL LOW (ref 8.9–10.3)
Chloride: 108 mmol/L (ref 98–111)
Creatinine, Ser: 1.4 mg/dL — ABNORMAL HIGH (ref 0.61–1.24)
GFR calc Af Amer: >60 mL/min (ref >60)
GFR calc non Af Amer: 56 mL/min — ABNORMAL LOW (ref >60)
Glucose, Bld: 95 mg/dL (ref 70–99)
Potassium: 3.6 mmol/L (ref 3.5–5.1)
Sodium: 139 mmol/L (ref 135–145)
Total Bilirubin: 1.1 mg/dL (ref 0.3–1.2)
Total Protein: 6.7 g/dL (ref 6.5–8.1)

## 2019-12-24 LAB — MAGNESIUM: Magnesium: 2.1 mg/dL (ref 1.7–2.4)

## 2019-12-24 MED ORDER — LISINOPRIL 10 MG PO TABS
10.0000 mg | ORAL_TABLET | Freq: Every day | ORAL | Status: DC
  Administered 2019-12-24 – 2019-12-25 (×2): 10 mg via ORAL
  Filled 2019-12-24 (×2): qty 1

## 2019-12-24 MED ORDER — METOPROLOL TARTRATE 50 MG PO TABS
100.0000 mg | ORAL_TABLET | Freq: Two times a day (BID) | ORAL | Status: DC
  Administered 2019-12-24 – 2019-12-31 (×13): 100 mg via ORAL
  Filled 2019-12-24 (×14): qty 2

## 2019-12-24 NOTE — Progress Notes (Signed)
  Subjective: Patient feels much better.  Had bowel movement this morning.  Denies any nausea or vomiting.  No significant abdominal pain noted.  Feels less distended.  Objective: Vital signs in last 24 hours: Temp:  [98.7 F (37.1 C)-99.8 F (37.7 C)] 98.7 F (37.1 C) (06/22 0500) Pulse Rate:  [72-80] 80 (06/22 0500) Resp:  [20] 20 (06/22 0500) BP: (155-197)/(90-116) 167/91 (06/22 0500) SpO2:  [95 %-99 %] 96 % (06/22 0500) Last BM Date: 12/21/19  Intake/Output from previous day: No intake/output data recorded. Intake/Output this shift: No intake/output data recorded.  General appearance: alert, cooperative and no distress GI: soft, non-tender; bowel sounds normal; no masses,  no organomegaly  Lab Results:  Recent Labs    12/23/19 0602 12/24/19 0611  WBC 14.2* 9.3  HGB 14.5 13.2  HCT 46.7 42.4  PLT 377 393   BMET Recent Labs    12/23/19 0602 12/24/19 0611  NA 141 139  K 3.8 3.6  CL 103 108  CO2 26 25  GLUCOSE 102* 95  BUN 23* 20  CREATININE 1.62* 1.40*  CALCIUM 8.8* 8.5*   PT/INR No results for input(s): LABPROT, INR in the last 72 hours.  Studies/Results: No results found.  Anti-infectives: Anti-infectives (From admission, onward)   None      Assessment/Plan: Impression: Small bowel obstruction, resolving.  Hypertension under better control. Plan: We will start advancing diet as tolerated.  No need for acute surgical intervention at this time.  LOS: 3 days    Aviva Signs 12/24/2019

## 2019-12-24 NOTE — Progress Notes (Signed)
PROGRESS NOTE    Tillman Kazmierski  YNW:[id number] DOB: 1961/05/10 DOA: 12/21/2019 PCP: Patient, No Pcp Per   Brief Narrative:  Per HPI: Boluwatife Hewittis a 59 y.o.malewith a history of uncontrolled hypertension, stage III chronic kidney disease, asthma, arthritis. Patient seen for onset of severe stabbing abdominal pain around and above his umbilicus that started around 3 AM and has been worsening. He attempted to take Pepto-Bismol, which was not effective. He has had some dry heaves earlier today, but has not had anything now. Pain radiates into his back. His pain is improved with IV pain medicine. No other palliating or provoking factors. Denies diarrhea, constipation, fevers, chills. Denies chest pain.  He does have a history of drug use with last cocaine use about a week ago.  Emergency Department Course: CT angiogram of his chest, abdomen, pelvis shows high-grade small bowel obstruction. No evidence of dissection. General surgery was consulted, who recommended admission. Did not feel NG tube was warranted at this time as the patient not currently dry heaving or vomiting.  -His diet has been advanced to full liquid diet by general surgery today.  His blood pressure still remain quite labile.  Since he appears to be tolerating diet, will resume all of his home blood pressure medications and continue to follow.  Anticipate discharge in the next 1-2 days pending clinical improvement.  Assessment & Plan:   Principal Problem:   SBO (small bowel obstruction) (HCC) Active Problems:   Acute renal failure superimposed on stage 3 chronic kidney disease (HCC)   History of COVID-19   Asthma   Hypertension   Hypertensive urgency   Hypokalemia   SBO-resolving -Diet advanced to full liquid today per general surgery -Appreciate further evaluation and recommendations -No need for intervention anticipated in the meantime  Hypertensive urgency in setting of uncontrolled chronic  hypertension -Patient noted to be positive for cocaine which is likely causing the elevation -Continue IV labetalol dose increased to 20 mg as needed for blood pressure elevations as needed which appears to be helping. -Discontinue IV metoprolol today -Resume home metoprolol as well as lisinopril today and monitor to see if further improvement is noted -Amlodipine has been resumed as well  AKI on CKD stage IIIa-resolved -Current creatinine approximately 1.4 -Baseline creatinine approximately 1.6  Hypokalemia-resolved -Monitor in a.m.  History of asthma with prior COVID-19 -Breathing treatments as needed -no acute bronchospasms currently  DVT prophylaxis:SCDs Code Status:Full code Family Communication:None at bedside Disposition Plan:Continue monitoring for resolution of SBO and blood pressure control. Status is: Inpatient  Remains inpatient appropriate because:Ongoing diagnostic testing needed not appropriate for outpatient work up, IV treatments appropriate due to intensity of illness or inability to take PO and Inpatient level of care appropriate due to severity of illness   Dispo: The patient is from:Home Anticipated d/c is MV:[facility] Anticipated d/c date is: 1-2 days Patient currently is not medically stable to d/c.   Need to ensure that diet as tolerated with no further symptoms and blood pressures controlled.  May be able to discharge by a.m. if stable and no further plans per general surgery.  Consultants:  General Surgery  Procedures:  See below  Antimicrobials:   None  Subjective: Patient seen and evaluated today with improvement in abdominal distention and pain noted.  He did have a bowel movement overnight.  His diet has been advanced to full liquid diet by general surgery.  Blood pressures continue to remain poorly controlled.  Objective: Vitals:   12/23/19 2041 12/24/19 0500 12/24/19  0817 12/24/19  0852  BP:  (!) 167/91 (!) 190/122 (!) 187/115  Pulse:  80 77   Resp:  20    Temp:  98.7 F (37.1 C)    TempSrc:  Oral    SpO2: 95% 96% 100%   Weight:      Height:        Intake/Output Summary (Last 24 hours) at 12/24/2019 1049 Last data filed at 12/24/2019 0945 Gross per 24 hour  Intake 120 ml  Output --  Net 120 ml   Filed Weights   12/21/19 0946  Weight: 104.3 kg    Examination:  General exam: Appears calm and comfortable  Respiratory system: Clear to auscultation. Respiratory effort normal. Cardiovascular system: S1 & S2 heard, RRR. No JVD, murmurs, rubs, gallops or clicks. No pedal edema. Gastrointestinal system: Abdomen is minimally distended, soft and nontender. No organomegaly or masses felt. Normal bowel sounds heard. Central nervous system: Alert and oriented. No focal neurological deficits. Extremities: Symmetric 5 x 5 power. Skin: No rashes, lesions or ulcers Psychiatry: Judgement and insight appear normal. Mood & affect appropriate.     Data Reviewed: I have personally reviewed following labs and imaging studies  CBC: Recent Labs  Lab 12/21/19 1011 12/22/19 0646 12/23/19 0602 12/24/19 0611  WBC 12.5* 20.5* 14.2* 9.3  NEUTROABS 10.0*  --   --   --   HGB 15.4 17.0 14.5 13.2  HCT 47.3 53.6* 46.7 42.4  MCV 96.7 100.6* 101.1* 101.4*  PLT 469* 477* 377 681   Basic Metabolic Panel: Recent Labs  Lab 12/21/19 1011 12/22/19 0704 12/23/19 0602 12/24/19 0611  NA 139 141 141 139  K 3.1* 3.5 3.8 3.6  CL 96* 96* 103 108  CO2 29 27 26 25   GLUCOSE 168* 134* 102* 95  BUN 24* 29* 23* 20  CREATININE 2.43* 2.19* 1.62* 1.40*  CALCIUM 10.2 9.6 8.8* 8.5*  MG 2.2 2.2 2.1 2.1  PHOS  --   --  2.5  --    GFR: Estimated Creatinine Clearance: 75.8 mL/min (A) (by C-G formula based on SCr of 1.4 mg/dL (H)). Liver Function Tests: Recent Labs  Lab 12/21/19 1011 12/22/19 0704 12/23/19 0602 12/24/19 0611  AST 29 24 27 20   ALT 24 18 18 15   ALKPHOS 96 98 75 65   BILITOT 0.7 0.6 0.8 1.1  PROT 8.7* 8.3* 7.2 6.7  ALBUMIN 4.5 4.0 3.7 3.3*   Recent Labs  Lab 12/21/19 1011  LIPASE 17   No results for input(s): AMMONIA in the last 168 hours. Coagulation Profile: No results for input(s): INR, PROTIME in the last 168 hours. Cardiac Enzymes: No results for input(s): CKTOTAL, CKMB, CKMBINDEX, TROPONINI in the last 168 hours. BNP (last 3 results) No results for input(s): PROBNP in the last 8760 hours. HbA1C: No results for input(s): HGBA1C in the last 72 hours. CBG: No results for input(s): GLUCAP in the last 168 hours. Lipid Profile: No results for input(s): CHOL, HDL, LDLCALC, TRIG, CHOLHDL, LDLDIRECT in the last 72 hours. Thyroid Function Tests: No results for input(s): TSH, T4TOTAL, FREET4, T3FREE, THYROIDAB in the last 72 hours. Anemia Panel: No results for input(s): VITAMINB12, FOLATE, FERRITIN, TIBC, IRON, RETICCTPCT in the last 72 hours. Sepsis Labs: No results for input(s): PROCALCITON, LATICACIDVEN in the last 168 hours.  Recent Results (from the past 240 hour(s))  SARS Coronavirus 2 by RT PCR (hospital order, performed in [facility] hospital lab) Nasopharyngeal Nasopharyngeal Swab     Status: None   Collection  Time: 12/21/19  4:05 PM   Specimen: Nasopharyngeal Swab  Result Value Ref Range Status   SARS Coronavirus 2 NEGATIVE NEGATIVE Final    Comment: (NOTE) SARS-CoV-2 target nucleic acids are NOT DETECTED.  The SARS-CoV-2 RNA is generally detectable in upper and lower respiratory specimens during the acute phase of infection. The lowest concentration of SARS-CoV-2 viral copies this assay can detect is 250 copies / mL. A negative result does not preclude SARS-CoV-2 infection and should not be used as the sole basis for treatment or other patient management decisions.  A negative result may occur with improper specimen collection / handling, submission of specimen other than nasopharyngeal swab, presence of viral mutation(s)  within the areas targeted by this assay, and inadequate number of viral copies (<250 copies / mL). A negative result must be combined with clinical observations, patient history, and epidemiological information.  Fact Sheet for Patients:   StrictlyIdeas.no  Fact Sheet for Healthcare Providers: BankingDealers.co.za  This test is not yet approved or  cleared by the [place] FDA and has been authorized for detection and/or diagnosis of SARS-CoV-2 by FDA under an Emergency Use Authorization (EUA).  This EUA will remain in effect (meaning this test can be used) for the duration of the COVID-19 declaration under Section 564(b)(1) of the Act, 21 U.S.C. section 360bbb-3(b)(1), unless the authorization is terminated or revoked sooner.  Performed at [facility], [address] [place], [place] [address]          Radiology Studies: No results found.      Scheduled Meds: . amLODipine  10 mg Oral Daily  . bisacodyl  10 mg Rectal q morning - 10a  . lisinopril  10 mg Oral Daily  . metoprolol tartrate  100 mg Oral BID  . pantoprazole (PROTONIX) IV  40 mg Intravenous Q12H    LOS: 3 days    Time spent: 30 minutes    Alandra Sando Darleen Crocker, DO [facility]  If 7PM-7AM, please contact night-coverage [email or web address] 12/24/2019, 10:49 AM

## 2019-12-25 ENCOUNTER — Inpatient Hospital Stay (HOSPITAL_COMMUNITY): Payer: Medicaid - Out of State

## 2019-12-25 DIAGNOSIS — N1831 Chronic kidney disease, stage 3a: Secondary | ICD-10-CM

## 2019-12-25 DIAGNOSIS — Z8616 Personal history of COVID-19: Secondary | ICD-10-CM

## 2019-12-25 DIAGNOSIS — I10 Essential (primary) hypertension: Secondary | ICD-10-CM

## 2019-12-25 DIAGNOSIS — E876 Hypokalemia: Secondary | ICD-10-CM

## 2019-12-25 DIAGNOSIS — N179 Acute kidney failure, unspecified: Secondary | ICD-10-CM

## 2019-12-25 LAB — BASIC METABOLIC PANEL
Anion gap: 10 (ref 5–15)
BUN: 16 mg/dL (ref 6–20)
CO2: 26 mmol/L (ref 22–32)
Calcium: 8.7 mg/dL — ABNORMAL LOW (ref 8.9–10.3)
Chloride: 101 mmol/L (ref 98–111)
Creatinine, Ser: 1.42 mg/dL — ABNORMAL HIGH (ref 0.61–1.24)
GFR calc Af Amer: >60 mL/min (ref >60)
GFR calc non Af Amer: 55 mL/min — ABNORMAL LOW (ref >60)
Glucose, Bld: 108 mg/dL — ABNORMAL HIGH (ref 70–99)
Potassium: 3.5 mmol/L (ref 3.5–5.1)
Sodium: 137 mmol/L (ref 135–145)

## 2019-12-25 LAB — CBC
HCT: 41.4 % (ref 39.0–52.0)
Hemoglobin: 12.9 g/dL — ABNORMAL LOW (ref 13.0–17.0)
MCH: 31.3 pg (ref 26.0–34.0)
MCHC: 31.2 g/dL (ref 30.0–36.0)
MCV: 100.5 fL — ABNORMAL HIGH (ref 80.0–100.0)
Platelets: 382 10*3/uL (ref 150–400)
RBC: 4.12 MIL/uL — ABNORMAL LOW (ref 4.22–5.81)
RDW: 13.9 % (ref 11.5–15.5)
WBC: 6.8 10*3/uL (ref 4.0–10.5)
nRBC: 0 % (ref 0.0–0.2)

## 2019-12-25 MED ORDER — METOPROLOL TARTRATE 5 MG/5ML IV SOLN
5.0000 mg | INTRAVENOUS | Status: DC | PRN
  Administered 2019-12-25: 5 mg via INTRAVENOUS
  Filled 2019-12-25: qty 5

## 2019-12-25 MED ORDER — LISINOPRIL 10 MG PO TABS
20.0000 mg | ORAL_TABLET | Freq: Every day | ORAL | Status: DC
  Administered 2019-12-26: 20 mg via ORAL
  Filled 2019-12-25 (×2): qty 2

## 2019-12-25 MED ORDER — PROCHLORPERAZINE EDISYLATE 10 MG/2ML IJ SOLN
10.0000 mg | Freq: Four times a day (QID) | INTRAMUSCULAR | Status: DC | PRN
  Administered 2019-12-25: 10 mg via INTRAVENOUS
  Filled 2019-12-25: qty 2

## 2019-12-25 MED ORDER — POLYETHYLENE GLYCOL 3350 17 G PO PACK
17.0000 g | PACK | Freq: Every day | ORAL | Status: DC
  Administered 2019-12-25: 17 g via ORAL
  Filled 2019-12-25 (×4): qty 1

## 2019-12-25 NOTE — Progress Notes (Signed)
MD notified of pt vomiting during bedside report. Large amount of yellow emesis. Pt reports pain and nausea.

## 2019-12-25 NOTE — TOC Initial Note (Signed)
Transition of Care [facility]) - Initial/Assessment Note    Patient Details  Name: Ethan Schmidt MRN: [id number] Date of Birth: [birth year]  Transition of Care [facility]) CM/SW Contact:    Salome Arnt, [place] Phone Number: 12/25/2019, 11:40 AM  Clinical Narrative:   Pt admitted with small bowel obstruction- complaining of a lot of abdominal pain this morning. MD aware. Pt reports he lives with his brother and said his sister, Ethan Schmidt is his best support. Pt is independent with ADLs at baseline. He plans to return home when medically stable. LCSW verified with pt that he does not have PCP and provided PCP list for pt to call. Will continue to follow for d/c planning needs.                 Expected Discharge Plan: Home/Self Care Barriers to Discharge: Continued Medical Work up   Patient Goals and CMS Choice Patient states their goals for this hospitalization and ongoing recovery are:: return home      Expected Discharge Plan and Services Expected Discharge Plan: Home/Self Care In-house Referral: Clinical Social Work     Living arrangements for the past 2 months: Single Family Home                                      Prior Living Arrangements/Services Living arrangements for the past 2 months: Single Family Home Lives with:: Siblings Patient language and need for interpreter reviewed:: Yes Do you feel safe going back to the place where you live?: Yes      Need for Family Participation in Patient Care: No (Comment) Care giver support system in place?: Yes (comment)   Criminal Activity/Legal Involvement Pertinent to Current Situation/Hospitalization: No - Comment as needed  Activities of Daily Living Home Assistive Devices/Equipment: None ADL Screening (condition at time of admission) Patient's cognitive ability adequate to safely complete daily activities?: Yes Is the patient deaf or have difficulty hearing?: No Does the patient have difficulty seeing, even when  wearing glasses/contacts?: No Does the patient have difficulty concentrating, remembering, or making decisions?: No Patient able to express need for assistance with ADLs?: Yes Does the patient have difficulty dressing or bathing?: No Independently performs ADLs?: Yes (appropriate for developmental age) Does the patient have difficulty walking or climbing stairs?: No Weakness of Legs: None Weakness of Arms/Hands: None  Permission Sought/Granted                  Emotional Assessment Appearance:: Appears stated age Attitude/Demeanor/Rapport: Other (comment) (In pain) Affect (typically observed): Accepting Orientation: : Oriented to Self, Oriented to Place, Oriented to  Time, Oriented to Situation Alcohol / Substance Use: Not Applicable Psych Involvement: No (comment)  Admission diagnosis:  Hypokalemia [E87.6] SBO (small bowel obstruction) ([place]) [K56.609] Intestinal obstruction, unspecified cause, unspecified whether partial or complete [facility]) [K56.609] Patient Active Problem List   Diagnosis Date Noted  . SBO (small bowel obstruction) ([place]) 12/21/2019  . History of COVID-19 12/21/2019  . Hypertensive urgency 12/21/2019  . Hypokalemia 12/21/2019  . Asthma   . Hypertension   . Vitamin D insufficiency 11/15/2019  . Tobacco abuse 06/29/2014  . Asthma exacerbation 06/29/2014  . Acute renal failure superimposed on stage 3 chronic kidney disease ([place]) 06/29/2014   PCP:  Patient, No Pcp Per Pharmacy:   [place] [place] [facility] [address] [place] [address] Phone: [phone] Fax:  [phone]  [facility] [place] [place] [address] [place] [address] [place] [address] Phone: [phone] Fax: [phone]     Social Determinants of Health (SDOH) Interventions    Readmission Risk Interventions Readmission Risk Prevention Plan 12/25/2019  Transportation Screening  Complete  PCP or Specialist Appt within 3-5 Days Not Complete  HRI or [place] Complete  Social Work Consult for [place] Planning/Counseling Complete  Palliative Care Screening Not Applicable  Medication Review Press photographer) Complete  Some recent data might be hidden

## 2019-12-25 NOTE — Progress Notes (Signed)
PROGRESS NOTE   Pratyush Ammon  FWY:[id number] DOB: [birth year] DOA: 12/21/2019 PCP: Patient, No Pcp Per   Chief Complaint  Patient presents with  . Dizziness    Brief Admission History:  59 y.o.malewith a history of uncontrolled hypertension, stage III chronic kidney disease, asthma, arthritis. Patient seen for onset of severe stabbing abdominal pain around and above his umbilicus that started around 3 AM and has been worsening. He attempted to take Pepto-Bismol, which was not effective. He has had some dry heaves earlier today, but has not had anything now. Pain radiates into his back. His pain is improved with IV pain medicine. No other palliating or provoking factors. Denies diarrhea, constipation, fevers, chills. Denies chest pain.  He does have a history of drug use with reported last cocaine use about a week ago.  Assessment & Plan:   Principal Problem:   SBO (small bowel obstruction) (HCC) Active Problems:   Acute renal failure superimposed on stage 3 chronic kidney disease (HCC)   History of COVID-19   Asthma   Hypertension   Hypertensive urgency   Hypokalemia  1. SBO - slowly improving but still no BM, Miralax given this morning, diet slowly being advanced. Continues to have abdominal pain will not DC today.  2. Hypertensive urgency - resumed home BP meds and follow.   3. AKI on CKD stage 3a - creatinine improved.  4. Hypokalemia - repleted.  5. Asthma - bronchodilators as needed.    DVT prophylaxis:  SCD Code Status:  FULL  Family Communication:  Disposition:   Status is: Inpatient  Remains inpatient appropriate because:Ongoing active pain requiring inpatient pain management and still no BM   Dispo: The patient is from: Home              Anticipated d/c is to: Home              Anticipated d/c date is: 1 day              Patient currently is not medically stable to d/c.   Consultants:   Surgery   Procedures:     Antimicrobials:     Subjective: Pt reports abdominal pain and bloating today.  Does not feel well.   Objective: Vitals:   12/24/19 2021 12/24/19 2118 12/25/19 0614 12/25/19 0925  BP:  (!) 157/92 (!) 168/114   Pulse:  65 (!) 55 65  Resp:  20 20   Temp:  98.8 F (37.1 C) 98.6 F (37 C)   TempSrc:  Oral Oral   SpO2: 98% 98% 97%   Weight:      Height:        Intake/Output Summary (Last 24 hours) at 12/25/2019 1053 Last data filed at 12/24/2019 2100 Gross per 24 hour  Intake 410 ml  Output --  Net 410 ml   Filed Weights   12/21/19 0946  Weight: 104.3 kg    Examination:  General exam: Appears calm and comfortable  Respiratory system: Clear to auscultation. Respiratory effort normal. Cardiovascular system: S1 & S2 heard, RRR. No JVD, murmurs, rubs, gallops or clicks. No pedal edema. Gastrointestinal system: Abdomen is mildly distended, soft and diffuse tenderness to palpation. No organomegaly or masses felt. Normal bowel sounds heard. Central nervous system: Alert and oriented. No focal neurological deficits. Extremities: Symmetric 5 x 5 power. Skin: No rashes, lesions or ulcers Psychiatry: Judgement and insight appear normal. Mood & affect appropriate.   Data Reviewed: I have personally reviewed following labs and imaging studies  CBC: Recent Labs  Lab 12/21/19 1011 12/22/19 0646 12/23/19 0602 12/24/19 0611 12/25/19 0558  WBC 12.5* 20.5* 14.2* 9.3 6.8  NEUTROABS 10.0*  --   --   --   --   HGB 15.4 17.0 14.5 13.2 12.9*  HCT 47.3 53.6* 46.7 42.4 41.4  MCV 96.7 100.6* 101.1* 101.4* 100.5*  PLT 469* 477* 377 393 572    Basic Metabolic Panel: Recent Labs  Lab 12/21/19 1011 12/22/19 0704 12/23/19 0602 12/24/19 0611 12/25/19 0558  NA 139 141 141 139 137  K 3.1* 3.5 3.8 3.6 3.5  CL 96* 96* 103 108 101  CO2 29 27 26 25 26   GLUCOSE 168* 134* 102* 95 108*  BUN 24* 29* 23* 20 16  CREATININE 2.43* 2.19* 1.62* 1.40* 1.42*  CALCIUM 10.2 9.6 8.8* 8.5* 8.7*  MG 2.2 2.2 2.1 2.1  --    PHOS  --   --  2.5  --   --     GFR: Estimated Creatinine Clearance: 74.8 mL/min (A) (by C-G formula based on SCr of 1.42 mg/dL (H)).  Liver Function Tests: Recent Labs  Lab 12/21/19 1011 12/22/19 0704 12/23/19 0602 12/24/19 0611  AST 29 24 27 20   ALT 24 18 18 15   ALKPHOS 96 98 75 65  BILITOT 0.7 0.6 0.8 1.1  PROT 8.7* 8.3* 7.2 6.7  ALBUMIN 4.5 4.0 3.7 3.3*    CBG: No results for input(s): GLUCAP in the last 168 hours.  Recent Results (from the past 240 hour(s))  SARS Coronavirus 2 by RT PCR (hospital order, performed in [facility] hospital lab) Nasopharyngeal Nasopharyngeal Swab     Status: None   Collection Time: 12/21/19  4:05 PM   Specimen: Nasopharyngeal Swab  Result Value Ref Range Status   SARS Coronavirus 2 NEGATIVE NEGATIVE Final    Comment: (NOTE) SARS-CoV-2 target nucleic acids are NOT DETECTED.  The SARS-CoV-2 RNA is generally detectable in upper and lower respiratory specimens during the acute phase of infection. The lowest concentration of SARS-CoV-2 viral copies this assay can detect is 250 copies / mL. A negative result does not preclude SARS-CoV-2 infection and should not be used as the sole basis for treatment or other patient management decisions.  A negative result may occur with improper specimen collection / handling, submission of specimen other than nasopharyngeal swab, presence of viral mutation(s) within the areas targeted by this assay, and inadequate number of viral copies (<250 copies / mL). A negative result must be combined with clinical observations, patient history, and epidemiological information.  Fact Sheet for Patients:   StrictlyIdeas.no  Fact Sheet for Healthcare Providers: BankingDealers.co.za  This test is not yet approved or  cleared by the [place] FDA and has been authorized for detection and/or diagnosis of SARS-CoV-2 by FDA under an Emergency Use Authorization  (EUA).  This EUA will remain in effect (meaning this test can be used) for the duration of the COVID-19 declaration under Section 564(b)(1) of the Act, 21 U.S.C. section 360bbb-3(b)(1), unless the authorization is terminated or revoked sooner.  Performed at [facility], [address], [place], [place] [address]      Radiology Studies: No results found.   Scheduled Meds: . amLODipine  10 mg Oral Daily  . bisacodyl  10 mg Rectal q morning - 10a  . lisinopril  10 mg Oral Daily  . metoprolol tartrate  100 mg Oral BID  . pantoprazole (PROTONIX) IV  40 mg Intravenous Q12H  . polyethylene glycol  17  g Oral Daily   Continuous Infusions:   LOS: 4 days   Time spent: 25 mins    Solymar Grace Wynetta Emery, MD How to contact the [facility] Attending or Consulting provider [place] or covering provider during after hours [place], for this patient?  1. Check the care team in [facility] and look for a) attending/consulting TRH provider listed and b) the [facility] team listed 2. Log into [email or web address] and use [place]'s universal password to access. If you do not have the password, please contact the hospital operator. 3. Locate the [facility] provider you are looking for under [facility] and page to a number that you can be directly reached. 4. If you still have difficulty reaching the provider, please page the [facility] (Director on Call) for the Hospitalists listed on amion for assistance.  12/25/2019, 10:53 AM

## 2019-12-25 NOTE — Progress Notes (Signed)
  Subjective: Patient states he has had several bowel movements yesterday. States his abdominal pain has resolved. Feels less distended.  Objective: Vital signs in last 24 hours: Temp:  [98.6 F (37 C)-98.8 F (37.1 C)] 98.6 F (37 C) (06/23 7544) Pulse Rate:  [55-80] 65 (06/23 0925) Resp:  [20] 20 (06/23 0614) BP: (147-183)/(90-116) 168/114 (06/23 0614) SpO2:  [97 %-100 %] 97 % (06/23 0614) Last BM Date: 12/25/19  Intake/Output from previous day: 06/22 0701 - 06/23 0700 In: 530 [P.O.:530] Out: -  Intake/Output this shift: No intake/output data recorded.  General appearance: alert, cooperative and no distress GI: soft, non-tender; bowel sounds normal; no masses,  no organomegaly  Lab Results:  Recent Labs    12/24/19 0611 12/25/19 0558  WBC 9.3 6.8  HGB 13.2 12.9*  HCT 42.4 41.4  PLT 393 382   BMET Recent Labs    12/24/19 0611 12/25/19 0558  NA 139 137  K 3.6 3.5  CL 108 101  CO2 25 26  GLUCOSE 95 108*  BUN 20 16  CREATININE 1.40* 1.42*  CALCIUM 8.5* 8.7*   PT/INR No results for input(s): LABPROT, INR in the last 72 hours.  Studies/Results: No results found.  Anti-infectives: Anti-infectives (From admission, onward)   None      Assessment/Plan: Impression: Partial small bowel obstruction, slowly resolving Plan: Patient would like to try a soft diet which is fine with me. I have added MiraLAX. His abdominal pain seems to wax and wane, but on physical exam, it has improved.  LOS: 4 days    Aviva Signs 12/25/2019

## 2019-12-25 NOTE — Progress Notes (Signed)
MD notified of pt concern with taking PO medications while nauseous. Current BP 177/89

## 2019-12-25 NOTE — Plan of Care (Signed)
  Problem: Education: Goal: Knowledge of General Education information will improve Description: Including pain rating scale, medication(s)/side effects and non-pharmacologic comfort measures Outcome: Progressing   Problem: Health Behavior/Discharge Planning: Goal: Ability to manage health-related needs will improve Outcome: Progressing   Problem: Clinical Measurements: Goal: Will remain free from infection Outcome: Progressing   Problem: Elimination: Goal: Will not experience complications related to bowel motility Outcome: Progressing

## 2019-12-26 ENCOUNTER — Inpatient Hospital Stay (HOSPITAL_COMMUNITY): Payer: Medicaid - Out of State

## 2019-12-26 DIAGNOSIS — I160 Hypertensive urgency: Secondary | ICD-10-CM

## 2019-12-26 DIAGNOSIS — R109 Unspecified abdominal pain: Secondary | ICD-10-CM

## 2019-12-26 DIAGNOSIS — R1084 Generalized abdominal pain: Secondary | ICD-10-CM

## 2019-12-26 LAB — CBC WITH DIFFERENTIAL/PLATELET
Abs Immature Granulocytes: 0.01 10*3/uL (ref 0.00–0.07)
Basophils Absolute: 0 10*3/uL (ref 0.0–0.1)
Basophils Relative: 1 %
Eosinophils Absolute: 0.2 10*3/uL (ref 0.0–0.5)
Eosinophils Relative: 2 %
HCT: 42.6 % (ref 39.0–52.0)
Hemoglobin: 13.9 g/dL (ref 13.0–17.0)
Immature Granulocytes: 0 %
Lymphocytes Relative: 23 %
Lymphs Abs: 1.8 10*3/uL (ref 0.7–4.0)
MCH: 32 pg (ref 26.0–34.0)
MCHC: 32.6 g/dL (ref 30.0–36.0)
MCV: 98.2 fL (ref 80.0–100.0)
Monocytes Absolute: 0.8 10*3/uL (ref 0.1–1.0)
Monocytes Relative: 10 %
Neutro Abs: 4.9 10*3/uL (ref 1.7–7.7)
Neutrophils Relative %: 64 %
Platelets: 437 10*3/uL — ABNORMAL HIGH (ref 150–400)
RBC: 4.34 MIL/uL (ref 4.22–5.81)
RDW: 13.2 % (ref 11.5–15.5)
WBC: 7.6 10*3/uL (ref 4.0–10.5)
nRBC: 0 % (ref 0.0–0.2)

## 2019-12-26 LAB — COMPREHENSIVE METABOLIC PANEL
ALT: 23 U/L (ref 0–44)
AST: 23 U/L (ref 15–41)
Albumin: 3.5 g/dL (ref 3.5–5.0)
Alkaline Phosphatase: 71 U/L (ref 38–126)
Anion gap: 14 (ref 5–15)
BUN: 19 mg/dL (ref 6–20)
CO2: 24 mmol/L (ref 22–32)
Calcium: 9.1 mg/dL (ref 8.9–10.3)
Chloride: 100 mmol/L (ref 98–111)
Creatinine, Ser: 1.6 mg/dL — ABNORMAL HIGH (ref 0.61–1.24)
GFR calc Af Amer: 55 mL/min — ABNORMAL LOW (ref >60)
GFR calc non Af Amer: 47 mL/min — ABNORMAL LOW (ref >60)
Glucose, Bld: 111 mg/dL — ABNORMAL HIGH (ref 70–99)
Potassium: 3.3 mmol/L — ABNORMAL LOW (ref 3.5–5.1)
Sodium: 138 mmol/L (ref 135–145)
Total Bilirubin: 0.7 mg/dL (ref 0.3–1.2)
Total Protein: 7.1 g/dL (ref 6.5–8.1)

## 2019-12-26 MED ORDER — POTASSIUM CHLORIDE CRYS ER 20 MEQ PO TBCR
40.0000 meq | EXTENDED_RELEASE_TABLET | Freq: Once | ORAL | Status: AC
  Administered 2019-12-26: 40 meq via ORAL
  Filled 2019-12-26: qty 2

## 2019-12-26 MED ORDER — HYDRALAZINE HCL 25 MG PO TABS
25.0000 mg | ORAL_TABLET | Freq: Four times a day (QID) | ORAL | Status: DC | PRN
  Administered 2019-12-26: 25 mg via ORAL
  Filled 2019-12-26: qty 1

## 2019-12-26 MED ORDER — HYDRALAZINE HCL 25 MG PO TABS
50.0000 mg | ORAL_TABLET | Freq: Three times a day (TID) | ORAL | Status: DC
  Administered 2019-12-26 (×2): 50 mg via ORAL
  Filled 2019-12-26 (×2): qty 2

## 2019-12-26 MED ORDER — HYDRALAZINE HCL 25 MG PO TABS
100.0000 mg | ORAL_TABLET | Freq: Three times a day (TID) | ORAL | Status: DC
  Administered 2019-12-26 – 2019-12-28 (×5): 100 mg via ORAL
  Filled 2019-12-26 (×7): qty 4

## 2019-12-26 MED ORDER — PREDNISONE 20 MG PO TABS
50.0000 mg | ORAL_TABLET | Freq: Four times a day (QID) | ORAL | Status: AC
  Administered 2019-12-26 (×3): 50 mg via ORAL
  Filled 2019-12-26 (×3): qty 1

## 2019-12-26 MED ORDER — DIPHENHYDRAMINE HCL 50 MG/ML IJ SOLN: 50.0000 mg | Freq: Once | INTRAMUSCULAR | Status: AC

## 2019-12-26 MED ORDER — DIPHENHYDRAMINE HCL 25 MG PO CAPS
50.0000 mg | ORAL_CAPSULE | Freq: Once | ORAL | Status: AC
  Administered 2019-12-26: 50 mg via ORAL
  Filled 2019-12-26: qty 2

## 2019-12-26 NOTE — Progress Notes (Signed)
PROGRESS NOTE   Ethan Schmidt  ULA:[id number] DOB: March 28, 1961 DOA: 12/21/2019 PCP: Patient, No Pcp Per   Chief Complaint  Patient presents with  . Dizziness    Brief Admission History:  59 y.o.malewith a history of uncontrolled hypertension, stage III chronic kidney disease, asthma, arthritis. Patient seen for onset of severe stabbing abdominal pain around and above his umbilicus that started around 3 AM and has been worsening. He attempted to take Pepto-Bismol, which was not effective. He has had some dry heaves earlier today, but has not had anything now. Pain radiates into his back. His pain is improved with IV pain medicine. No other palliating or provoking factors. Denies diarrhea, constipation, fevers, chills. Denies chest pain.  He does have a history of drug use with reported last cocaine use about a week ago.  Assessment & Plan:   Principal Problem:   SBO (small bowel obstruction) (HCC) Active Problems:   Acute renal failure superimposed on stage 3 chronic kidney disease (HCC)   History of COVID-19   Asthma   Hypertension   Hypertensive urgency   Hypokalemia  1. SBO - slowly improving and now having BMs, but still bloating and pain. I discussed with Dr. Arnoldo Morale will order CT abd pelvis with oral contrast.  2. Hypertensive urgency - BP remains poorly controlled, titrated meds and added hydralazine scheduled   3. AKI on CKD stage 3a - creatinine improved.  4. Hypokalemia - repleted.  5. Asthma - bronchodilators as needed.    DVT prophylaxis:  SCD Code Status:  FULL  Family Communication:  Disposition:   Status is: Inpatient  Remains inpatient appropriate because:Ongoing active pain requiring inpatient pain management and still no BM   Dispo: The patient is from: Home              Anticipated d/c is to: Home              Anticipated d/c date is: 1 day              Patient currently is not medically stable to d/c.   Consultants:   Surgery    Procedures:     Antimicrobials:    Subjective: Pt reports abdominal pain and bloating and having loose stools.    Objective: Vitals:   12/26/19 1030 12/26/19 1248 12/26/19 1345 12/26/19 1520  BP: (!) 190/120 (!) 177/100 (!) 171/109 (!) 161/109  Pulse:   77   Resp:   18   Temp:      TempSrc:      SpO2:   98%   Weight:      Height:        Intake/Output Summary (Last 24 hours) at 12/26/2019 1542 Last data filed at 12/26/2019 1300 Gross per 24 hour  Intake 480 ml  Output --  Net 480 ml   Filed Weights   12/21/19 0946  Weight: 104.3 kg    Examination:  General exam: Appears calm and comfortable  Respiratory system: Clear to auscultation. Respiratory effort normal. Cardiovascular system: S1 & S2 heard, RRR. No JVD, murmurs, rubs, gallops or clicks. No pedal edema. Gastrointestinal system: Abdomen is mildly distended, soft and diffuse tenderness to palpation. No organomegaly or masses felt. Normal bowel sounds heard. Central nervous system: Alert and oriented. No focal neurological deficits. Extremities: Symmetric 5 x 5 power. Skin: No rashes, lesions or ulcers Psychiatry: Judgement and insight appear normal. Mood & affect appropriate.   Data Reviewed: I have personally reviewed following labs and imaging studies  CBC: Recent Labs  Lab 12/21/19 1011 12/21/19 1011 12/22/19 0646 12/23/19 0602 12/24/19 0611 12/25/19 0558 12/26/19 0508  WBC 12.5*   < > 20.5* 14.2* 9.3 6.8 7.6  NEUTROABS 10.0*  --   --   --   --   --  4.9  HGB 15.4   < > 17.0 14.5 13.2 12.9* 13.9  HCT 47.3   < > 53.6* 46.7 42.4 41.4 42.6  MCV 96.7   < > 100.6* 101.1* 101.4* 100.5* 98.2  PLT 469*   < > 477* 377 393 382 437*   < > = values in this interval not displayed.    Basic Metabolic Panel: Recent Labs  Lab 12/21/19 1011 12/21/19 1011 12/22/19 0704 12/23/19 0602 12/24/19 0611 12/25/19 0558 12/26/19 0508  NA 139   < > 141 141 139 137 138  K 3.1*   < > 3.5 3.8 3.6 3.5 3.3*  CL  96*   < > 96* 103 108 101 100  CO2 29   < > 27 26 25 26 24   GLUCOSE 168*   < > 134* 102* 95 108* 111*  BUN 24*   < > 29* 23* 20 16 19   CREATININE 2.43*   < > 2.19* 1.62* 1.40* 1.42* 1.60*  CALCIUM 10.2   < > 9.6 8.8* 8.5* 8.7* 9.1  MG 2.2  --  2.2 2.1 2.1  --   --   PHOS  --   --   --  2.5  --   --   --    < > = values in this interval not displayed.    GFR: Estimated Creatinine Clearance: 66.4 mL/min (A) (by C-G formula based on SCr of 1.6 mg/dL (H)).  Liver Function Tests: Recent Labs  Lab 12/21/19 1011 12/22/19 0704 12/23/19 0602 12/24/19 0611 12/26/19 0508  AST 29 24 27 20 23   ALT 24 18 18 15 23   ALKPHOS 96 98 75 65 71  BILITOT 0.7 0.6 0.8 1.1 0.7  PROT 8.7* 8.3* 7.2 6.7 7.1  ALBUMIN 4.5 4.0 3.7 3.3* 3.5    CBG: No results for input(s): GLUCAP in the last 168 hours.  Recent Results (from the past 240 hour(s))  SARS Coronavirus 2 by RT PCR (hospital order, performed in [facility] hospital lab) Nasopharyngeal Nasopharyngeal Swab     Status: None   Collection Time: 12/21/19  4:05 PM   Specimen: Nasopharyngeal Swab  Result Value Ref Range Status   SARS Coronavirus 2 NEGATIVE NEGATIVE Final    Comment: (NOTE) SARS-CoV-2 target nucleic acids are NOT DETECTED.  The SARS-CoV-2 RNA is generally detectable in upper and lower respiratory specimens during the acute phase of infection. The lowest concentration of SARS-CoV-2 viral copies this assay can detect is 250 copies / mL. A negative result does not preclude SARS-CoV-2 infection and should not be used as the sole basis for treatment or other patient management decisions.  A negative result may occur with improper specimen collection / handling, submission of specimen other than nasopharyngeal swab, presence of viral mutation(s) within the areas targeted by this assay, and inadequate number of viral copies (<250 copies / mL). A negative result must be combined with clinical observations, patient history, and  epidemiological information.  Fact Sheet for Patients:   StrictlyIdeas.no  Fact Sheet for Healthcare Providers: BankingDealers.co.za  This test is not yet approved or  cleared by the [place] FDA and has been authorized for detection and/or diagnosis of SARS-CoV-2 by FDA under an  Emergency Use Authorization (EUA).  This EUA will remain in effect (meaning this test can be used) for the duration of the COVID-19 declaration under Section 564(b)(1) of the Act, 21 U.S.C. section 360bbb-3(b)(1), unless the authorization is terminated or revoked sooner.  Performed at [facility], [address] [place], [place] [address]      Radiology Studies: DG Abd Portable 2V  Result Date: 12/25/2019 CLINICAL DATA:  Abdominal pain EXAM: PORTABLE ABDOMEN - 2 VIEW COMPARISON:  12/21/2019 FINDINGS: Multiple dilated loops of small bowel are again identified and stable. Areas noted within the colon consistent with a partial small bowel obstruction. No bony abnormality is noted. IMPRESSION: Diffuse small bowel dilatation consistent with persistent obstruction. Electronically Signed   By: Inez Catalina M.D.   On: 12/25/2019 15:30   Scheduled Meds: . amLODipine  10 mg Oral Daily  . bisacodyl  10 mg Rectal q morning - 10a  . hydrALAZINE  50 mg Oral Q8H  . lisinopril  20 mg Oral Daily  . metoprolol tartrate  100 mg Oral BID  . pantoprazole (PROTONIX) IV  40 mg Intravenous Q12H  . polyethylene glycol  17 g Oral Daily  . predniSONE  50 mg Oral Q6H   Continuous Infusions:   LOS: 5 days   Time spent: 23 mins    Heatherly Stenner Wynetta Emery, MD How to contact the [facility] Attending or Consulting provider [place] or covering provider during after hours [place], for this patient?  1. Check the care team in [facility] and look for a) attending/consulting TRH provider listed and b) the [facility] team listed 2. Log into [email or web address] and use [place]'s universal password to access. If you  do not have the password, please contact the hospital operator. 3. Locate the [facility] provider you are looking for under [facility] and page to a number that you can be directly reached. 4. If you still have difficulty reaching the provider, please page the [facility] (Director on Call) for the Hospitalists listed on amion for assistance.  12/26/2019, 3:42 PM

## 2019-12-26 NOTE — Progress Notes (Signed)
MD notified of current BP. Pt had incontinent BM. Pt also crying and stating he has never been in the hospital for his birthday. The patient repeated please dont let me die in here several times.

## 2019-12-26 NOTE — Progress Notes (Signed)
MD notified of current BP 196/114. PRN med previously given, (see mar)

## 2019-12-26 NOTE — Progress Notes (Signed)
  Subjective: Patient had episodes of emesis yesterday.  He is having multiple loose bowel movements today.  He still has a decreased appetite.  Abdominal cramping is noted.  Objective: Vital signs in last 24 hours: Temp:  [98.6 F (37 C)-99 F (37.2 C)] 98.6 F (37 C) (06/24 0504) Pulse Rate:  [61-88] 77 (06/24 1345) Resp:  [16-20] 18 (06/24 1345) BP: (162-196)/(89-120) 171/109 (06/24 1345) SpO2:  [96 %-99 %] 98 % (06/24 1345) Last BM Date: 12/25/19  Intake/Output from previous day: No intake/output data recorded. Intake/Output this shift: No intake/output data recorded.  General appearance: alert, cooperative and no distress GI: Soft, mildly distended.  Bowel sounds active.  No rigidity is noted.  Lab Results:  Recent Labs    12/25/19 0558 12/26/19 0508  WBC 6.8 7.6  HGB 12.9* 13.9  HCT 41.4 42.6  PLT 382 437*   BMET Recent Labs    12/25/19 0558 12/26/19 0508  NA 137 138  K 3.5 3.3*  CL 101 100  CO2 26 24  GLUCOSE 108* 111*  BUN 16 19  CREATININE 1.42* 1.60*  CALCIUM 8.7* 9.1   PT/INR No results for input(s): LABPROT, INR in the last 72 hours.  Studies/Results: DG Abd Portable 2V  Result Date: 12/25/2019 CLINICAL DATA:  Abdominal pain EXAM: PORTABLE ABDOMEN - 2 VIEW COMPARISON:  12/21/2019 FINDINGS: Multiple dilated loops of small bowel are again identified and stable. Areas noted within the colon consistent with a partial small bowel obstruction. No bony abnormality is noted. IMPRESSION: Diffuse small bowel dilatation consistent with persistent obstruction. Electronically Signed   By: Inez Catalina M.D.   On: 12/25/2019 15:30    Anti-infectives: Anti-infectives (From admission, onward)   None      Assessment/Plan: Impression: Partial small bowel obstruction, slowly resolving but not completely.  Malignant hypertension. Plan: We will get CT scan of abdomen with oral contrast to further assess the GI tract.  Further management is pending those  results.  LOS: 5 days    Aviva Signs 12/26/2019

## 2019-12-26 NOTE — Plan of Care (Signed)
  Problem: Education: Goal: Knowledge of General Education information will improve Description: Including pain rating scale, medication(s)/side effects and non-pharmacologic comfort measures Outcome: Progressing   Problem: Health Behavior/Discharge Planning: Goal: Ability to manage health-related needs will improve Outcome: Progressing   Problem: Clinical Measurements: Goal: Will remain free from infection Outcome: Progressing   Problem: Elimination: Goal: Will not experience complications related to bowel motility Outcome: Progressing

## 2019-12-27 ENCOUNTER — Other Ambulatory Visit: Payer: Self-pay

## 2019-12-27 ENCOUNTER — Inpatient Hospital Stay (HOSPITAL_COMMUNITY): Payer: Medicaid - Out of State | Admitting: Anesthesiology

## 2019-12-27 ENCOUNTER — Encounter (HOSPITAL_COMMUNITY): Payer: Self-pay | Admitting: Family Medicine

## 2019-12-27 ENCOUNTER — Inpatient Hospital Stay (HOSPITAL_COMMUNITY): Payer: Medicaid - Out of State

## 2019-12-27 ENCOUNTER — Encounter (HOSPITAL_COMMUNITY)
Admission: EM | Disposition: A | Discharge status: Home / Self Care | Payer: Self-pay | Source: Home / Self Care | Attending: Family Medicine

## 2019-12-27 DIAGNOSIS — K5669 Other partial intestinal obstruction: Secondary | ICD-10-CM

## 2019-12-27 DIAGNOSIS — K56609 Unspecified intestinal obstruction, unspecified as to partial versus complete obstruction: Secondary | ICD-10-CM

## 2019-12-27 HISTORY — PX: LAPAROTOMY: SHX154

## 2019-12-27 LAB — COMPREHENSIVE METABOLIC PANEL
ALT: 20 U/L (ref 0–44)
AST: 16 U/L (ref 15–41)
Albumin: 3.6 g/dL (ref 3.5–5.0)
Alkaline Phosphatase: 72 U/L (ref 38–126)
Anion gap: 11 (ref 5–15)
BUN: 22 mg/dL — ABNORMAL HIGH (ref 6–20)
CO2: 20 mmol/L — ABNORMAL LOW (ref 22–32)
Calcium: 9 mg/dL (ref 8.9–10.3)
Chloride: 102 mmol/L (ref 98–111)
Creatinine, Ser: 1.65 mg/dL — ABNORMAL HIGH (ref 0.61–1.24)
GFR calc Af Amer: 53 mL/min — ABNORMAL LOW (ref >60)
GFR calc non Af Amer: 46 mL/min — ABNORMAL LOW (ref >60)
Glucose, Bld: 134 mg/dL — ABNORMAL HIGH (ref 70–99)
Potassium: 3.9 mmol/L (ref 3.5–5.1)
Sodium: 133 mmol/L — ABNORMAL LOW (ref 135–145)
Total Bilirubin: 0.7 mg/dL (ref 0.3–1.2)
Total Protein: 7.2 g/dL (ref 6.5–8.1)

## 2019-12-27 LAB — MAGNESIUM: Magnesium: 2.1 mg/dL (ref 1.7–2.4)

## 2019-12-27 SURGERY — LAPAROTOMY, EXPLORATORY
Anesthesia: General | Site: Abdomen

## 2019-12-27 MED ORDER — PROPOFOL 10 MG/ML IV BOLUS
INTRAVENOUS | Status: AC
  Filled 2019-12-27: qty 20

## 2019-12-27 MED ORDER — MIDAZOLAM HCL 5 MG/5ML IJ SOLN
INTRAMUSCULAR | Status: DC | PRN
  Administered 2019-12-27: 2 mg via INTRAVENOUS

## 2019-12-27 MED ORDER — KETOROLAC TROMETHAMINE 30 MG/ML IJ SOLN: 30.0000 mg | Freq: Four times a day (QID) | INTRAMUSCULAR | Status: DC | PRN

## 2019-12-27 MED ORDER — MIDAZOLAM HCL 2 MG/2ML IJ SOLN
INTRAMUSCULAR | Status: AC
  Filled 2019-12-27: qty 2

## 2019-12-27 MED ORDER — LORAZEPAM 2 MG/ML IJ SOLN: 1.0000 mg | INTRAMUSCULAR | Status: DC | PRN

## 2019-12-27 MED ORDER — ROCURONIUM BROMIDE 100 MG/10ML IV SOLN
INTRAVENOUS | Status: DC | PRN
  Administered 2019-12-27: 60 mg via INTRAVENOUS

## 2019-12-27 MED ORDER — PROMETHAZINE HCL 25 MG/ML IJ SOLN
6.2500 mg | INTRAMUSCULAR | Status: DC | PRN
  Administered 2019-12-27: 12.5 mg via INTRAVENOUS
  Filled 2019-12-27: qty 1

## 2019-12-27 MED ORDER — ENOXAPARIN SODIUM 40 MG/0.4ML ~~LOC~~ SOLN
40.0000 mg | SUBCUTANEOUS | Status: DC
  Administered 2019-12-29 – 2019-12-31 (×2): 40 mg via SUBCUTANEOUS
  Filled 2019-12-27 (×4): qty 0.4

## 2019-12-27 MED ORDER — ORAL CARE MOUTH RINSE: 15.0000 mL | Freq: Once | OROMUCOSAL | Status: DC

## 2019-12-27 MED ORDER — MEPERIDINE HCL 50 MG/ML IJ SOLN: 6.2500 mg | INTRAMUSCULAR | Status: DC | PRN

## 2019-12-27 MED ORDER — OXYCODONE-ACETAMINOPHEN 5-325 MG PO TABS
1.0000 | ORAL_TABLET | ORAL | Status: DC | PRN
  Administered 2019-12-28: 1 via ORAL
  Administered 2019-12-29: 2 via ORAL
  Administered 2019-12-30: 1 via ORAL
  Administered 2019-12-30 – 2019-12-31 (×3): 2 via ORAL
  Filled 2019-12-27 (×2): qty 2
  Filled 2019-12-27: qty 1
  Filled 2019-12-27 (×4): qty 2

## 2019-12-27 MED ORDER — FENTANYL CITRATE (PF) 250 MCG/5ML IJ SOLN
INTRAMUSCULAR | Status: AC
  Filled 2019-12-27: qty 5

## 2019-12-27 MED ORDER — CHLORHEXIDINE GLUCONATE CLOTH 2 % EX PADS
6.0000 | MEDICATED_PAD | Freq: Every day | CUTANEOUS | Status: DC
  Administered 2019-12-27: 6 via TOPICAL

## 2019-12-27 MED ORDER — SODIUM CHLORIDE 0.9 % IV SOLN
2.0000 g | INTRAVENOUS | Status: AC
  Administered 2019-12-27: 2 g via INTRAVENOUS
  Filled 2019-12-27 (×2): qty 2

## 2019-12-27 MED ORDER — ACETAMINOPHEN 325 MG PO TABS
650.0000 mg | ORAL_TABLET | Freq: Four times a day (QID) | ORAL | Status: DC | PRN
  Administered 2019-12-29: 650 mg via ORAL
  Filled 2019-12-27 (×2): qty 2

## 2019-12-27 MED ORDER — ONDANSETRON HCL 4 MG/2ML IJ SOLN
INTRAMUSCULAR | Status: DC | PRN
  Administered 2019-12-27: 4 mg via INTRAVENOUS

## 2019-12-27 MED ORDER — SUCCINYLCHOLINE CHLORIDE 200 MG/10ML IV SOSY
PREFILLED_SYRINGE | INTRAVENOUS | Status: DC | PRN
  Administered 2019-12-27: 120 mg via INTRAVENOUS

## 2019-12-27 MED ORDER — CHLORHEXIDINE GLUCONATE CLOTH 2 % EX PADS: 6.0000 | MEDICATED_PAD | Freq: Once | CUTANEOUS | Status: DC

## 2019-12-27 MED ORDER — IOHEXOL 300 MG/ML  SOLN
80.0000 mL | Freq: Once | INTRAMUSCULAR | Status: AC | PRN
  Administered 2019-12-27: 80 mL via INTRAVENOUS

## 2019-12-27 MED ORDER — SIMETHICONE 80 MG PO CHEW: 40.0000 mg | CHEWABLE_TABLET | Freq: Four times a day (QID) | ORAL | Status: DC | PRN

## 2019-12-27 MED ORDER — ACETAMINOPHEN 650 MG RE SUPP: 650.0000 mg | Freq: Four times a day (QID) | RECTAL | Status: DC | PRN

## 2019-12-27 MED ORDER — EPHEDRINE SULFATE-NACL 50-0.9 MG/10ML-% IV SOSY
PREFILLED_SYRINGE | INTRAVENOUS | Status: DC | PRN
  Administered 2019-12-27: 5 mg via INTRAVENOUS

## 2019-12-27 MED ORDER — HYDROMORPHONE HCL 1 MG/ML IJ SOLN
1.0000 mg | INTRAMUSCULAR | Status: DC | PRN
  Administered 2019-12-27 – 2019-12-30 (×22): 1 mg via INTRAVENOUS
  Filled 2019-12-27 (×23): qty 1

## 2019-12-27 MED ORDER — LACTATED RINGERS IV BOLUS: 1000.0000 mL | Freq: Once | INTRAVENOUS | Status: DC

## 2019-12-27 MED ORDER — HYDROMORPHONE HCL 1 MG/ML IJ SOLN
0.2500 mg | INTRAMUSCULAR | Status: DC | PRN
  Administered 2019-12-27 (×3): 0.5 mg via INTRAVENOUS
  Filled 2019-12-27 (×3): qty 0.5

## 2019-12-27 MED ORDER — ONDANSETRON HCL 4 MG/2ML IJ SOLN
INTRAMUSCULAR | Status: AC
  Filled 2019-12-27: qty 2

## 2019-12-27 MED ORDER — LACTATED RINGERS IV SOLN: Freq: Once | INTRAVENOUS | Status: AC

## 2019-12-27 MED ORDER — FENTANYL CITRATE (PF) 100 MCG/2ML IJ SOLN
INTRAMUSCULAR | Status: AC
  Filled 2019-12-27: qty 2

## 2019-12-27 MED ORDER — POVIDONE-IODINE 10 % EX OINT
TOPICAL_OINTMENT | CUTANEOUS | Status: AC
  Filled 2019-12-27: qty 1

## 2019-12-27 MED ORDER — PROPOFOL 10 MG/ML IV BOLUS
INTRAVENOUS | Status: DC | PRN
  Administered 2019-12-27: 200 mg via INTRAVENOUS

## 2019-12-27 MED ORDER — LACTATED RINGERS IV SOLN
INTRAVENOUS | Status: DC | PRN
Start: 2019-12-27 — End: 2019-12-27

## 2019-12-27 MED ORDER — CHLORHEXIDINE GLUCONATE 0.12 % MT SOLN: 15.0000 mL | Freq: Once | OROMUCOSAL | Status: DC

## 2019-12-27 MED ORDER — FENTANYL CITRATE (PF) 100 MCG/2ML IJ SOLN
INTRAMUSCULAR | Status: DC | PRN
  Administered 2019-12-27: 150 ug via INTRAVENOUS
  Administered 2019-12-27 (×2): 100 ug via INTRAVENOUS

## 2019-12-27 MED ORDER — LACTATED RINGERS BOLUS PEDS: 1000.0000 mL | Freq: Once | INTRAVENOUS | Status: DC

## 2019-12-27 MED ORDER — BUPIVACAINE LIPOSOME 1.3 % IJ SUSP
INTRAMUSCULAR | Status: DC | PRN
  Administered 2019-12-27: 20 mL

## 2019-12-27 MED ORDER — SODIUM CHLORIDE 0.9 % IR SOLN
Status: DC | PRN
  Administered 2019-12-27: 2000 mL

## 2019-12-27 MED ORDER — POVIDONE-IODINE 10 % OINT PACKET
TOPICAL_OINTMENT | CUTANEOUS | Status: DC | PRN
  Administered 2019-12-27: 1 via TOPICAL

## 2019-12-27 MED ORDER — KETOROLAC TROMETHAMINE 30 MG/ML IJ SOLN
30.0000 mg | Freq: Four times a day (QID) | INTRAMUSCULAR | Status: AC
  Administered 2019-12-27: 30 mg via INTRAVENOUS
  Filled 2019-12-27: qty 1

## 2019-12-27 MED ORDER — SODIUM CHLORIDE FLUSH 0.9 % IV SOLN
INTRAVENOUS | Status: AC
  Filled 2019-12-27: qty 10

## 2019-12-27 MED ORDER — PHENYLEPHRINE 40 MCG/ML (10ML) SYRINGE FOR IV PUSH (FOR BLOOD PRESSURE SUPPORT)
PREFILLED_SYRINGE | INTRAVENOUS | Status: DC | PRN
  Administered 2019-12-27 (×3): 80 ug via INTRAVENOUS

## 2019-12-27 MED ORDER — LACTATED RINGERS IV SOLN: INTRAVENOUS | Status: DC

## 2019-12-27 SURGICAL SUPPLY — 61 items
APPLIER CLIP 11 MED OPEN (CLIP)
APPLIER CLIP 13 LRG OPEN (CLIP)
BARRIER SKIN 2 3/4 (OSTOMY) IMPLANT
BARRIER SKIN OD2.25 2 3/4 FLNG (OSTOMY) IMPLANT
CELLS DAT CNTRL 66122 CELL SVR (MISCELLANEOUS) IMPLANT
CHLORAPREP W/TINT 26 (MISCELLANEOUS) ×2 IMPLANT
CLAMP POUCH DRAINAGE QUIET (OSTOMY) IMPLANT
CLIP APPLIE 11 MED OPEN (CLIP) IMPLANT
CLIP APPLIE 13 LRG OPEN (CLIP) IMPLANT
CLOTH BEACON ORANGE TIMEOUT ST (SAFETY) ×2 IMPLANT
COVER LIGHT HANDLE STERIS (MISCELLANEOUS) ×4 IMPLANT
COVER WAND RF STERILE (DRAPES) ×2 IMPLANT
DRAPE WARM FLUID 44X44 (DRAPES) ×2 IMPLANT
DRSG OPSITE POSTOP 4X10 (GAUZE/BANDAGES/DRESSINGS) ×2 IMPLANT
ELECT BLADE 6 FLAT ULTRCLN (ELECTRODE) IMPLANT
ELECT REM PT RETURN 9FT ADLT (ELECTROSURGICAL) ×2
ELECTRODE REM PT RTRN 9FT ADLT (ELECTROSURGICAL) ×1 IMPLANT
GLOVE BIOGEL PI IND STRL 7.0 (GLOVE) ×2 IMPLANT
GLOVE BIOGEL PI INDICATOR 7.0 (GLOVE) ×2
GLOVE ECLIPSE 7.0 STRL STRAW (GLOVE) ×2 IMPLANT
GLOVE SURG SS PI 7.5 STRL IVOR (GLOVE) ×2 IMPLANT
GOWN STRL REUS W/TWL LRG LVL3 (GOWN DISPOSABLE) ×6 IMPLANT
HANDLE SUCTION POOLE (INSTRUMENTS) IMPLANT
INST SET MAJOR GENERAL (KITS) ×2 IMPLANT
KIT TURNOVER KIT A (KITS) ×2 IMPLANT
LIGASURE IMPACT 36 18CM CVD LR (INSTRUMENTS) ×3 IMPLANT
MANIFOLD NEPTUNE II (INSTRUMENTS) ×2 IMPLANT
NDL HYPO 18GX1.5 BLUNT FILL (NEEDLE) ×1 IMPLANT
NEEDLE HYPO 18GX1.5 BLUNT FILL (NEEDLE) ×2 IMPLANT
NEEDLE HYPO 22GX1.5 SAFETY (NEEDLE) ×2 IMPLANT
NS IRRIG 1000ML POUR BTL (IV SOLUTION) ×4 IMPLANT
PACK MAJOR ABDOMINAL (CUSTOM PROCEDURE TRAY) ×2 IMPLANT
PAD ARMBOARD 7.5X6 YLW CONV (MISCELLANEOUS) ×2 IMPLANT
POUCH OSTOMY 2 3/4  H 3804 (WOUND CARE)
POUCH OSTOMY 2 PC DRNBL 2.75 (WOUND CARE) IMPLANT
RELOAD LINEAR CUT PROX 55 BLUE (ENDOMECHANICALS) IMPLANT
RELOAD PROXIMATE 75MM BLUE (ENDOMECHANICALS) IMPLANT
RELOAD STAPLE 55 3.8 BLU REG (ENDOMECHANICALS) IMPLANT
RELOAD STAPLE 75 3.8 BLU REG (ENDOMECHANICALS) IMPLANT
RETRACTOR WND ALEXIS 18 MED (MISCELLANEOUS) ×1 IMPLANT
RETRACTOR WND ALEXIS 25 LRG (MISCELLANEOUS) IMPLANT
RETRACTOR WND ALEXIS-O 25 LRG (MISCELLANEOUS) IMPLANT
RTRCTR WOUND ALEXIS 18CM MED (MISCELLANEOUS)
RTRCTR WOUND ALEXIS 25CM LRG (MISCELLANEOUS)
RTRCTR WOUND ALEXIS O 25CM LRG (MISCELLANEOUS) ×2
SET BASIN LINEN APH (SET/KITS/TRAYS/PACK) ×2 IMPLANT
SPONGE LAP 18X18 RF (DISPOSABLE) ×2 IMPLANT
STAPLER GUN LINEAR PROX 60 (STAPLE) IMPLANT
STAPLER PROXIMATE 55 BLUE (STAPLE) IMPLANT
STAPLER PROXIMATE 75MM BLUE (STAPLE) IMPLANT
STAPLER VISISTAT (STAPLE) ×2 IMPLANT
SUCTION POOLE HANDLE (INSTRUMENTS)
SUT CHROMIC 0 SH (SUTURE) IMPLANT
SUT CHROMIC 2 0 SH (SUTURE) IMPLANT
SUT CHROMIC 3 0 SH 27 (SUTURE) IMPLANT
SUT PDS AB 0 CTX 60 (SUTURE) ×2 IMPLANT
SUT SILK 2 0 (SUTURE)
SUT SILK 2-0 18XBRD TIE 12 (SUTURE) IMPLANT
SUT SILK 3 0 SH CR/8 (SUTURE) IMPLANT
SYR 20ML LL LF (SYRINGE) ×4 IMPLANT
TRAY FOLEY MTR SLVR 16FR STAT (SET/KITS/TRAYS/PACK) ×2 IMPLANT

## 2019-12-27 NOTE — Progress Notes (Signed)
From pacu at 1130.  ABD binder in place and honeycomb dressing to abd dry and intact.  Vitals stable.  Received scheduled toradol and later dilaudid prn.

## 2019-12-27 NOTE — Op Note (Signed)
Patient:  Ethan Schmidt  DOB:  11-Mar-1961  MRN:  [id number]   Preop Diagnosis: Small bowel obstruction  Postop Diagnosis: Same, adhesive disease  Procedure: Exploratory laparotomy, lysis of adhesions  Surgeon: Aviva Signs, MD  Anes: General endotracheal  Indications: Patient is a 59 year old black male who presents with a persistent small bowel obstruction.  The risks and benefits of the procedure including bleeding, infection, cardiopulmonary difficulties, and the possibility of a bowel resection were fully explained to the patient, who gave informed consent.  Procedure note: The patient was placed in supine position.  After induction of general endotracheal anesthesia, the abdomen was prepped and draped using usual sterile technique with ChloraPrep.  Surgical site confirmation was performed.  A midline incision was made from above the umbilicus to the umbilical level.  The peritoneal cavity was entered into without difficulty.  Free fluid was noted within the abdominal cavity.  The small bowel was then eviscerated from the abdominal cavity.  It was noted that a single adhesion of omentum across the base of a section of the proximal small bowel was lysed.  This appeared because a bowel obstruction in 2 areas of the small bowel.  The mesentery was edematous in this area, but there was no frank gangrene or perforation.  The bowel was not vascularly compromised.  Distal to this, the small bowel was decompressed.  Any remaining defects in the omentum were taken care of using the LigaSure.  The dilated small bowel was then decompressed up to the stomach.  Approximately 2 L of fluid was suctioned from the stomach.  The small bowel was then run from the ligament of Treitz to the terminal ileum.  No other obstructive lesions were noted.  The bowel was returned into the abdominal cavity in an orderly fashion.  The fascia was reapproximated using a looped 0 PDS running suture.  Exparel was instilled into  the surrounding wound.  The skin was closed using staples.  Betadine ointment and dry sterile dressings were applied.  All tape and needle counts were correct at the end of the procedure.  The patient was extubated in the operating room and transferred to PACU in stable condition.  Complications: None  EBL: 10cc  Specimen: Omentum

## 2019-12-27 NOTE — Anesthesia Postprocedure Evaluation (Signed)
Anesthesia Post Note  Patient: Ethan Schmidt  Procedure(s) Performed: EXPLORATORY LAPAROTOMY WITH LYSIS OF ADHESIONS (N/A Abdomen)  Patient location during evaluation: PACU Anesthesia Type: General Level of consciousness: awake and alert Pain management: pain level controlled Vital Signs Assessment: post-procedure vital signs reviewed and stable Respiratory status: spontaneous breathing Cardiovascular status: stable Postop Assessment: no apparent nausea or vomiting Anesthetic complications: no   No complications documented.   Last Vitals:  Vitals:   12/27/19 1115 12/27/19 1138  BP: (!) 171/107 (!) 161/93  Pulse: 84 89  Resp: 13 16  Temp:  36.4 C  SpO2: 91% (!) 89%    Last Pain:  Vitals:   12/27/19 1138  TempSrc: Oral  PainSc:                  Diera Wirkkala Hristova

## 2019-12-27 NOTE — H&P (View-Only) (Signed)
CT scan of the abdomen reveals worsening and persistent small bowel obstruction with edema.  We will proceed with exploratory laparotomy.  The risks and benefits of the procedure including bleeding, infection, the possibility of bowel resection, and the possibility of recurrence of the obstruction were fully explained to the patient, who gave informed consent.

## 2019-12-27 NOTE — Progress Notes (Signed)
PROGRESS NOTE   Ethan Schmidt  XFG:[id number] DOB: August 27, 1960 DOA: 12/21/2019 PCP: Patient, No Pcp Per   Chief Complaint  Patient presents with  . Dizziness    Brief Admission History:  59 y.o.malewith a history of uncontrolled hypertension, stage III chronic kidney disease, asthma, arthritis. Patient seen for onset of severe stabbing abdominal pain around and above his umbilicus that started around 3 AM and has been worsening. He attempted to take Pepto-Bismol, which was not effective. He has had some dry heaves earlier today, but has not had anything now. Pain radiates into his back. His pain is improved with IV pain medicine. No other palliating or provoking factors. Denies diarrhea, constipation, fevers, chills. Denies chest pain.  He does have a history of drug use with reported last cocaine use about a week ago.  Assessment & Plan:   Principal Problem:   Intestinal obstruction (HCC) Active Problems:   Acute renal failure superimposed on stage 3 chronic kidney disease (HCC)   History of COVID-19   Asthma   Hypertension   Hypertensive urgency   Hypokalemia   Abdominal pain   SBO (small bowel obstruction) ([place])  1. SBO - POD #0 s/p Exp Lap with lysis of adhesions - continue postop care.    2. Poorly controlled malignant hypertension - continue current meds.   3. AKI on CKD stage 3a - creatinine improved.  4. Hypokalemia - repleted.  5. Asthma - bronchodilators as needed.    DVT prophylaxis:  SCD Code Status:  FULL  Family Communication:  Disposition: Home   Status is: Inpatient  Remains inpatient appropriate because:postop management from exp lap.   Dispo: The patient is from: Home              Anticipated d/c is to: Home              Anticipated d/c date is: 1-2 days              Patient currently is not medically stable to d/c.  Consultants:   Surgery   Procedures:     Antimicrobials:    Subjective: Pt seen after surgery, pain controlled.      Objective: Vitals:   12/27/19 1045 12/27/19 1100 12/27/19 1115 12/27/19 1138  BP: (!) 166/97 (!) 165/99 (!) 171/107 (!) 161/93  Pulse: 97 91 84 89  Resp: 15 17 13 16   Temp: 98 F (36.7 C)   97.6 F (36.4 C)  TempSrc:    Oral  SpO2: 94% 91% 91% (!) 89%  Weight:      Height:        Intake/Output Summary (Last 24 hours) at 12/27/2019 1510 Last data filed at 12/27/2019 1037 Gross per 24 hour  Intake 2740 ml  Output 470 ml  Net 2270 ml   Filed Weights   12/21/19 0946 12/27/19 0838  Weight: 104.3 kg 104.3 kg    Examination:  General exam: Appears calm and comfortable  Respiratory system: Clear to auscultation. Respiratory effort normal. Cardiovascular system: S1 & S2 heard, RRR. No JVD, murmurs, rubs, gallops or clicks. No pedal edema. Gastrointestinal system: Abdomen is soft, nondistended. No organomegaly or masses felt. Normal bowel sounds heard. Central nervous system: Alert and oriented. No focal neurological deficits. Extremities: Symmetric 5 x 5 power. Skin: No rashes, lesions or ulcers Psychiatry: Judgement and insight appear normal. Mood & affect appropriate.   Data Reviewed: I have personally reviewed following labs and imaging studies  CBC: Recent Labs  Lab 12/21/19 1011 12/21/19  1011 12/22/19 0646 12/23/19 0602 12/24/19 0611 12/25/19 0558 12/26/19 0508  WBC 12.5*   < > 20.5* 14.2* 9.3 6.8 7.6  NEUTROABS 10.0*  --   --   --   --   --  4.9  HGB 15.4   < > 17.0 14.5 13.2 12.9* 13.9  HCT 47.3   < > 53.6* 46.7 42.4 41.4 42.6  MCV 96.7   < > 100.6* 101.1* 101.4* 100.5* 98.2  PLT 469*   < > 477* 377 393 382 437*   < > = values in this interval not displayed.    Basic Metabolic Panel: Recent Labs  Lab 12/21/19 1011 12/21/19 1011 12/22/19 0704 12/22/19 0704 12/23/19 0602 12/24/19 0611 12/25/19 0558 12/26/19 0508 12/27/19 0515  NA 139   < > 141   < > 141 139 137 138 133*  K 3.1*   < > 3.5   < > 3.8 3.6 3.5 3.3* 3.9  CL 96*   < > 96*   < > 103 108  101 100 102  CO2 29   < > 27   < > 26 25 26 24  20*  GLUCOSE 168*   < > 134*   < > 102* 95 108* 111* 134*  BUN 24*   < > 29*   < > 23* 20 16 19  22*  CREATININE 2.43*   < > 2.19*   < > 1.62* 1.40* 1.42* 1.60* 1.65*  CALCIUM 10.2   < > 9.6   < > 8.8* 8.5* 8.7* 9.1 9.0  MG 2.2  --  2.2  --  2.1 2.1  --   --  2.1  PHOS  --   --   --   --  2.5  --   --   --   --    < > = values in this interval not displayed.    GFR: Estimated Creatinine Clearance: 64.3 mL/min (A) (by C-G formula based on SCr of 1.65 mg/dL (H)).  Liver Function Tests: Recent Labs  Lab 12/22/19 0704 12/23/19 0602 12/24/19 0611 12/26/19 0508 12/27/19 0515  AST 24 27 20 23 16   ALT 18 18 15 23 20   ALKPHOS 98 75 65 71 72  BILITOT 0.6 0.8 1.1 0.7 0.7  PROT 8.3* 7.2 6.7 7.1 7.2  ALBUMIN 4.0 3.7 3.3* 3.5 3.6    CBG: No results for input(s): GLUCAP in the last 168 hours.  Recent Results (from the past 240 hour(s))  SARS Coronavirus 2 by RT PCR (hospital order, performed in [facility] hospital lab) Nasopharyngeal Nasopharyngeal Swab     Status: None   Collection Time: 12/21/19  4:05 PM   Specimen: Nasopharyngeal Swab  Result Value Ref Range Status   SARS Coronavirus 2 NEGATIVE NEGATIVE Final    Comment: (NOTE) SARS-CoV-2 target nucleic acids are NOT DETECTED.  The SARS-CoV-2 RNA is generally detectable in upper and lower respiratory specimens during the acute phase of infection. The lowest concentration of SARS-CoV-2 viral copies this assay can detect is 250 copies / mL. A negative result does not preclude SARS-CoV-2 infection and should not be used as the sole basis for treatment or other patient management decisions.  A negative result may occur with improper specimen collection / handling, submission of specimen other than nasopharyngeal swab, presence of viral mutation(s) within the areas targeted by this assay, and inadequate number of viral copies (<250 copies / mL). A negative result must be combined with  clinical observations, patient history, and  epidemiological information.  Fact Sheet for Patients:   StrictlyIdeas.no  Fact Sheet for Healthcare Providers: BankingDealers.co.za  This test is not yet approved or  cleared by the [place] FDA and has been authorized for detection and/or diagnosis of SARS-CoV-2 by FDA under an Emergency Use Authorization (EUA).  This EUA will remain in effect (meaning this test can be used) for the duration of the COVID-19 declaration under Section 564(b)(1) of the Act, 21 U.S.C. section 360bbb-3(b)(1), unless the authorization is terminated or revoked sooner.  Performed at [facility], [address] [place], [place] [address]      Radiology Studies: CT ABDOMEN PELVIS W CONTRAST  Result Date: 12/27/2019 CLINICAL DATA:  Bowel obstruction suspected.  Abdominal pain. EXAM: CT ABDOMEN AND PELVIS WITH CONTRAST TECHNIQUE: Multidetector CT imaging of the abdomen and pelvis was performed using the standard protocol following bolus administration of intravenous contrast. CONTRAST:  64mL OMNIPAQUE IOHEXOL 300 MG/ML  SOLN COMPARISON:  Radiograph 12/25/2019. Chest and abdominal CTA 12/21/2019. FINDINGS: Lower chest: Similar streaky bilateral lung opacities. Small right and trace left pleural effusion. Compressive atelectasis in the right middle and lower lobe. Hepatobiliary: No focal hepatic abnormality. Gallbladder minimally distended. There is no biliary dilatation. Pancreas: No ductal dilatation or inflammation. Spleen: Normal in size without focal abnormality. Adrenals/Urinary Tract: No adrenal nodule. No hydronephrosis or perinephric edema. Homogeneous renal enhancement with symmetric excretion on delayed phase imaging. Urinary bladder is partially distended without wall thickening. Stomach/Bowel: Marked gastric distention with fluid and enteric contrast. Diffusely dilated and fluid-filled small bowel. Transition  point in the central abdomen, series 3, image 74. there is central mesenteric edema that has slightly progressed from prior. More distal small bowel are decompressed. No pneumatosis. Minimal liquid and solid stool in the colon. No abnormal colonic distention. Descending and sigmoid diverticulosis without diverticulitis. Vascular/Lymphatic: Mild aortic atherosclerosis. No aortic aneurysm. The portal vein is patent. No evidence of mesenteric or portal venous gas. No adenopathy. Reproductive: Prostate is unremarkable. Other: Small volume ascites in the upper abdomen and pelvis. Scattered mesenteric ascites. Mesenteric edema. There is no free air. Tiny fat containing umbilical hernia. Musculoskeletal: Degenerative change in the spine, unchanged from prior. There are no acute or suspicious osseous abnormalities. IMPRESSION: 1. Small bowel obstruction with transition point in the central abdomen, likely due to adhesions. There is progression and mesenteric edema and bowel distension from obstruction on CT 6 days ago. 2. Small volume ascites in the upper abdomen and pelvis. 3. Colonic diverticulosis without diverticulitis. 4. Small right and trace left pleural effusions are new. Aortic Atherosclerosis (ICD10-I70.0). Electronically Signed   By: Keith Rake M.D.   On: 12/27/2019 01:50   Scheduled Meds: . amLODipine  10 mg Oral Daily  . bisacodyl  10 mg Rectal q morning - 10a  . Chlorhexidine Gluconate Cloth  6 each Topical Daily  . [START ON 12/28/2019] enoxaparin (LOVENOX) injection  40 mg Subcutaneous Q24H  . hydrALAZINE  100 mg Oral Q8H  . lisinopril  20 mg Oral Daily  . metoprolol tartrate  100 mg Oral BID  . pantoprazole (PROTONIX) IV  40 mg Intravenous Q12H  . polyethylene glycol  17 g Oral Daily   Continuous Infusions: . lactated ringers    . lactated ringers 100 mL/hr at 12/27/19 1426     LOS: 6 days   Time spent: 15 mins    Arath Kaigler Wynetta Emery, MD How to contact the [facility] Attending or  Consulting provider [place] or covering provider during after hours 7P -  7A, for this patient?  1. Check the care team in [facility] and look for a) attending/consulting TR[facility] provider listed and b) the [facility] team listed 2. Log into [email or web address] and use [place]'s universal password to access. If you do not have the password, please contact the hospital operator. 3. Locate the [facility] provider you are looking for under [facility] and page to a number that you can be directly reached. 4. If you still have difficulty reaching the provider, please page the [facility] (Director on Call) for the Hospitalists listed on amion for assistance.  12/27/2019, 3:10 PM

## 2019-12-27 NOTE — Progress Notes (Signed)
CT scan of the abdomen reveals worsening and persistent small bowel obstruction with edema.  We will proceed with exploratory laparotomy.  The risks and benefits of the procedure including bleeding, infection, the possibility of bowel resection, and the possibility of recurrence of the obstruction were fully explained to the patient, who gave informed consent.

## 2019-12-27 NOTE — Transfer of Care (Signed)
Immediate Anesthesia Transfer of Care Note  Patient: Ethan Schmidt  Procedure(s) Performed: EXPLORATORY LAPAROTOMY WITH LYSIS OF ADHESIONS (N/A Abdomen)  Patient Location: PACU  Anesthesia Type:General  Level of Consciousness: awake, alert  and oriented  Airway & Oxygen Therapy: Patient Spontanous Breathing  Post-op Assessment: Report given to RN and Post -op Vital signs reviewed and stable  Post vital signs: Reviewed and stable  Last Vitals:  Vitals Value Taken Time  BP 166/97 12/27/19 1045  Temp    Pulse 93 12/27/19 1047  Resp 13 12/27/19 1047  SpO2 93 % 12/27/19 1047  Vitals shown include unvalidated device data.  Last Pain:  Vitals:   12/27/19 0838  TempSrc: Oral  PainSc: 0-No pain      Patients Stated Pain Goal: 0 (50/41/36 4383)  Complications: No complications documented.

## 2019-12-27 NOTE — TOC Progression Note (Signed)
Transition of Care [facility]) - Progression Note    Patient Details  Name: Ethan Schmidt MRN: [id number] Date of Birth: June 12, 1961  Transition of Care [facility]) CM/SW Contact  Salome Arnt, [place] Phone Number: 12/27/2019, 3:35 PM  Clinical Narrative:   LCSW noted pt does not have insurance. Discussed [facility] referral for PCP and pt agreeable. Referral made.     Expected Discharge Plan: Home/Self Care Barriers to Discharge: Continued Medical Work up  Expected Discharge Plan and Services Expected Discharge Plan: Home/Self Care In-house Referral: Clinical Social Work     Living arrangements for the past 2 months: Single Family Home                                       Social Determinants of Health (SDOH) Interventions    Readmission Risk Interventions Readmission Risk Prevention Plan 12/25/2019  Transportation Screening Complete  PCP or Specialist Appt within 3-5 Days Not Complete  HRI or [place] Complete  Social Work Consult for [place] Planning/Counseling Complete  Palliative Care Screening Not Applicable  Medication Review [employer] photographer) Complete  Some recent data might be hidden

## 2019-12-27 NOTE — Anesthesia Procedure Notes (Signed)
Procedure Name: Intubation Date/Time: 12/27/2019 9:32 AM Performed by: Schlarb Blade, CRNA Pre-anesthesia Checklist: Patient identified, Emergency Drugs available, Suction available and Patient being monitored Patient Re-evaluated:Patient Re-evaluated prior to induction Oxygen Delivery Method: Circle system utilized Preoxygenation: Pre-oxygenation with 100% oxygen Induction Type: IV induction, Rapid sequence and Cricoid Pressure applied Laryngoscope Size: Mac and 4 Grade View: Grade I Tube type: Oral Tube size: 7.5 mm Number of attempts: 1 Airway Equipment and Method: Stylet and Oral airway Placement Confirmation: ETT inserted through vocal cords under direct vision,  positive ETCO2 and breath sounds checked- equal and bilateral Secured at: 23 cm Tube secured with: Tape Dental Injury: Teeth and Oropharynx as per pre-operative assessment

## 2019-12-27 NOTE — Interval H&P Note (Signed)
History and Physical Interval Note:  12/27/2019 8:53 AM  Ethan Schmidt  has presented today for surgery, with the diagnosis of small bowel obstruction.  The various methods of treatment have been discussed with the patient and family. After consideration of risks, benefits and other options for treatment, the patient has consented to  Procedure(s): EXPLORATORY LAPAROTOMY (N/A) as a surgical intervention.  The patient's history has been reviewed, patient examined, no change in status, stable for surgery.  I have reviewed the patient's chart and labs.  Questions were answered to the patient's satisfaction.     Aviva Signs

## 2019-12-27 NOTE — Anesthesia Preprocedure Evaluation (Signed)
Anesthesia Evaluation  Patient identified by MRN, date of birth, ID band Patient awake    Reviewed: Allergy & Precautions, NPO status , Patient's Chart, lab work & pertinent test results  History of Anesthesia Complications Negative for: history of anesthetic complications  Airway Mallampati: III  TM Distance: >3 FB Neck ROM: Full    Dental  (+) Missing, Dental Advisory Given   Pulmonary asthma , pneumonia (covid positive - 11/2019), resolved,    Pulmonary exam normal breath sounds clear to auscultation       Cardiovascular hypertension, Pt. on medications Normal cardiovascular exam Rhythm:Regular Rate:Normal  21-Dec-2019 09:49:13 [facility] ROUTINE RECORD Sinus rhythm LAE, consider biatrial enlargement Probable left ventricular hypertrophy Anterior ST elevation, probably due to LVH Borderline prolonged QT interval No sig change from prior ecg, no STEMI Confirmed by Octaviano Glow [phone]) on 12/21/2019 9:55:11 AM   Neuro/Psych negative neurological ROS  negative psych ROS   GI/Hepatic (+)     substance abuse  alcohol use, cocaine use and marijuana use, Small bowel obstruction    Endo/Other  negative endocrine ROS  Renal/GU Renal InsufficiencyRenal disease (AKI on CRI)     Musculoskeletal  (+) Arthritis ,   Abdominal   Peds  Hematology negative hematology ROS (+)   Anesthesia Other Findings   Reproductive/Obstetrics                            Anesthesia Physical Anesthesia Plan  ASA: III and emergent  Anesthesia Plan: General   Post-op Pain Management:    Induction: Intravenous, Rapid sequence and Cricoid pressure planned  PONV Risk Score and Plan: 4 or greater and Ondansetron, Dexamethasone, Midazolam and Treatment may vary due to age or medical condition  Airway Management Planned: Oral ETT  Additional Equipment:   Intra-op Plan:   Post-operative  Plan: Possible Post-op intubation/ventilation  Informed Consent:     Dental advisory given  Plan Discussed with: CRNA and Surgeon  Anesthesia Plan Comments: (Risk of worsening kidney function was explained.)      Anesthesia Quick Evaluation

## 2019-12-28 LAB — CBC
HCT: 42.8 % (ref 39.0–52.0)
Hemoglobin: 13.5 g/dL (ref 13.0–17.0)
MCH: 31.4 pg (ref 26.0–34.0)
MCHC: 31.5 g/dL (ref 30.0–36.0)
MCV: 99.5 fL (ref 80.0–100.0)
Platelets: 507 10*3/uL — ABNORMAL HIGH (ref 150–400)
RBC: 4.3 MIL/uL (ref 4.22–5.81)
RDW: 13.7 % (ref 11.5–15.5)
WBC: 7.1 10*3/uL (ref 4.0–10.5)
nRBC: 0 % (ref 0.0–0.2)

## 2019-12-28 LAB — COMPREHENSIVE METABOLIC PANEL
ALT: 21 U/L (ref 0–44)
AST: 22 U/L (ref 15–41)
Albumin: 3.2 g/dL — ABNORMAL LOW (ref 3.5–5.0)
Alkaline Phosphatase: 58 U/L (ref 38–126)
Anion gap: 11 (ref 5–15)
BUN: 25 mg/dL — ABNORMAL HIGH (ref 6–20)
CO2: 22 mmol/L (ref 22–32)
Calcium: 8.7 mg/dL — ABNORMAL LOW (ref 8.9–10.3)
Chloride: 105 mmol/L (ref 98–111)
Creatinine, Ser: 1.96 mg/dL — ABNORMAL HIGH (ref 0.61–1.24)
GFR calc Af Amer: 43 mL/min — ABNORMAL LOW (ref >60)
GFR calc non Af Amer: 37 mL/min — ABNORMAL LOW (ref >60)
Glucose, Bld: 88 mg/dL (ref 70–99)
Potassium: 3.6 mmol/L (ref 3.5–5.1)
Sodium: 138 mmol/L (ref 135–145)
Total Bilirubin: 0.6 mg/dL (ref 0.3–1.2)
Total Protein: 6.1 g/dL — ABNORMAL LOW (ref 6.5–8.1)

## 2019-12-28 LAB — PHOSPHORUS: Phosphorus: 2.8 mg/dL (ref 2.5–4.6)

## 2019-12-28 LAB — MAGNESIUM: Magnesium: 1.9 mg/dL (ref 1.7–2.4)

## 2019-12-28 MED ORDER — PANTOPRAZOLE SODIUM 40 MG PO TBEC
40.0000 mg | DELAYED_RELEASE_TABLET | Freq: Every day | ORAL | Status: DC
  Administered 2019-12-28 – 2019-12-31 (×4): 40 mg via ORAL
  Filled 2019-12-28 (×3): qty 1

## 2019-12-28 MED ORDER — HYDRALAZINE HCL 25 MG PO TABS
50.0000 mg | ORAL_TABLET | Freq: Three times a day (TID) | ORAL | Status: DC
  Administered 2019-12-28 – 2019-12-29 (×3): 50 mg via ORAL
  Filled 2019-12-28 (×3): qty 2

## 2019-12-28 NOTE — Plan of Care (Signed)

## 2019-12-28 NOTE — Addendum Note (Signed)
Addendum  created 12/28/19 1741 by Denese Killings, MD   Clinical Note Signed

## 2019-12-28 NOTE — Progress Notes (Signed)
1 Day Post-Op  Subjective: Patient having mild incisional pain.  Otherwise feeling comfortable.  No nausea or vomiting is noted.  Objective: Vital signs in last 24 hours: Temp:  [97.6 F (36.4 C)-99 F (37.2 C)] 99 F (37.2 C) (06/26 0502) Pulse Rate:  [61-97] 72 (06/26 0502) Resp:  [13-20] 16 (06/26 0502) BP: (106-171)/(67-107) 133/85 (06/26 0502) SpO2:  [89 %-97 %] 97 % (06/26 0502) Last BM Date: 12/26/19  Intake/Output from previous day: 06/25 0701 - 06/26 0700 In: 4865.6 [I.V.:3865.6; IV Piggyback:1000] Out: 970 [Urine:960; Blood:10] Intake/Output this shift: No intake/output data recorded.  General appearance: alert, cooperative and no distress Resp: clear to auscultation bilaterally Cardio: regular rate and rhythm, S1, S2 normal, no murmur, click, rub or gallop GI: Soft, dressing dry and intact.  Abdominal binder in place.  Occasional bowel sounds appreciated.  Lab Results:  Recent Labs    12/26/19 0508 12/28/19 0624  WBC 7.6 7.1  HGB 13.9 13.5  HCT 42.6 42.8  PLT 437* 507*   BMET Recent Labs    12/27/19 0515 12/28/19 0624  NA 133* 138  K 3.9 3.6  CL 102 105  CO2 20* 22  GLUCOSE 134* 88  BUN 22* 25*  CREATININE 1.65* 1.96*  CALCIUM 9.0 8.7*   PT/INR No results for input(s): LABPROT, INR in the last 72 hours.  Studies/Results: CT ABDOMEN PELVIS W CONTRAST  Result Date: 12/27/2019 CLINICAL DATA:  Bowel obstruction suspected.  Abdominal pain. EXAM: CT ABDOMEN AND PELVIS WITH CONTRAST TECHNIQUE: Multidetector CT imaging of the abdomen and pelvis was performed using the standard protocol following bolus administration of intravenous contrast. CONTRAST:  45mL OMNIPAQUE IOHEXOL 300 MG/ML  SOLN COMPARISON:  Radiograph 12/25/2019. Chest and abdominal CTA 12/21/2019. FINDINGS: Lower chest: Similar streaky bilateral lung opacities. Small right and trace left pleural effusion. Compressive atelectasis in the right middle and lower lobe. Hepatobiliary: No focal  hepatic abnormality. Gallbladder minimally distended. There is no biliary dilatation. Pancreas: No ductal dilatation or inflammation. Spleen: Normal in size without focal abnormality. Adrenals/Urinary Tract: No adrenal nodule. No hydronephrosis or perinephric edema. Homogeneous renal enhancement with symmetric excretion on delayed phase imaging. Urinary bladder is partially distended without wall thickening. Stomach/Bowel: Marked gastric distention with fluid and enteric contrast. Diffusely dilated and fluid-filled small bowel. Transition point in the central abdomen, series 3, image 74. there is central mesenteric edema that has slightly progressed from prior. More distal small bowel are decompressed. No pneumatosis. Minimal liquid and solid stool in the colon. No abnormal colonic distention. Descending and sigmoid diverticulosis without diverticulitis. Vascular/Lymphatic: Mild aortic atherosclerosis. No aortic aneurysm. The portal vein is patent. No evidence of mesenteric or portal venous gas. No adenopathy. Reproductive: Prostate is unremarkable. Other: Small volume ascites in the upper abdomen and pelvis. Scattered mesenteric ascites. Mesenteric edema. There is no free air. Tiny fat containing umbilical hernia. Musculoskeletal: Degenerative change in the spine, unchanged from prior. There are no acute or suspicious osseous abnormalities. IMPRESSION: 1. Small bowel obstruction with transition point in the central abdomen, likely due to adhesions. There is progression and mesenteric edema and bowel distension from obstruction on CT 6 days ago. 2. Small volume ascites in the upper abdomen and pelvis. 3. Colonic diverticulosis without diverticulitis. 4. Small right and trace left pleural effusions are new. Aortic Atherosclerosis (ICD10-I70.0). Electronically Signed   By: Keith Rake M.D.   On: 12/27/2019 01:50    Anti-infectives: Anti-infectives (From admission, onward)   Start     Dose/Rate Route  Frequency Ordered  Stop   12/27/19 0800  cefoTEtan (CEFOTAN) 2 g in sodium chloride 0.9 % 100 mL IVPB        2 g 200 mL/hr over 30 Minutes Intravenous On call to O.R. 12/27/19 0751 12/27/19 0927      Assessment/Plan: s/p Procedure(s): EXPLORATORY LAPAROTOMY WITH LYSIS OF ADHESIONS Impression: Stable on postoperative day 1.  Blood pressure under much better control.  Will advance to soft diet.  Will get patient out of bed.  LOS: 7 days    [facility]iva Signs 12/28/2019

## 2019-12-28 NOTE — Progress Notes (Signed)
PROGRESS NOTE   Ethan Schmidt  UKG:[id number] DOB: 1960/07/14 DOA: 12/21/2019 PCP: Patient, No Pcp Per   Chief Complaint  Patient presents with  . Dizziness    Brief Admission History:  59 y.o.malewith a history of uncontrolled hypertension, stage III chronic kidney disease, asthma, arthritis. Patient seen for onset of severe stabbing abdominal pain around and above his umbilicus that started around 3 AM and has been worsening. He attempted to take Pepto-Bismol, which was not effective. He has had some dry heaves earlier today, but has not had anything now. Pain radiates into his back. His pain is improved with IV pain medicine. No other palliating or provoking factors. Denies diarrhea, constipation, fevers, chills. Denies chest pain.  He does have a history of drug use with reported last cocaine use about a week ago.  Assessment & Plan:   Principal Problem:   Intestinal obstruction (HCC) Active Problems:   Acute renal failure superimposed on stage 3 chronic kidney disease (HCC)   History of COVID-19   Asthma   Hypertension   Hypertensive urgency   Hypokalemia   Abdominal pain   SBO (small bowel obstruction) ([place])  1. SBO - POD #1 s/p Exp Lap with lysis of adhesions - diet being advanced today, he is feeling better, still requiring IV pain meds today, continue postop care.    2. Malignant hypertension - BPs have come down since surgery, I have cut back on BP meds to try to avoid hypotension.   3. AKI on CKD stage 3a - creatinine stable.  4. Hypokalemia - repleted.  5. Asthma - bronchodilators as needed.    DVT prophylaxis:  SCD Code Status:  FULL  Family Communication:  Disposition: Home   Status is: Inpatient  Remains inpatient appropriate because:postop management from exp lap. Pending clinical improvement and surgery sign off.   Dispo: The patient is from: Home              Anticipated d/c is to: Home              Anticipated d/c date is: 1-2 days               Patient currently is not medically stable to d/c.  Consultants:   Surgery   Procedures:     Antimicrobials:    Subjective: Pt seen after surgery, pain controlled.     Objective: Vitals:   12/27/19 1957 12/27/19 2121 12/28/19 0102 12/28/19 0502  BP:  119/67 118/78 133/85  Pulse: 94 82 61 72  Resp: 20 16 16 16   Temp:  98.6 F (37 C) 98.8 F (37.1 C) 99 F (37.2 C)  TempSrc:  Oral Oral Oral  SpO2: 94% 96% 96% 97%  Weight:      Height:        Intake/Output Summary (Last 24 hours) at 12/28/2019 1209 Last data filed at 12/28/2019 0500 Gross per 24 hour  Intake 2365.64 ml  Output 900 ml  Net 1465.64 ml   Filed Weights   12/21/19 0946 12/27/19 0838  Weight: 104.3 kg 104.3 kg    Examination:  General exam: Appears calm and comfortable  Respiratory system: Clear to auscultation. Respiratory effort normal. Cardiovascular system: S1 & S2 heard, RRR. No JVD, murmurs, rubs, gallops or clicks. No pedal edema. Gastrointestinal system: Abdomen is soft, nondistended. No organomegaly or masses felt. Normal bowel sounds heard. Central nervous system: Alert and oriented. No focal neurological deficits. Extremities: Symmetric 5 x 5 power. Skin: No rashes, lesions or ulcers  Psychiatry: Judgement and insight appear normal. Mood & affect appropriate.   Data Reviewed: I have personally reviewed following labs and imaging studies  CBC: Recent Labs  Lab 12/23/19 0602 12/24/19 0611 12/25/19 0558 12/26/19 0508 12/28/19 0624  WBC 14.2* 9.3 6.8 7.6 7.1  NEUTROABS  --   --   --  4.9  --   HGB 14.5 13.2 12.9* 13.9 13.5  HCT 46.7 42.4 41.4 42.6 42.8  MCV 101.1* 101.4* 100.5* 98.2 99.5  PLT 377 393 382 437* 507*    Basic Metabolic Panel: Recent Labs  Lab 12/22/19 0704 12/22/19 0704 12/23/19 0602 12/23/19 0602 12/24/19 5456 12/25/19 0558 12/26/19 0508 12/27/19 0515 12/28/19 0624  NA 141   < > 141   < > 139 137 138 133* 138  K 3.5   < > 3.8   < > 3.6 3.5 3.3* 3.9 3.6   CL 96*   < > 103   < > 108 101 100 102 105  CO2 27   < > 26   < > 25 26 24  20* 22  GLUCOSE 134*   < > 102*   < > 95 108* 111* 134* 88  BUN 29*   < > 23*   < > 20 16 19  22* 25*  CREATININE 2.19*   < > 1.62*   < > 1.40* 1.42* 1.60* 1.65* 1.96*  CALCIUM 9.6   < > 8.8*   < > 8.5* 8.7* 9.1 9.0 8.7*  MG 2.2  --  2.1  --  2.1  --   --  2.1 1.9  PHOS  --   --  2.5  --   --   --   --   --  2.8   < > = values in this interval not displayed.    GFR: Estimated Creatinine Clearance: 53.5 mL/min (A) (by C-G formula based on SCr of 1.96 mg/dL (H)).  Liver Function Tests: Recent Labs  Lab 12/23/19 0602 12/24/19 0611 12/26/19 0508 12/27/19 0515 12/28/19 0624  AST 27 20 23 16 22   ALT 18 15 23 20 21   ALKPHOS 75 65 71 72 58  BILITOT 0.8 1.1 0.7 0.7 0.6  PROT 7.2 6.7 7.1 7.2 6.1*  ALBUMIN 3.7 3.3* 3.5 3.6 3.2*    CBG: No results for input(s): GLUCAP in the last 168 hours.  Recent Results (from the past 240 hour(s))  SARS Coronavirus 2 by RT PCR (hospital order, performed in [facility] hospital lab) Nasopharyngeal Nasopharyngeal Swab     Status: None   Collection Time: 12/21/19  4:05 PM   Specimen: Nasopharyngeal Swab  Result Value Ref Range Status   SARS Coronavirus 2 NEGATIVE NEGATIVE Final    Comment: (NOTE) SARS-CoV-2 target nucleic acids are NOT DETECTED.  The SARS-CoV-2 RNA is generally detectable in upper and lower respiratory specimens during the acute phase of infection. The lowest concentration of SARS-CoV-2 viral copies this assay can detect is 250 copies / mL. A negative result does not preclude SARS-CoV-2 infection and should not be used as the sole basis for treatment or other patient management decisions.  A negative result may occur with improper specimen collection / handling, submission of specimen other than nasopharyngeal swab, presence of viral mutation(s) within the areas targeted by this assay, and inadequate number of viral copies (<250 copies / mL). A negative  result must be combined with clinical observations, patient history, and epidemiological information.  Fact Sheet for Patients:   StrictlyIdeas.no  Fact Sheet for  Healthcare Providers: BankingDealers.co.za  This test is not yet approved or  cleared by the [place] and has been authorized for detection and/or diagnosis of SARS-CoV-2 by FDA under an Emergency Use Authorization (EUA).  This EUA will remain in effect (meaning this test can be used) for the duration of the COVID-19 declaration under Section 564(b)(1) of the Act, 21 U.S.C. section 360bbb-3(b)(1), unless the authorization is terminated or revoked sooner.  Performed at [facility], [address], [place], [place] [address]      Radiology Studies: CT ABDOMEN PELVIS W CONTRAST  Result Date: 12/27/2019 CLINICAL DATA:  Bowel obstruction suspected.  Abdominal pain. EXAM: CT ABDOMEN AND PELVIS WITH CONTRAST TECHNIQUE: Multidetector CT imaging of the abdomen and pelvis was performed using the standard protocol following bolus administration of intravenous contrast. CONTRAST:  17mL OMNIPAQUE IOHEXOL 300 MG/ML  SOLN COMPARISON:  Radiograph 12/25/2019. Chest and abdominal CTA 12/21/2019. FINDINGS: Lower chest: Similar streaky bilateral lung opacities. Small right and trace left pleural effusion. Compressive atelectasis in the right middle and lower lobe. Hepatobiliary: No focal hepatic abnormality. Gallbladder minimally distended. There is no biliary dilatation. Pancreas: No ductal dilatation or inflammation. Spleen: Normal in size without focal abnormality. Adrenals/Urinary Tract: No adrenal nodule. No hydronephrosis or perinephric edema. Homogeneous renal enhancement with symmetric excretion on delayed phase imaging. Urinary bladder is partially distended without wall thickening. Stomach/Bowel: Marked gastric distention with fluid and enteric contrast. Diffusely dilated and fluid-filled  small bowel. Transition point in the central abdomen, series 3, image 74. there is central mesenteric edema that has slightly progressed from prior. More distal small bowel are decompressed. No pneumatosis. Minimal liquid and solid stool in the colon. No abnormal colonic distention. Descending and sigmoid diverticulosis without diverticulitis. Vascular/Lymphatic: Mild aortic atherosclerosis. No aortic aneurysm. The portal vein is patent. No evidence of mesenteric or portal venous gas. No adenopathy. Reproductive: Prostate is unremarkable. Other: Small volume ascites in the upper abdomen and pelvis. Scattered mesenteric ascites. Mesenteric edema. There is no free air. Tiny fat containing umbilical hernia. Musculoskeletal: Degenerative change in the spine, unchanged from prior. There are no acute or suspicious osseous abnormalities. IMPRESSION: 1. Small bowel obstruction with transition point in the central abdomen, likely due to adhesions. There is progression and mesenteric edema and bowel distension from obstruction on CT 6 days ago. 2. Small volume ascites in the upper abdomen and pelvis. 3. Colonic diverticulosis without diverticulitis. 4. Small right and trace left pleural effusions are new. Aortic Atherosclerosis (ICD10-I70.0). Electronically Signed   By: Keith Rake M.D.   On: 12/27/2019 01:50   Scheduled Meds: . amLODipine  10 mg Oral Daily  . bisacodyl  10 mg Rectal q morning - 10a  . Chlorhexidine Gluconate Cloth  6 each Topical Daily  . enoxaparin (LOVENOX) injection  40 mg Subcutaneous Q24H  . hydrALAZINE  50 mg Oral Q8H  . metoprolol tartrate  100 mg Oral BID  . pantoprazole  40 mg Oral Q1200  . polyethylene glycol  17 g Oral Daily   Continuous Infusions: . lactated ringers    . lactated ringers 75 mL/hr at 12/28/19 0912    LOS: 7 days   Time spent: 15 mins   Lynnsie Linders Wynetta Emery, MD How to contact the [facility] Attending or Consulting provider [place] or covering provider during after  hours [place], for this patient?  1. Check the care team in [facility] and look for a) attending/consulting TRH provider listed and b) the [facility] team listed 2. Log into  [email or web address] and use [place]'s universal password to access. If you do not have the password, please contact the hospital operator. 3. Locate the [facility] provider you are looking for under [facility] and page to a number that you can be directly reached. 4. If you still have difficulty reaching the provider, please page the [facility] (Director on Call) for the Hospitalists listed on amion for assistance.  12/28/2019, 12:09 PM

## 2019-12-28 NOTE — Anesthesia Postprocedure Evaluation (Signed)
Anesthesia Post Note  Patient: Ethan Schmidt  Procedure(s) Performed: EXPLORATORY LAPAROTOMY WITH LYSIS OF ADHESIONS (N/A Abdomen)  Patient location during evaluation: Nursing Unit Anesthesia Type: General Level of consciousness: awake and alert and oriented Pain management: pain level controlled Respiratory status: spontaneous breathing and respiratory function stable Cardiovascular status: blood pressure returned to baseline and stable Postop Assessment: no apparent nausea or vomiting and adequate PO intake Anesthetic complications: no   No complications documented.   Last Vitals:  Vitals:   12/28/19 0902 12/28/19 1429  BP: 122/79 (!) 144/88  Pulse: 69 70  Resp: 18 19  Temp: 37.3 C 37.4 C  SpO2: 98% 98%    Last Pain:  Vitals:   12/28/19 1624  TempSrc:   PainSc: 3                  [name] C Tarae Wooden

## 2019-12-29 ENCOUNTER — Inpatient Hospital Stay: Payer: Self-pay

## 2019-12-29 LAB — MAGNESIUM: Magnesium: 2 mg/dL (ref 1.7–2.4)

## 2019-12-29 LAB — CBC
HCT: 41.2 % (ref 39.0–52.0)
Hemoglobin: 13.1 g/dL (ref 13.0–17.0)
MCH: 31.4 pg (ref 26.0–34.0)
MCHC: 31.8 g/dL (ref 30.0–36.0)
MCV: 98.8 fL (ref 80.0–100.0)
Platelets: 484 10*3/uL — ABNORMAL HIGH (ref 150–400)
RBC: 4.17 MIL/uL — ABNORMAL LOW (ref 4.22–5.81)
RDW: 13.4 % (ref 11.5–15.5)
WBC: 7.6 10*3/uL (ref 4.0–10.5)
nRBC: 0 % (ref 0.0–0.2)

## 2019-12-29 LAB — BASIC METABOLIC PANEL
Anion gap: 9 (ref 5–15)
BUN: 21 mg/dL — ABNORMAL HIGH (ref 6–20)
CO2: 22 mmol/L (ref 22–32)
Calcium: 8.5 mg/dL — ABNORMAL LOW (ref 8.9–10.3)
Chloride: 106 mmol/L (ref 98–111)
Creatinine, Ser: 1.75 mg/dL — ABNORMAL HIGH (ref 0.61–1.24)
GFR calc Af Amer: 49 mL/min — ABNORMAL LOW (ref >60)
GFR calc non Af Amer: 42 mL/min — ABNORMAL LOW (ref >60)
Glucose, Bld: 110 mg/dL — ABNORMAL HIGH (ref 70–99)
Potassium: 3.4 mmol/L — ABNORMAL LOW (ref 3.5–5.1)
Sodium: 137 mmol/L (ref 135–145)

## 2019-12-29 LAB — PHOSPHORUS: Phosphorus: 3.1 mg/dL (ref 2.5–4.6)

## 2019-12-29 MED ORDER — POLYETHYLENE GLYCOL 3350 17 G PO PACK
17.0000 g | PACK | Freq: Every day | ORAL | Status: DC
  Administered 2019-12-30: 17 g via ORAL
  Filled 2019-12-29 (×2): qty 1

## 2019-12-29 MED ORDER — HYDRALAZINE HCL 25 MG PO TABS
100.0000 mg | ORAL_TABLET | Freq: Three times a day (TID) | ORAL | Status: DC
  Administered 2019-12-29 – 2019-12-30 (×4): 100 mg via ORAL
  Filled 2019-12-29 (×5): qty 4

## 2019-12-29 MED ORDER — POTASSIUM CHLORIDE CRYS ER 20 MEQ PO TBCR
40.0000 meq | EXTENDED_RELEASE_TABLET | Freq: Once | ORAL | Status: AC
  Administered 2019-12-29: 40 meq via ORAL
  Filled 2019-12-29: qty 2

## 2019-12-29 MED ORDER — DOCUSATE SODIUM 100 MG PO CAPS
200.0000 mg | ORAL_CAPSULE | Freq: Every day | ORAL | Status: DC
  Administered 2019-12-29: 200 mg via ORAL
  Filled 2019-12-29: qty 2

## 2019-12-29 MED ORDER — POTASSIUM CHLORIDE 20 MEQ PO PACK
20.0000 meq | PACK | Freq: Two times a day (BID) | ORAL | Status: DC
  Administered 2019-12-29 – 2019-12-31 (×5): 20 meq via ORAL
  Filled 2019-12-29 (×5): qty 1

## 2019-12-29 NOTE — Progress Notes (Signed)
PROGRESS NOTE   Ethan Schmidt  JOI:[id number] DOB: 01/20/1961 DOA: 12/21/2019 PCP: Patient, No Pcp Per   Chief Complaint  Patient presents with  . Dizziness    Brief Admission History:  59 y.o.malewith a history of uncontrolled hypertension, stage III chronic kidney disease, asthma, arthritis. Patient seen for onset of severe stabbing abdominal pain around and above his umbilicus that started around 3 AM and has been worsening. He attempted to take Pepto-Bismol, which was not effective. He has had some dry heaves earlier today, but has not had anything now. Pain radiates into his back. His pain is improved with IV pain medicine. No other palliating or provoking factors. Denies diarrhea, constipation, fevers, chills. Denies chest pain.  He does have a history of drug use with reported last cocaine use about a week ago.  Assessment & Plan:   Principal Problem:   Intestinal obstruction (HCC) Active Problems:   Acute renal failure superimposed on stage 3 chronic kidney disease (HCC)   History of COVID-19   Asthma   Hypertension   Hypertensive urgency   Hypokalemia   Abdominal pain   SBO (small bowel obstruction) ([place])  1. SBO - POD #2 s/p Exp Lap with lysis of adhesions - diet being tolerated, he is feeling better, still requiring IV pain meds today, continue postop care.   Added colace 200 mg QHS.  2. Malignant hypertension - BPs trending back up,  I have added back more of his regular BP meds, labetalol IV prn ordered.    3. AKI on CKD stage 3a - creatinine stable.  4. Hypokalemia - repleted orally, recheck in AM.   5. Asthma - bronchodilators as needed.    DVT prophylaxis:  SCD Code Status:  FULL  Family Communication:  Disposition: Home   Status is: Inpatient  Remains inpatient appropriate because:postop management from exp lap. Pending clinical improvement and surgery sign off.   Dispo: The patient is from: Home              Anticipated d/c is to: Home               Anticipated d/c date is: 1-2 days              Patient currently is not medically stable to d/c.  Consultants:   Surgery   Procedures:     Antimicrobials:    Subjective: Pt seen after surgery, pain controlled.     Objective: Vitals:   12/28/19 1429 12/28/19 2000 12/29/19 0429 12/29/19 1315  BP: (!) 144/88 (!) 141/70 (!) 167/98 (!) 171/104  Pulse: 70 67 64 67  Resp: 19 16 16    Temp: 99.4 F (37.4 C) 98.9 F (37.2 C) 98.9 F (37.2 C)   TempSrc: Oral Oral Oral   SpO2: 98% 93% 96%   Weight:      Height:        Intake/Output Summary (Last 24 hours) at 12/29/2019 1401 Last data filed at 12/29/2019 0900 Gross per 24 hour  Intake 960 ml  Output 402 ml  Net 558 ml   Filed Weights   12/21/19 0946 12/27/19 0838  Weight: 104.3 kg 104.3 kg    Examination:  General exam: Appears calm and comfortable  Respiratory system: Clear to auscultation. Respiratory effort normal. Cardiovascular system: S1 & S2 heard, RRR. No JVD, murmurs, rubs, gallops or clicks. No pedal edema. Gastrointestinal system: Abdomen is soft, nondistended. No organomegaly or masses felt. Normal bowel sounds heard. Central nervous system: Alert and oriented. No  focal neurological deficits. Extremities: Symmetric 5 x 5 power. Skin: No rashes, lesions or ulcers Psychiatry: Judgement and insight appear normal. Mood & affect appropriate.   Data Reviewed: I have personally reviewed following labs and imaging studies  CBC: Recent Labs  Lab 12/24/19 0611 12/25/19 0558 12/26/19 0508 12/28/19 0624 12/29/19 0527  WBC 9.3 6.8 7.6 7.1 7.6  NEUTROABS  --   --  4.9  --   --   HGB 13.2 12.9* 13.9 13.5 13.1  HCT 42.4 41.4 42.6 42.8 41.2  MCV 101.4* 100.5* 98.2 99.5 98.8  PLT 393 382 437* 507* 484*    Basic Metabolic Panel: Recent Labs  Lab 12/23/19 0602 12/23/19 0602 12/24/19 8144 12/24/19 8185 12/25/19 0558 12/26/19 0508 12/27/19 0515 12/28/19 0624 12/29/19 0527  NA 141   < > 139   < > 137 138  133* 138 137  K 3.8   < > 3.6   < > 3.5 3.3* 3.9 3.6 3.4*  CL 103   < > 108   < > 101 100 102 105 106  CO2 26   < > 25   < > 26 24 20* 22 22  GLUCOSE 102*   < > 95   < > 108* 111* 134* 88 110*  BUN 23*   < > 20   < > 16 19 22* 25* 21*  CREATININE 1.62*   < > 1.40*   < > 1.42* 1.60* 1.65* 1.96* 1.75*  CALCIUM 8.8*   < > 8.5*   < > 8.7* 9.1 9.0 8.7* 8.5*  MG 2.1  --  2.1  --   --   --  2.1 1.9 2.0  PHOS 2.5  --   --   --   --   --   --  2.8 3.1   < > = values in this interval not displayed.    GFR: Estimated Creatinine Clearance: 59.9 mL/min (A) (by C-G formula based on SCr of 1.75 mg/dL (H)).  Liver Function Tests: Recent Labs  Lab 12/23/19 0602 12/24/19 0611 12/26/19 0508 12/27/19 0515 12/28/19 0624  AST 27 20 23 16 22   ALT 18 15 23 20 21   ALKPHOS 75 65 71 72 58  BILITOT 0.8 1.1 0.7 0.7 0.6  PROT 7.2 6.7 7.1 7.2 6.1*  ALBUMIN 3.7 3.3* 3.5 3.6 3.2*    CBG: No results for input(s): GLUCAP in the last 168 hours.  Recent Results (from the past 240 hour(s))  SARS Coronavirus 2 by RT PCR (hospital order, performed in [facility] hospital lab) Nasopharyngeal Nasopharyngeal Swab     Status: None   Collection Time: 12/21/19  4:05 PM   Specimen: Nasopharyngeal Swab  Result Value Ref Range Status   SARS Coronavirus 2 NEGATIVE NEGATIVE Final    Comment: (NOTE) SARS-CoV-2 target nucleic acids are NOT DETECTED.  The SARS-CoV-2 RNA is generally detectable in upper and lower respiratory specimens during the acute phase of infection. The lowest concentration of SARS-CoV-2 viral copies this assay can detect is 250 copies / mL. A negative result does not preclude SARS-CoV-2 infection and should not be used as the sole basis for treatment or other patient management decisions.  A negative result may occur with improper specimen collection / handling, submission of specimen other than nasopharyngeal swab, presence of viral mutation(s) within the areas targeted by this assay, and  inadequate number of viral copies (<250 copies / mL). A negative result must be combined with clinical observations, patient history, and epidemiological  information.  Fact Sheet for Patients:   StrictlyIdeas.no  Fact Sheet for Healthcare Providers: BankingDealers.co.za  This test is not yet approved or  cleared by the [place] FDA and has been authorized for detection and/or diagnosis of SARS-CoV-2 by FDA under an Emergency Use Authorization (EUA).  This EUA will remain in effect (meaning this test can be used) for the duration of the COVID-19 declaration under Section 564(b)(1) of the Act, 21 U.S.C. section 360bbb-3(b)(1), unless the authorization is terminated or revoked sooner.  Performed at [facility], [address], [place], [place] [address]      Radiology Studies: [place] EKG SITE RITE  Result Date: 12/29/2019 [facility] image not attached, placement could not be confirmed due to current cardiac rhythm.  Scheduled Meds: . amLODipine  10 mg Oral Daily  . enoxaparin (LOVENOX) injection  40 mg Subcutaneous Q24H  . hydrALAZINE  100 mg Oral Q8H  . metoprolol tartrate  100 mg Oral BID  . pantoprazole  40 mg Oral Q1200  . polyethylene glycol  17 g Oral Daily  . potassium chloride  20 mEq Oral BID   Continuous Infusions: . lactated ringers    . lactated ringers 50 mL/hr at 12/29/19 1335    LOS: 8 days   Time spent: 18 mins   Jeramy Dimmick Wynetta Emery, MD How to contact the [facility] Attending or Consulting provider [place] or covering provider during after hours [place], for this patient?  1. Check the care team in [facility] and look for a) attending/consulting TRH provider listed and b) the [facility] team listed 2. Log into [email or web address] and use [place]'s universal password to access. If you do not have the password, please contact the hospital operator. 3. Locate the [facility] provider you are looking for under [facility] and page to a  number that you can be directly reached. 4. If you still have difficulty reaching the provider, please page the [facility] (Director on Call) for the Hospitalists listed on amion for assistance.  12/29/2019, 2:01 PM

## 2019-12-29 NOTE — Progress Notes (Signed)
2 Days Post-Op  Subjective: Patient had small liquid bowel movement.  Is still passing flatus.  Incisional pain decreased.  No nausea or vomiting are noted with regular diet.  Objective: Vital signs in last 24 hours: Temp:  [98.9 F (37.2 C)-99.4 F (37.4 C)] 98.9 F (37.2 C) (06/27 0429) Pulse Rate:  [64-70] 64 (06/27 0429) Resp:  [16-19] 16 (06/27 0429) BP: (141-167)/(70-98) 167/98 (06/27 0429) SpO2:  [93 %-98 %] 96 % (06/27 0429) Last BM Date: 12/26/19  Intake/Output from previous day: 06/26 0701 - 06/27 0700 In: 1080 [P.O.:1080] Out: 2 [Urine:2] Intake/Output this shift: No intake/output data recorded.  General appearance: alert, cooperative and no distress GI: Soft, dressing dry and intact.  Abdominal binder in place.  Bowel sounds appreciated.  Lab Results:  Recent Labs    12/28/19 0624 12/29/19 0527  WBC 7.1 7.6  HGB 13.5 13.1  HCT 42.8 41.2  PLT 507* 484*   BMET Recent Labs    12/28/19 0624 12/29/19 0527  NA 138 137  K 3.6 3.4*  CL 105 106  CO2 22 22  GLUCOSE 88 110*  BUN 25* 21*  CREATININE 1.96* 1.75*  CALCIUM 8.7* 8.5*   PT/INR No results for input(s): LABPROT, INR in the last 72 hours.  Studies/Results: No results found.  Anti-infectives: Anti-infectives (From admission, onward)   Start     Dose/Rate Route Frequency Ordered Stop   12/27/19 0800  cefoTEtan (CEFOTAN) 2 g in sodium chloride 0.9 % 100 mL IVPB        2 g 200 mL/hr over 30 Minutes Intravenous On call to O.R. 12/27/19 0751 12/27/19 0927      Assessment/Plan: s/p Procedure(s): EXPLORATORY LAPAROTOMY WITH LYSIS OF ADHESIONS Impression: Postoperative day 2, bowel function slowly returning.  Blood pressure under somewhat better control.  This is being managed by the hospitalist.  Hopefully will be stable for discharge in the next 24 to 48 hours.  LOS: 8 days    Aviva Signs 12/29/2019

## 2019-12-29 NOTE — Progress Notes (Signed)
Serous filled blisters to the right hand and fingers.  Patient states that he has had them before.  Will leave note for the MD to assess in the AM.  Will continue to monitor.

## 2019-12-29 NOTE — Progress Notes (Signed)
Amy RN states CVW has been notified re PICC placement.

## 2019-12-30 ENCOUNTER — Encounter (HOSPITAL_COMMUNITY): Payer: Self-pay | Admitting: General Surgery

## 2019-12-30 LAB — BASIC METABOLIC PANEL
Anion gap: 10 (ref 5–15)
BUN: 14 mg/dL (ref 6–20)
CO2: 23 mmol/L (ref 22–32)
Calcium: 8.5 mg/dL — ABNORMAL LOW (ref 8.9–10.3)
Chloride: 106 mmol/L (ref 98–111)
Creatinine, Ser: 1.51 mg/dL — ABNORMAL HIGH (ref 0.61–1.24)
GFR calc Af Amer: 59 mL/min — ABNORMAL LOW (ref >60)
GFR calc non Af Amer: 51 mL/min — ABNORMAL LOW (ref >60)
Glucose, Bld: 93 mg/dL (ref 70–99)
Potassium: 3.4 mmol/L — ABNORMAL LOW (ref 3.5–5.1)
Sodium: 139 mmol/L (ref 135–145)

## 2019-12-30 LAB — MAGNESIUM: Magnesium: 1.7 mg/dL (ref 1.7–2.4)

## 2019-12-30 LAB — SURGICAL PATHOLOGY

## 2019-12-30 MED ORDER — HYDRALAZINE HCL 25 MG PO TABS
100.0000 mg | ORAL_TABLET | Freq: Four times a day (QID) | ORAL | Status: DC
  Administered 2019-12-30 – 2019-12-31 (×4): 100 mg via ORAL
  Filled 2019-12-30 (×4): qty 4

## 2019-12-30 MED ORDER — DOCUSATE SODIUM 100 MG PO CAPS
100.0000 mg | ORAL_CAPSULE | Freq: Every day | ORAL | Status: DC
  Filled 2019-12-30: qty 1

## 2019-12-30 NOTE — Progress Notes (Signed)
3 Days Post-Op  Subjective: Patient still with mild incisional pain.  No nausea or vomiting have been noted.  Bowel function is returning.  Objective: Vital signs in last 24 hours: Temp:  [97.6 F (36.4 C)-98.3 F (36.8 C)] 97.8 F (36.6 C) (06/28 0531) Pulse Rate:  [67-71] 71 (06/28 0531) Resp:  [18-19] 18 (06/28 0531) BP: (144-177)/(99-110) 167/99 (06/28 0531) SpO2:  [95 %-98 %] 95 % (06/28 0747) Last BM Date: 12/26/19  Intake/Output from previous day: 06/27 0701 - 06/28 0700 In: 2601.1 [P.O.:1080; I.V.:1521.1] Out: 1300 [Urine:1300] Intake/Output this shift: No intake/output data recorded.  General appearance: alert, cooperative and no distress GI: Soft, incision healing well.  Bowel sounds appreciated.  Lab Results:  Recent Labs    12/28/19 0624 12/29/19 0527  WBC 7.1 7.6  HGB 13.5 13.1  HCT 42.8 41.2  PLT 507* 484*   BMET Recent Labs    12/29/19 0527 12/30/19 0429  NA 137 139  K 3.4* 3.4*  CL 106 106  CO2 22 23  GLUCOSE 110* 93  BUN 21* 14  CREATININE 1.75* 1.51*  CALCIUM 8.5* 8.5*   PT/INR No results for input(s): LABPROT, INR in the last 72 hours.  Studies/Results: [place] EKG SITE RITE  Result Date: 12/29/2019 If Site Rite image not attached, placement could not be confirmed due to current cardiac rhythm.   Anti-infectives: Anti-infectives (From admission, onward)   Start     Dose/Rate Route Frequency Ordered Stop   12/27/19 0800  cefoTEtan (CEFOTAN) 2 g in sodium chloride 0.9 % 100 mL IVPB        2 g 200 mL/hr over 30 Minutes Intravenous On call to O.R. 12/27/19 0751 12/27/19 0927      Assessment/Plan: s/p Procedure(s): EXPLORATORY LAPAROTOMY WITH LYSIS OF ADHESIONS Impression: Postoperative day 3, status post exploratory laparotomy with lysis of adhesions.  Bowel function is returning.  Will start ambulating the patient.  Anticipate discharge in next 24 to 48 hours.  This was discussed with Dr. Wynetta Emery.  LOS: 9 days    Aviva Signs 12/30/2019

## 2019-12-30 NOTE — Progress Notes (Signed)
IV team consult received about current Midline. Per RN patient  c/o of pain in between axilla and shoulder area. Recommended to pull Midline 1 to 2 cm out. Will follow up.

## 2019-12-30 NOTE — Plan of Care (Signed)

## 2019-12-30 NOTE — Addendum Note (Signed)
Addendum  created 12/30/19 1612 by Vista Deck, CRNA   Charge Capture section accepted

## 2019-12-30 NOTE — Progress Notes (Signed)
PROGRESS NOTE   Ethan Schmidt  YIF:[id number] DOB: 1960/10/07 DOA: 12/21/2019 PCP: Patient, No Pcp Per   Chief Complaint  Patient presents with  . Dizziness    Brief Admission History:  59 y.o.malewith a history of uncontrolled hypertension, stage III chronic kidney disease, asthma, arthritis. Patient seen for onset of severe stabbing abdominal pain around and above his umbilicus that started around 3 AM and has been worsening. He attempted to take Pepto-Bismol, which was not effective. He has had some dry heaves earlier today, but has not had anything now. Pain radiates into his back. His pain is improved with IV pain medicine. No other palliating or provoking factors. Denies diarrhea, constipation, fevers, chills. Denies chest pain.  He does have a history of drug use with reported last cocaine use about a week ago.  Assessment & Plan:   Principal Problem:   Intestinal obstruction (HCC) Active Problems:   Acute renal failure superimposed on stage 3 chronic kidney disease (HCC)   History of COVID-19   Asthma   Hypertension   Hypertensive urgency   Hypokalemia   Abdominal pain   SBO (small bowel obstruction) ([place])  1. SBO - POD #3 s/p Exp Lap with lysis of adhesions - diet being tolerated, he is feeling better, pain improving, bowel function returning, continue postop care.    2. Malignant hypertension - BPs trending back up,  I have added back more of his regular BP meds, labetalol IV prn ordered.   Increase hydralazine to every 6 hours.   3. AKI on CKD stage 3a - creatinine stable.  4. Hypokalemia - repleted orally, recheck in AM.   5. Asthma - bronchodilators as needed.    DVT prophylaxis:  SCD Code Status:  FULL  Family Communication:  Disposition: Home   Status is: Inpatient  Remains inpatient appropriate because:postop management from exp lap. Pending clinical improvement and surgery sign off.  Anticipating discharge tomorrow 6/29 with surgery sign off.    Dispo: The patient is from: Home              Anticipated d/c is to: Home              Anticipated d/c date is: 12/31/19              Patient currently is not medically stable to d/c.  Consultants:   Surgery   Procedures:     Antimicrobials:    Subjective: Pt says his abdomen is starting to feel like normal.  He is having watery bowel movements but not formed solid stools.      Objective: Vitals:   12/29/19 2047 12/30/19 0531 12/30/19 0747 12/30/19 1449  BP: (!) 172/110 (!) 167/99  (!) 145/101  Pulse: 70 71  75  Resp: 19 18  16   Temp: 97.6 F (36.4 C) 97.8 F (36.6 C)  99.2 F (37.3 C)  TempSrc: Oral Oral  Oral  SpO2: 98% 97% 95% 98%  Weight:      Height:        Intake/Output Summary (Last 24 hours) at 12/30/2019 1550 Last data filed at 12/30/2019 0300 Gross per 24 hour  Intake 1761.06 ml  Output 350 ml  Net 1411.06 ml   Filed Weights   12/21/19 0946 12/27/19 0838  Weight: 104.3 kg 104.3 kg    Examination:  General exam: Appears calm and comfortable  Respiratory system: Clear to auscultation. Respiratory effort normal. Cardiovascular system: S1 & S2 heard, RRR. No JVD, murmurs, rubs, gallops or clicks.  No pedal edema. Gastrointestinal system: Abdomen is soft, nondistended. Much less pain, wound healing well,  No organomegaly or masses felt. Normal bowel sounds heard. Central nervous system: Alert and oriented. No focal neurological deficits. Extremities: Symmetric 5 x 5 power. Skin: No rashes, lesions or ulcers Psychiatry: Judgement and insight appear normal. Mood & affect appropriate.   Data Reviewed: I have personally reviewed following labs and imaging studies  CBC: Recent Labs  Lab 12/24/19 0611 12/25/19 0558 12/26/19 0508 12/28/19 0624 12/29/19 0527  WBC 9.3 6.8 7.6 7.1 7.6  NEUTROABS  --   --  4.9  --   --   HGB 13.2 12.9* 13.9 13.5 13.1  HCT 42.4 41.4 42.6 42.8 41.2  MCV 101.4* 100.5* 98.2 99.5 98.8  PLT 393 382 437* 507* 484*     Basic Metabolic Panel: Recent Labs  Lab 12/24/19 0611 12/25/19 0558 12/26/19 0508 12/27/19 0515 12/28/19 0624 12/29/19 0527 12/30/19 0429  NA 139   < > 138 133* 138 137 139  K 3.6   < > 3.3* 3.9 3.6 3.4* 3.4*  CL 108   < > 100 102 105 106 106  CO2 25   < > 24 20* 22 22 23   GLUCOSE 95   < > 111* 134* 88 110* 93  BUN 20   < > 19 22* 25* 21* 14  CREATININE 1.40*   < > 1.60* 1.65* 1.96* 1.75* 1.51*  CALCIUM 8.5*   < > 9.1 9.0 8.7* 8.5* 8.5*  MG 2.1  --   --  2.1 1.9 2.0 1.7  PHOS  --   --   --   --  2.8 3.1  --    < > = values in this interval not displayed.    GFR: Estimated Creatinine Clearance: 69.5 mL/min (A) (by C-G formula based on SCr of 1.51 mg/dL (H)).  Liver Function Tests: Recent Labs  Lab 12/24/19 0611 12/26/19 0508 12/27/19 0515 12/28/19 0624  AST 20 23 16 22   ALT 15 23 20 21   ALKPHOS 65 71 72 58  BILITOT 1.1 0.7 0.7 0.6  PROT 6.7 7.1 7.2 6.1*  ALBUMIN 3.3* 3.5 3.6 3.2*    CBG: No results for input(s): GLUCAP in the last 168 hours.  Recent Results (from the past 240 hour(s))  SARS Coronavirus 2 by RT PCR (hospital order, performed in [facility] hospital lab) Nasopharyngeal Nasopharyngeal Swab     Status: None   Collection Time: 12/21/19  4:05 PM   Specimen: Nasopharyngeal Swab  Result Value Ref Range Status   SARS Coronavirus 2 NEGATIVE NEGATIVE Final    Comment: (NOTE) SARS-CoV-2 target nucleic acids are NOT DETECTED.  The SARS-CoV-2 RNA is generally detectable in upper and lower respiratory specimens during the acute phase of infection. The lowest concentration of SARS-CoV-2 viral copies this assay can detect is 250 copies / mL. A negative result does not preclude SARS-CoV-2 infection and should not be used as the sole basis for treatment or other patient management decisions.  A negative result may occur with improper specimen collection / handling, submission of specimen other than nasopharyngeal swab, presence of viral mutation(s)  within the areas targeted by this assay, and inadequate number of viral copies (<250 copies / mL). A negative result must be combined with clinical observations, patient history, and epidemiological information.  Fact Sheet for Patients:   StrictlyIdeas.no  Fact Sheet for Healthcare Providers: BankingDealers.co.za  This test is not yet approved or  cleared by the [place]  FDA and has been authorized for detection and/or diagnosis of SARS-CoV-2 by FDA under an Emergency Use Authorization (EUA).  This EUA will remain in effect (meaning this test can be used) for the duration of the COVID-19 declaration under Section 564(b)(1) of the Act, 21 U.S.C. section 360bbb-3(b)(1), unless the authorization is terminated or revoked sooner.  Performed at [facility], [address], [place], [place] [address]      Radiology Studies: [place] EKG SITE RITE  Result Date: 12/29/2019 If [facility] image not attached, placement could not be confirmed due to current cardiac rhythm.  Scheduled Meds: . amLODipine  10 mg Oral Daily  . docusate sodium  200 mg Oral QHS  . enoxaparin (LOVENOX) injection  40 mg Subcutaneous Q24H  . hydrALAZINE  100 mg Oral Q6H  . metoprolol tartrate  100 mg Oral BID  . pantoprazole  40 mg Oral Q1200  . polyethylene glycol  17 g Oral Daily  . potassium chloride  20 mEq Oral BID   Continuous Infusions: . lactated ringers    . lactated ringers 50 mL/hr at 12/30/19 0300    LOS: 9 days   Time spent: 20 mins   Jlynn Ly Wynetta Emery, MD How to contact the [facility] Attending or Consulting provider [place] or covering provider during after hours [place], for this patient?  1. Check the care team in [facility] and look for a) attending/consulting TRH provider listed and b) the [facility] team listed 2. Log into [email or web address] and use [place]'s universal password to access. If you do not have the password, please contact the hospital  operator. 3. Locate the [facility] provider you are looking for under [facility] and page to a number that you can be directly reached. 4. If you still have difficulty reaching the provider, please page the [facility] (Director on Call) for the Hospitalists listed on amion for assistance.  12/30/2019, 3:50 PM

## 2019-12-31 ENCOUNTER — Other Ambulatory Visit (INDEPENDENT_AMBULATORY_CARE_PROVIDER_SITE_OTHER): Payer: Self-pay | Admitting: General Surgery

## 2019-12-31 DIAGNOSIS — Z09 Encounter for follow-up examination after completed treatment for conditions other than malignant neoplasm: Secondary | ICD-10-CM

## 2019-12-31 MED ORDER — HYDRALAZINE HCL 100 MG PO TABS: 100.0000 mg | ORAL_TABLET | Freq: Three times a day (TID) | ORAL | 1 refills | Status: DC

## 2019-12-31 MED ORDER — OXYCODONE-ACETAMINOPHEN 10-325 MG PO TABS: 1.0000 | ORAL_TABLET | Freq: Four times a day (QID) | ORAL | 0 refills | Status: DC | PRN

## 2019-12-31 MED ORDER — METOPROLOL TARTRATE 100 MG PO TABS
100.0000 mg | ORAL_TABLET | Freq: Two times a day (BID) | ORAL | 1 refills | Status: DC
Start: 2019-12-31 — End: 2020-11-21

## 2019-12-31 MED ORDER — OXYCODONE-ACETAMINOPHEN 7.5-325 MG PO TABS: 1.0000 | ORAL_TABLET | Freq: Four times a day (QID) | ORAL | 0 refills | Status: DC | PRN

## 2019-12-31 MED ORDER — AMLODIPINE BESYLATE 10 MG PO TABS
10.0000 mg | ORAL_TABLET | Freq: Every day | ORAL | 1 refills | Status: DC
Start: 2019-12-31 — End: 2020-11-21

## 2019-12-31 NOTE — TOC Transition Note (Signed)
Transition of Care [facility]) - CM/SW Discharge Note   Patient Details  Name: Ethan Schmidt MRN: [id number] Date of Birth: 1960/07/31  [facility] CM/SW Contact:  Shade Flood, LCSW Phone Number: 12/31/2019, 10:34 AM   Clinical Narrative:     Pt stable for dc today per MD. [place] to update on pt's dc. Was informed that they spoke with pt yesterday by phone but that pt stated that he was "in and out of it due to medications he was on" and he asked that they call him again on Wednesday. Confirmed that plan is for [facility] team to call pt tomorrow to get him started in their program for follow up care and medication assistance.   No other TOC needs identified for dc.  Final next level of care: Home/Self Care Barriers to Discharge: Barriers Resolved   Patient Goals and CMS Choice Patient states their goals for this hospitalization and ongoing recovery are:: return home      Discharge Placement                       Discharge Plan and Services In-house Referral: Clinical Social Work                                   Social Determinants of Health (SDOH) Interventions     Readmission Risk Interventions Readmission Risk Prevention Plan 12/25/2019  Transportation Screening Complete  PCP or Specialist Appt within 3-5 Days Not Complete  HRI or [place] Complete  Social Work Consult for [place] Planning/Counseling Complete  Palliative Care Screening Not Applicable  Medication Review Press photographer) Complete  Some recent data might be hidden

## 2019-12-31 NOTE — Discharge Instructions (Signed)
Soft foods low sodium diet recommended for next 2 weeks.  Please go see primary care physician for blood pressure check in next week.  Please have labs rechecked in next 1-2 weeks  Please follow up with Dr. Arnoldo Morale on 7/8 as scheduled.     Exploratory Laparotomy, Adult, Care After This sheet gives you information about how to care for yourself after your procedure. Your health care provider may also give you more specific instructions. If you have problems or questions, contact your health care provider. What can I expect after the procedure? After the procedure, it is common to have:  Abdominal soreness.  Fatigue.  A sore throat from the tube in your throat.  A lack of appetite. Follow these instructions at home: Medicines  Take over-the-counter and prescription medicines only as told by your health care provider.  If you were prescribed an antibiotic medicine, take it as told by your health care provider. Do not stop taking the antibiotic even if you start to feel better.  Do not drive or operate heavy machinery while taking pain medicine.  If you are taking prescription pain medicine, take actions to prevent or treat constipation. Your health care provider may recommend that you: ? Drink enough fluid to keep your urine pale yellow. ? Eat foods that are high in fiber, such as fresh fruits and vegetables, whole grains, and beans. ? Limit foods that are high in fat and processed sugars, such as fried or sweet foods. ? Take an over-the-counter or prescription medicine for constipation. Undergoing surgery and taking pain medicines can make constipation worse. Incision care   Follow instructions from your health care provider about how to take care of your incision. Make sure you: ? Wash your hands with soap and water before you change your bandage (dressing). If soap and water are not available, use hand sanitizer. ? Change your dressing as told by your health care  provider. ? Leave stitches (sutures), skin glue, or adhesive strips in place. These skin closures may need to stay in place for 2 weeks or longer. If adhesive strip edges start to loosen and curl up, you may trim the loose edges. Do not remove adhesive strips completely unless your health care provider tells you to do that.  If you were sent home with a drain, follow instructions from your health care provider about how to care for it.  Check your incision area every day for signs of infection. Check for: ? Redness, swelling, or pain. ? Fluid or blood. ? Warmth. ? Pus or a bad smell. Activity   Rest as told by your health care provider. ? Avoid sitting for a long time without moving. Get up to take short walks every 1-2 hours. This is important to improve blood flow and breathing. Ask for help if you feel weak or unsteady.  Do not lift anything that is heavier than 5 lb (2.2 kg), or the limit that your health care provider tells you, until he or she says that it is safe.  Ask your health care provider when you can start to do your usual activities again, such as driving, going back to work, and having sex. Eating and drinking  You may eat what you usually eat. Include lots of whole grains, fruits, and vegetables in your diet. This will help to prevent constipation.  Drink enough fluid to keep your urine pale yellow. Bathing  Keep your incision clean and dry. Clean it as often as told by your health  care provider: ? Gently wash the incision with soap and water. ? Rinse the incision with water to remove all soap. ? Pat the incision dry with a clean towel. Do not rub the incision.  You may take showers after 48 hours.  Do not take baths, swim, or use a hot tub until your health care provider says it is okay to do so. General instructions  Do not use any products that contain nicotine or tobacco, such as cigarettes and e-cigarettes. These can delay healing after surgery. If you need  help quitting, ask your health care provider.  Wear compression stockings as told by your health care provider. These stockings help to prevent blood clots and reduce swelling in your legs.  Keep all follow-up visits as told by your health care provider. This is important. Contact a health care provider if:  You have a fever.  You have chills.  Your pain medicine is not helping.  You have constipation or diarrhea.  You have nausea or vomiting.  You have drainage, redness, swelling, or pain at your incision site. Get help right away if:  Your pain is getting worse.  You have not had a bowel movement for more than 3 days.  You have ongoing (persistent) vomiting.  The edges of your incision open up.  You have warmth, tenderness, and swelling in your calf.  You have trouble breathing.  You have chest pain. These symptoms may represent a serious problem that is an emergency. Do not wait to see if the symptoms will go away. Get medical help right away. Call your local emergency services (911 in the [place]). Do not drive yourself to the hospital. Summary  Abdominal soreness is common after exploratory laparotomy. Take over-the-counter and prescription pain medicines only as told by your health care provider.  Follow instructions from your health care provider about how to take care of your incision. Do not take baths, swim, or use a hot tub until your health care provider says it is okay to do so.  Watch for signs and symptoms of infection after surgery, including fever, chills, drainage from your incision, and worsening abdominal pain. This information is not intended to replace advice given to you by your health care provider. Make sure you discuss any questions you have with your health care provider. Document Revised: 08/13/2018 Document Reviewed: 06/30/2017 Elsevier Patient Education  [place].      Bowel Obstruction A bowel obstruction means that  something is blocking the small or large bowel. The bowel is also called the intestine. It is the long tube that connects the stomach to the opening of the butt (anus). When something blocks the bowel, food and fluids cannot pass through like normal. This condition needs to be treated. Treatment depends on the cause of the problem and how bad the problem is. What are the causes? Common causes of this condition include:  Scar tissue (adhesions) from past surgery or from high-energy X-rays (radiation).  Recent surgery in the belly. This affects how food moves in the bowel.  Some diseases, such as: ? Irritation of the lining of the digestive tract (Crohn's disease). ? Irritation of small pouches in the bowel (diverticulitis).  Growths or tumors.  A bulging organ (hernia).  Twisting of the bowel (volvulus).  A foreign body.  Slipping of a part of the bowel into another part (intussusception). What are the signs or symptoms? Symptoms of this condition include:  Pain in the belly.  Feeling sick  to your stomach (nauseous).  Throwing up (vomiting).  Bloating in the belly.  Being unable to pass gas.  Trouble pooping (constipation).  Watery poop (diarrhea).  A lot of belching. How is this diagnosed? This condition may be diagnosed based on:  A physical exam.  Medical history.  Imaging tests, such as X-ray or CT scan.  Blood tests.  Urine tests. How is this treated? Treatment for this condition may include:  Fluids and pain medicines that are given through an IV tube. Your doctor may tell you not to eat or drink if you feel sick to your stomach and are throwing up.  Eating a clear liquid diet for a few days.  Putting a small tube (nasogastric tube) into the stomach. This will help with pain, discomfort, and nausea by removing blocked air and fluids from the stomach.  Surgery. This may be needed if other treatments do not work. Follow these instructions at  home: Medicines  Take over-the-counter and prescription medicines only as told by your doctor.  If you were prescribed an antibiotic medicine, take it as told by your doctor. Do not stop taking the antibiotic even if you start to feel better. General instructions  Follow your diet as told by your doctor. You may need to: ? Only drink clear liquids until you start to get better. ? Avoid solid foods.  Return to your normal activities as told by your doctor. Ask your doctor what activities are safe for you.  Do not sit for a long time without moving. Get up to take short walks every 1-2 hours. This is important. Ask for help if you feel weak or unsteady.  Keep all follow-up visits as told by your doctor. This is important. How is this prevented? After having a bowel obstruction, you may be more likely to have another. You can do some things to stop it from happening again.  If you have a long-term (chronic) disease, contact your doctor if you see changes or problems.  Take steps to prevent or treat trouble pooping. Your doctor may ask that you: ? Drink enough fluid to keep your pee (urine) pale yellow. ? Take over-the-counter or prescription medicines. ? Eat foods that are high in fiber. These include beans, whole grains, and fresh fruits and vegetables. ? Limit foods that are high in fat and sugar. These include fried or sweet foods.  Stay active. Ask your doctor which exercises are safe for you.  Avoid stress.  Eat three small meals and three small snacks each day.  Work with a Publishing rights manager (dietitian) to make a meal plan that works for you.  Do not use any products that contain nicotine or tobacco, such as cigarettes and e-cigarettes. If you need help quitting, ask your doctor. Contact a doctor if:  You have a fever.  You have chills. Get help right away if:  You have pain or cramps that get worse.  You throw up blood.  You are sick to your stomach.  You cannot stop  throwing up.  You cannot drink fluids.  You feel mixed up (confused).  You feel very thirsty (dehydrated).  Your belly gets more bloated.  You feel weak or you pass out (faint). Summary  A bowel obstruction means that something is blocking the small or large bowel.  Treatment may include IV fluids and pain medicine. You may also have a clear liquid diet, a small tube in your stomach, or surgery.  Drink clear liquids and avoid solid  foods until you get better. This information is not intended to replace advice given to you by your health care provider. Make sure you discuss any questions you have with your health care provider. Document Revised: 11/01/2017 Document Reviewed: 11/01/2017 Elsevier Patient Education  [place].   IMPORTANT INFORMATION: PAY CLOSE ATTENTION   PHYSICIAN DISCHARGE INSTRUCTIONS  Follow with Primary care provider  Patient, No Pcp Per  and other consultants as instructed by your Hospitalist Physician  [place] IF SYMPTOMS COME BACK, WORSEN OR NEW PROBLEM DEVELOPS   Please note: You were cared for by a hospitalist during your hospital stay. Every effort will be made to forward records to your primary care provider.  You can request that your primary care provider send for your hospital records if they have not received them.  Once you are discharged, your primary care physician will handle any further medical issues. Please note that NO REFILLS for any discharge medications will be authorized once you are discharged, as it is imperative that you return to your primary care physician (or establish a relationship with a primary care physician if you do not have one) for your post hospital discharge needs so that they can reassess your need for medications and monitor your lab values.  Please get a complete blood count and chemistry panel checked by your Primary MD at your next visit, and again as instructed by your  Primary MD.  Get Medicines reviewed and adjusted: Please take all your medications with you for your next visit with your Primary MD  Laboratory/radiological data: Please request your Primary MD to go over all hospital tests and procedure/radiological results at the follow up, please ask your primary care provider to get all Hospital records sent to his/her office.  In some cases, they will be blood work, cultures and biopsy results pending at the time of your discharge. Please request that your primary care provider follow up on these results.  If you are diabetic, please bring your blood sugar readings with you to your follow up appointment with primary care.    Please call and make your follow up appointments as soon as possible.    Also Note the following: If you experience worsening of your admission symptoms, develop shortness of breath, life threatening emergency, suicidal or homicidal thoughts you must seek medical attention immediately by calling 911 or calling your MD immediately  if symptoms less severe.  You must read complete instructions/literature along with all the possible adverse reactions/side effects for all the Medicines you take and that have been prescribed to you. Take any new Medicines after you have completely understood and accpet all the possible adverse reactions/side effects.   Do not drive when taking Pain medications or sleeping medications (Benzodiazepines)  Do not take more than prescribed Pain, Sleep and Anxiety Medications. It is not advisable to combine anxiety,sleep and pain medications without talking with your primary care practitioner  Special Instructions: If you have smoked or chewed Tobacco  in the last 2 yrs please stop smoking, stop any regular Alcohol  and or any Recreational drug use.  Wear Seat belts while driving.  Do not drive if taking any narcotic, mind altering or controlled substances or recreational drugs or alcohol.

## 2019-12-31 NOTE — Discharge Summary (Signed)
Physician Discharge Summary  Ethan Schmidt JSE:[id number] DOB: July 02, 1961 DOA: 12/21/2019  PCP: Pt referred out to [facility] Surgeon: Dr. Arnoldo Morale   Admit date: 12/21/2019 Discharge date: 12/31/2019  Admitted From:  Home  Disposition: Home   Recommendations for Outpatient Follow-up:  1. Follow up with Dr. Arnoldo Morale on 7/8 as scheduled 2. Establish care with primary care clinic as soon as possible in next 1-2 weeks for BP check 3. Please obtain BMP/CBC in 1-2 weeks to follow renal function and electrolytes  Discharge Condition: STABLE   CODE STATUS: FULL    Brief Hospitalization Summary: Please see all hospital notes, images, labs for full details of the hospitalization. ADMISSION HPI: Ethan Schmidt is a 59 y.o. male with a history of uncontrolled hypertension, stage III chronic kidney disease, asthma, arthritis.  Patient seen for onset of severe stabbing abdominal pain around and above his umbilicus that started around 3 AM and has been worsening.  He attempted to take Pepto-Bismol, which was not effective.  He has had some dry heaves earlier today, but has not had anything now.  Pain radiates into his back.  His pain is improved with IV pain medicine.  No other palliating or provoking factors.  Denies diarrhea, constipation, fevers, chills.  Denies chest pain.  He does have a history of drug use with last cocaine use about a week ago.  Emergency Department Course: CT angiogram of his chest, abdomen, pelvis shows high-grade small bowel obstruction.  No evidence of dissection.  General surgery was consulted, who recommended admission.  Did not feel NG tube was warranted at this time as the patient not currently dry heaving or vomiting.  Assessment & Plan:   Principal Problem:   Intestinal obstruction Active Problems:   Acute renal failure superimposed on stage 3 chronic kidney disease   History of COVID-19   Asthma   Hypertension   Hypertensive urgency   Hypokalemia   Abdominal  pain   SBO (small bowel obstruction)  1. SBO - POD #4 s/p Exp Lap with lysis of adhesions - diet being tolerated, he is feeling better, pain improving, bowel function returned, had a bowel movement this morning, wounds healing well, surgery says he is stable to discharge home follow up with Dr. Arnoldo Morale on 01/09/20.  Soft foods low sodium diet recommended.     2. Malignant hypertension - continue metoprolol 100 mg BID and  Hydralazine 100 mg TID. Outpatient follow up with PCP.  [facility]erral to [facility].  Low sodium diet.    3. AKI on CKD stage 3a - creatinine stable to slightly improved.  Referral to nephrology made.   4. Hypokalemia - repleted orally, follow up outpatient.   5. Asthma - bronchodilators as needed.    DVT prophylaxis:  SCD Code Status:  FULL  Family Communication:  Disposition: Home   Discharge Diagnoses:  Principal Problem:   Intestinal obstruction ([place]) Active Problems:   Acute renal failure superimposed on stage 3 chronic kidney disease (HCC)   History of COVID-19   Asthma   Hypertension   Hypertensive urgency   Hypokalemia   Abdominal pain   SBO (small bowel obstruction) [facility])   Discharge Instructions: Discharge Instructions    Ambulatory referral to Nephrology   Complete by: As directed      Allergies as of 12/31/2019      Reactions   Shellfish Allergy Anaphylaxis   Omnipaque [iohexol] Hives, Swelling   Pt. Did fine with 1 hour pre medication.      Medication List  STOP taking these medications   lisinopril 20 MG tablet Commonly known as: ZESTRIL     TAKE these medications   albuterol 108 (90 Base) MCG/ACT inhaler Commonly known as: VENTOLIN HFA Inhale 1-2 puffs into the lungs every 4 (four) hours as needed for wheezing or shortness of breath.   amLODipine 10 MG tablet Commonly known as: NORVASC Take 1 tablet (10 mg total) by mouth daily.   ascorbic acid 500 MG tablet Commonly known as: VITAMIN C Take 1 tablet (500 mg total) by mouth  daily.   aspirin 81 MG tablet Take 81 mg by mouth daily.   guaiFENesin-dextromethorphan 100-10 MG/5ML syrup Commonly known as: ROBITUSSIN DM Take 10 mLs by mouth every 4 (four) hours as needed for cough.   hydrALAZINE 100 MG tablet Commonly known as: APRESOLINE Take 1 tablet (100 mg total) by mouth 3 (three) times daily.   loperamide 2 MG capsule Commonly known as: IMODIUM Take 1 capsule (2 mg total) by mouth as needed for diarrhea or loose stools.   metoprolol tartrate 100 MG tablet Commonly known as: LOPRESSOR Take 1 tablet (100 mg total) by mouth 2 (two) times daily.   ondansetron 4 MG tablet Commonly known as: Zofran Take 1 tablet (4 mg total) by mouth daily as needed for nausea or vomiting.   oxyCODONE-acetaminophen 7.5-325 MG tablet Commonly known as: Percocet Take 1 tablet by mouth every 6 (six) hours as needed.   pantoprazole 40 MG tablet Commonly known as: Protonix Take 1 tablet (40 mg total) by mouth daily.   Vitamin D 125 MCG (5000 UT) Caps Take 1 capsule by mouth daily.       Follow-up Information    Aviva Signs, MD. Schedule an appointment as soon as possible for a visit on 01/09/2020.   Specialty: General Surgery Contact information: [id number] [place] [address] [phone]              Allergies  Allergen Reactions  . Shellfish Allergy Anaphylaxis  . Omnipaque [Iohexol] Hives and Swelling    Pt. Did fine with 1 hour pre medication.   Allergies as of 12/31/2019      Reactions   Shellfish Allergy Anaphylaxis   Omnipaque [iohexol] Hives, Swelling   Pt. Did fine with 1 hour pre medication.      Medication List    STOP taking these medications   lisinopril 20 MG tablet Commonly known as: ZESTRIL     TAKE these medications   albuterol 108 (90 Base) MCG/ACT inhaler Commonly known as: VENTOLIN HFA Inhale 1-2 puffs into the lungs every 4 (four) hours as needed for wheezing or shortness of breath.   amLODipine 10 MG  tablet Commonly known as: NORVASC Take 1 tablet (10 mg total) by mouth daily.   ascorbic acid 500 MG tablet Commonly known as: VITAMIN C Take 1 tablet (500 mg total) by mouth daily.   aspirin 81 MG tablet Take 81 mg by mouth daily.   guaiFENesin-dextromethorphan 100-10 MG/5ML syrup Commonly known as: ROBITUSSIN DM Take 10 mLs by mouth every 4 (four) hours as needed for cough.   hydrALAZINE 100 MG tablet Commonly known as: APRESOLINE Take 1 tablet (100 mg total) by mouth 3 (three) times daily.   loperamide 2 MG capsule Commonly known as: IMODIUM Take 1 capsule (2 mg total) by mouth as needed for diarrhea or loose stools.   metoprolol tartrate 100 MG tablet Commonly known as: LOPRESSOR Take 1 tablet (100 mg total) by mouth 2 (two) times  daily.   ondansetron 4 MG tablet Commonly known as: Zofran Take 1 tablet (4 mg total) by mouth daily as needed for nausea or vomiting.   oxyCODONE-acetaminophen 7.5-325 MG tablet Commonly known as: Percocet Take 1 tablet by mouth every 6 (six) hours as needed.   pantoprazole 40 MG tablet Commonly known as: Protonix Take 1 tablet (40 mg total) by mouth daily.   Vitamin D 125 MCG (5000 UT) Caps Take 1 capsule by mouth daily.       Procedures/Studies: CT ABDOMEN PELVIS W CONTRAST  Result Date: 12/27/2019 CLINICAL DATA:  Bowel obstruction suspected.  Abdominal pain. EXAM: CT ABDOMEN AND PELVIS WITH CONTRAST TECHNIQUE: Multidetector CT imaging of the abdomen and pelvis was performed using the standard protocol following bolus administration of intravenous contrast. CONTRAST:  61mL OMNIPAQUE IOHEXOL 300 MG/ML  SOLN COMPARISON:  Radiograph 12/25/2019. Chest and abdominal CTA 12/21/2019. FINDINGS: Lower chest: Similar streaky bilateral lung opacities. Small right and trace left pleural effusion. Compressive atelectasis in the right middle and lower lobe. Hepatobiliary: No focal hepatic abnormality. Gallbladder minimally distended. There is no  biliary dilatation. Pancreas: No ductal dilatation or inflammation. Spleen: Normal in size without focal abnormality. Adrenals/Urinary Tract: No adrenal nodule. No hydronephrosis or perinephric edema. Homogeneous renal enhancement with symmetric excretion on delayed phase imaging. Urinary bladder is partially distended without wall thickening. Stomach/Bowel: Marked gastric distention with fluid and enteric contrast. Diffusely dilated and fluid-filled small bowel. Transition point in the central abdomen, series 3, image 74. there is central mesenteric edema that has slightly progressed from prior. More distal small bowel are decompressed. No pneumatosis. Minimal liquid and solid stool in the colon. No abnormal colonic distention. Descending and sigmoid diverticulosis without diverticulitis. Vascular/Lymphatic: Mild aortic atherosclerosis. No aortic aneurysm. The portal vein is patent. No evidence of mesenteric or portal venous gas. No adenopathy. Reproductive: Prostate is unremarkable. Other: Small volume ascites in the upper abdomen and pelvis. Scattered mesenteric ascites. Mesenteric edema. There is no free air. Tiny fat containing umbilical hernia. Musculoskeletal: Degenerative change in the spine, unchanged from prior. There are no acute or suspicious osseous abnormalities. IMPRESSION: 1. Small bowel obstruction with transition point in the central abdomen, likely due to adhesions. There is progression and mesenteric edema and bowel distension from obstruction on CT 6 days ago. 2. Small volume ascites in the upper abdomen and pelvis. 3. Colonic diverticulosis without diverticulitis. 4. Small right and trace left pleural effusions are new. Aortic Atherosclerosis (ICD10-I70.0). Electronically Signed   By: Keith Rake M.D.   On: 12/27/2019 01:50   DG Abd Portable 2V  Result Date: 12/25/2019 CLINICAL DATA:  Abdominal pain EXAM: PORTABLE ABDOMEN - 2 VIEW COMPARISON:  12/21/2019 FINDINGS: Multiple dilated  loops of small bowel are again identified and stable. Areas noted within the colon consistent with a partial small bowel obstruction. No bony abnormality is noted. IMPRESSION: Diffuse small bowel dilatation consistent with persistent obstruction. Electronically Signed   By: Inez Catalina M.D.   On: 12/25/2019 15:30   CT Angio Chest/Abd/Pel for Dissection W and/or Wo Contrast  Result Date: 12/21/2019 CLINICAL DATA:  Tearing chest and abdominal pain EXAM: CT ANGIOGRAPHY CHEST, ABDOMEN AND PELVIS TECHNIQUE: Multidetector CT imaging through the chest, abdomen and pelvis was performed using the standard protocol during bolus administration of intravenous contrast. Multiplanar reconstructed images and MIPs were obtained and reviewed to evaluate the vascular anatomy. CONTRAST:  29mL OMNIPAQUE IOHEXOL 350 MG/ML SOLN after 4 hour protocol prep for contrast allergy COMPARISON:  06/05/2016 FINDINGS: CTA CHEST  FINDINGS Cardiovascular: Heart size upper limits normal. No pericardial effusion. The RV is nondilated. Fair contrast opacification pulmonary arterial tree; the exam was not optimized for detection of pulmonary emboli. There is good contrast opacification of the thoracic aorta. No aneurysm. No dissection. No stenosis. Classic 3 vessel brachiocephalic arterial origin anatomy without proximal stenosis. No significant atheromatous change. Mediastinum/Nodes: Small hiatal hernia. No hilar or mediastinal adenopathy. Lungs/Pleura: No pleural effusion. No pneumothorax. Linear atelectasis or scarring in the lung bases. Musculoskeletal: No chest wall abnormality. No acute or significant osseous findings. Review of the MIP images confirms the above findings. CTA ABDOMEN AND PELVIS FINDINGS VASCULAR Aorta: Normal caliber aorta without aneurysm, dissection, vasculitis or significant stenosis. Celiac: Patent without evidence of aneurysm, dissection, vasculitis or significant stenosis. SMA: Patent without evidence of aneurysm,  dissection, vasculitis or significant stenosis. Renals: Both renal arteries are patent without evidence of aneurysm, dissection, vasculitis, fibromuscular dysplasia or significant stenosis. IMA: Patent without evidence of aneurysm, dissection, vasculitis or significant stenosis. Inflow: Minimal plaque at the aortic bifurcation. No dissection, aneurysm, or stenosis. Veins: No obvious venous abnormality within the limitations of this arterial phase study. Review of the MIP images confirms the above findings. NON-VASCULAR Hepatobiliary: No focal liver abnormality is seen. No gallstones, gallbladder wall thickening, or biliary dilatation. Pancreas: Unremarkable. No pancreatic ductal dilatation or surrounding inflammatory changes. Spleen: Normal in size without focal abnormality. Adrenals/Urinary Tract: Adrenal glands unremarkable. Kidneys enhance symmetrically without hydronephrosis. Urinary bladder physiologically distended. Stomach/Bowel: The stomach is decompressed. Duodenum unremarkable. There are multiple dilated mid abdominal small bowel loops. There is some edema in the mesentery on the right. Abrupt transition to decompressed distal small bowel just inferior to the umbilicus to the right of midline, without associated mass, abscess, or other pathologic finding. Colon is nondilated, with a few distal descending and sigmoid diverticula, without adjacent inflammatory change or abscess. Lymphatic: No abdominal or pelvic adenopathy. Reproductive: Prostate is unremarkable. Other: Bilateral pelvic phleboliths. Small volume pelvic and perihepatic ascites. No free air. Musculoskeletal: Spondylitic changes in the lower lumbar spine. No fracture or worrisome bone lesion. Review of the MIP images confirms the above findings. IMPRESSION: 1. Negative for aortic dissection or other acute vascular findings. 2. Mid/distal high-grade small bowel obstruction without evident etiology, suggesting adhesions. 3. Small volume pelvic  and perihepatic ascites. 4. Descending and sigmoid diverticulosis. 5. Small hiatal hernia. Electronically Signed   By: Lucrezia Europe M.D.   On: 12/21/2019 14:27   [place] EKG SITE RITE  Result Date: 12/29/2019 If Site Rite image not attached, placement could not be confirmed due to current cardiac rhythm.     Subjective: Pt says he woke up with a mild headache but otherwise has been doing better, he had a solid bowel movement this morning.  He is eating and drinking well with no problems.   Discharge Exam: Vitals:   12/31/19 0433 12/31/19 0804  BP: (!) 164/100   Pulse: (!) 57   Resp: 18   Temp: (!) 97.5 F (36.4 C)   SpO2: 97% 95%   Vitals:   12/30/19 1449 12/30/19 2117 12/31/19 0433 12/31/19 0804  BP: (!) 145/101 (!) 153/92 (!) 164/100   Pulse: 75 63 (!) 57   Resp: 16  18   Temp: 99.2 F (37.3 C) 98.8 F (37.1 C) (!) 97.5 F (36.4 C)   TempSrc: Oral Oral Oral   SpO2: 98% 99% 97% 95%  Weight:      Height:       General: Pt is alert,  awake, not in acute distress Cardiovascular: RRR, S1/S2 +, no rubs, no gallops Respiratory: CTA bilaterally, no wheezing, no rhonchi Abdominal: wound clean dry intact, Soft, NT, ND, bowel sounds + Extremities: no edema, no cyanosis   The results of significant diagnostics from this hospitalization (including imaging, microbiology, ancillary and laboratory) are listed below for reference.     Microbiology: Recent Results (from the past 240 hour(s))  SARS Coronavirus 2 by RT PCR (hospital order, performed in [facility] hospital lab) Nasopharyngeal Nasopharyngeal Swab     Status: None   Collection Time: 12/21/19  4:05 PM   Specimen: Nasopharyngeal Swab  Result Value Ref Range Status   SARS Coronavirus 2 NEGATIVE NEGATIVE Final    Comment: (NOTE) SARS-CoV-2 target nucleic acids are NOT DETECTED.  The SARS-CoV-2 RNA is generally detectable in upper and lower respiratory specimens during the acute phase of infection. The lowest concentration of  SARS-CoV-2 viral copies this assay can detect is 250 copies / mL. A negative result does not preclude SARS-CoV-2 infection and should not be used as the sole basis for treatment or other patient management decisions.  A negative result may occur with improper specimen collection / handling, submission of specimen other than nasopharyngeal swab, presence of viral mutation(s) within the areas targeted by this assay, and inadequate number of viral copies (<250 copies / mL). A negative result must be combined with clinical observations, patient history, and epidemiological information.  Fact Sheet for Patients:   StrictlyIdeas.no  Fact Sheet for Healthcare Providers: BankingDealers.co.za  This test is not yet approved or  cleared by the [place] FDA and has been authorized for detection and/or diagnosis of SARS-CoV-2 by FDA under an Emergency Use Authorization (EUA).  This EUA will remain in effect (meaning this test can be used) for the duration of the COVID-19 declaration under Section 564(b)(1) of the Act, 21 U.S.C. section 360bbb-3(b)(1), unless the authorization is terminated or revoked sooner.  Performed at [facility], [address], [place], [place] [address]      Labs: BNP (last 3 results) No results for input(s): BNP in the last 8760 hours. Basic Metabolic Panel: Recent Labs  Lab 12/26/19 0508 12/27/19 0515 12/28/19 0624 12/29/19 0527 12/30/19 0429  NA 138 133* 138 137 139  K 3.3* 3.9 3.6 3.4* 3.4*  CL 100 102 105 106 106  CO2 24 20* 22 22 23   GLUCOSE 111* 134* 88 110* 93  BUN 19 22* 25* 21* 14  CREATININE 1.60* 1.65* 1.96* 1.75* 1.51*  CALCIUM 9.1 9.0 8.7* 8.5* 8.5*  MG  --  2.1 1.9 2.0 1.7  PHOS  --   --  2.8 3.1  --    Liver Function Tests: Recent Labs  Lab 12/26/19 0508 12/27/19 0515 12/28/19 0624  AST 23 16 22   ALT 23 20 21   ALKPHOS 71 72 58  BILITOT 0.7 0.7 0.6  PROT 7.1 7.2 6.1*  ALBUMIN 3.5  3.6 3.2*   No results for input(s): LIPASE, AMYLASE in the last 168 hours. No results for input(s): AMMONIA in the last 168 hours. CBC: Recent Labs  Lab 12/25/19 0558 12/26/19 0508 12/28/19 0624 12/29/19 0527  WBC 6.8 7.6 7.1 7.6  NEUTROABS  --  4.9  --   --   HGB 12.9* 13.9 13.5 13.1  HCT 41.4 42.6 42.8 41.2  MCV 100.5* 98.2 99.5 98.8  PLT 382 437* 507* 484*   Cardiac Enzymes: No results for input(s): CKTOTAL, CKMB, CKMBINDEX, TROPONINI in the last 168 hours.  BNP: Invalid input(s): POCBNP CBG: No results for input(s): GLUCAP in the last 168 hours. D-Dimer No results for input(s): DDIMER in the last 72 hours. Hgb A1c No results for input(s): HGBA1C in the last 72 hours. Lipid Profile No results for input(s): CHOL, HDL, LDLCALC, TRIG, CHOLHDL, LDLDIRECT in the last 72 hours. Thyroid function studies No results for input(s): TSH, T4TOTAL, T3FREE, THYROIDAB in the last 72 hours.  Invalid input(s): FREET3 Anemia work up No results for input(s): VITAMINB12, FOLATE, FERRITIN, TIBC, IRON, RETICCTPCT in the last 72 hours. Urinalysis    Component Value Date/Time   COLORURINE YELLOW (A) 12/22/2019 0740   APPEARANCEUR CLEAR 12/22/2019 0740   LABSPEC 1.025 12/22/2019 0740   PHURINE 6.0 12/22/2019 0740   GLUCOSEU NEGATIVE 12/22/2019 0740   HGBUR NEGATIVE 12/22/2019 0740   BILIRUBINUR NEGATIVE 12/22/2019 0740   KETONESUR NEGATIVE 12/22/2019 0740   PROTEINUR 100 (A) 12/22/2019 0740   UROBILINOGEN 0.2 11/23/2011 0130   NITRITE NEGATIVE 12/22/2019 0740   LEUKOCYTESUR NEGATIVE 12/22/2019 0740   Sepsis Labs Invalid input(s): PROCALCITONIN,  WBC,  LACTICIDVEN Microbiology Recent Results (from the past 240 hour(s))  SARS Coronavirus 2 by RT PCR (hospital order, performed in [facility] hospital lab) Nasopharyngeal Nasopharyngeal Swab     Status: None   Collection Time: 12/21/19  4:05 PM   Specimen: Nasopharyngeal Swab  Result Value Ref Range Status   SARS Coronavirus 2 NEGATIVE  NEGATIVE Final    Comment: (NOTE) SARS-CoV-2 target nucleic acids are NOT DETECTED.  The SARS-CoV-2 RNA is generally detectable in upper and lower respiratory specimens during the acute phase of infection. The lowest concentration of SARS-CoV-2 viral copies this assay can detect is 250 copies / mL. A negative result does not preclude SARS-CoV-2 infection and should not be used as the sole basis for treatment or other patient management decisions.  A negative result may occur with improper specimen collection / handling, submission of specimen other than nasopharyngeal swab, presence of viral mutation(s) within the areas targeted by this assay, and inadequate number of viral copies (<250 copies / mL). A negative result must be combined with clinical observations, patient history, and epidemiological information.  Fact Sheet for Patients:   StrictlyIdeas.no  Fact Sheet for Healthcare Providers: BankingDealers.co.za  This test is not yet approved or  cleared by the [place] FDA and has been authorized for detection and/or diagnosis of SARS-CoV-2 by FDA under an Emergency Use Authorization (EUA).  This EUA will remain in effect (meaning this test can be used) for the duration of the COVID-19 declaration under Section 564(b)(1) of the Act, 21 U.S.C. section 360bbb-3(b)(1), unless the authorization is terminated or revoked sooner.  Performed at [facility], [address], [place], [place] [address]    Time coordinating discharge:  34 minutes   SIGNED:  Irwin Brakeman, MD  [facility] 12/31/2019, 9:29 AM How to contact the [facility] Attending or Consulting provider [place] or covering provider during after hours [place], for this patient?  1. Check the care team in [facility] and look for a) attending/consulting [facility] provider listed and b) the [facility] team listed 2. Log into [email or web address] and use [place]'s universal password to access.  If you do not have the password, please contact the hospital operator. 3. Locate the [facility] provider you are looking for under [facility] and page to a number that you can be directly reached. 4. If you still have difficulty reaching the provider, please page the [facility] (Director on Call) for  the Hospitalists listed on amion for assistance.

## 2019-12-31 NOTE — Progress Notes (Signed)
4 Days Post-Op  Subjective: Patient has minimal abdominal pain.  Tolerating diet well.  Has had multiple bowel movements.  Objective: Vital signs in last 24 hours: Temp:  [97.5 F (36.4 C)-99.2 F (37.3 C)] 97.5 F (36.4 C) (06/29 0433) Pulse Rate:  [57-75] 57 (06/29 0433) Resp:  [16-18] 18 (06/29 0433) BP: (145-164)/(92-101) 164/100 (06/29 0433) SpO2:  [95 %-99 %] 95 % (06/29 0804) Last BM Date: 12/26/19  Intake/Output from previous day: No intake/output data recorded. Intake/Output this shift: No intake/output data recorded.  General appearance: alert, cooperative and no distress GI: Soft, incision healing well.  Bowel sounds active.  Lab Results:  Recent Labs    12/29/19 0527  WBC 7.6  HGB 13.1  HCT 41.2  PLT 484*   BMET Recent Labs    12/29/19 0527 12/30/19 0429  NA 137 139  K 3.4* 3.4*  CL 106 106  CO2 22 23  GLUCOSE 110* 93  BUN 21* 14  CREATININE 1.75* 1.51*  CALCIUM 8.5* 8.5*   PT/INR No results for input(s): LABPROT, INR in the last 72 hours.  Studies/Results: [place] EKG SITE RITE  Result Date: 12/29/2019 If Site Rite image not attached, placement could not be confirmed due to current cardiac rhythm.   Anti-infectives: Anti-infectives (From admission, onward)   Start     Dose/Rate Route Frequency Ordered Stop   12/27/19 0800  cefoTEtan (CEFOTAN) 2 g in sodium chloride 0.9 % 100 mL IVPB        2 g 200 mL/hr over 30 Minutes Intravenous On call to O.R. 12/27/19 0751 12/27/19 0927      Assessment/Plan: s/p Procedure(s): EXPLORATORY LAPAROTOMY WITH LYSIS OF ADHESIONS Impression: Doing well from surgery standpoint.  GI function has fully returned.  Okay for discharge today.  I have ordered Percocet for pain.  He will follow-up in my office in 1 week for staple removal.  LOS: 10 days    Aviva Signs 12/31/2019

## 2019-12-31 NOTE — Progress Notes (Signed)
Change in therapy. 

## 2019-12-31 NOTE — Progress Notes (Signed)
Right midline line single lumen removed,patient tolerated procedure.Sterile dressing applied informed patient to leave on for 24 hours,patient verbalized understanding.

## 2019-12-31 NOTE — Progress Notes (Signed)
Nsg Discharge Note  Admit Date:  12/21/2019 Discharge date: 12/31/2019   Dione Housekeeper to be D/C'd Home per MD order.  AVS completed.  Copy for chart, and copy for patient signed, and dated. Patient/caregiver able to verbalize understanding.  Discharge Medication: Allergies as of 12/31/2019      Reactions   Shellfish Allergy Anaphylaxis   Omnipaque [iohexol] Hives, Swelling   Pt. Did fine with 1 hour pre medication.      Medication List    STOP taking these medications   lisinopril 20 MG tablet Commonly known as: ZESTRIL     TAKE these medications   albuterol 108 (90 Base) MCG/ACT inhaler Commonly known as: VENTOLIN HFA Inhale 1-2 puffs into the lungs every 4 (four) hours as needed for wheezing or shortness of breath.   amLODipine 10 MG tablet Commonly known as: NORVASC Take 1 tablet (10 mg total) by mouth daily.   ascorbic acid 500 MG tablet Commonly known as: VITAMIN C Take 1 tablet (500 mg total) by mouth daily.   aspirin 81 MG tablet Take 81 mg by mouth daily.   guaiFENesin-dextromethorphan 100-10 MG/5ML syrup Commonly known as: ROBITUSSIN DM Take 10 mLs by mouth every 4 (four) hours as needed for cough.   hydrALAZINE 100 MG tablet Commonly known as: APRESOLINE Take 1 tablet (100 mg total) by mouth 3 (three) times daily.   loperamide 2 MG capsule Commonly known as: IMODIUM Take 1 capsule (2 mg total) by mouth as needed for diarrhea or loose stools.   metoprolol tartrate 100 MG tablet Commonly known as: LOPRESSOR Take 1 tablet (100 mg total) by mouth 2 (two) times daily.   ondansetron 4 MG tablet Commonly known as: Zofran Take 1 tablet (4 mg total) by mouth daily as needed for nausea or vomiting.   oxyCODONE-acetaminophen 7.5-325 MG tablet Commonly known as: Percocet Take 1 tablet by mouth every 6 (six) hours as needed.   pantoprazole 40 MG tablet Commonly known as: Protonix Take 1 tablet (40 mg total) by mouth daily.   Vitamin D 125 MCG (5000 UT)  Caps Take 1 capsule by mouth daily.       Discharge Assessment: Vitals:   12/31/19 0433 12/31/19 0804  BP: (!) 164/100   Pulse: (!) 57   Resp: 18   Temp: (!) 97.5 F (36.4 C)   SpO2: 97% 95%   Skin clean, dry and intact without evidence of skin break down, no evidence of skin tears noted. IV catheter discontinued intact. Site without signs and symptoms of complications - no redness or edema noted at insertion site, patient denies c/o pain - only slight tenderness at site.  Dressing with slight pressure applied.  D/c Instructions-Education: Discharge instructions given to patient/family with verbalized understanding. D/c education completed with patient/family including follow up instructions, medication list, d/c activities limitations if indicated, with other d/c instructions as indicated by MD - patient able to verbalize understanding, all questions fully answered. Patient instructed to return to ED, call 911, or call MD for any changes in condition.  Patient escorted via [place], and D/C home via private auto.  Dorcas Mcmurray, LPN 08/13/4707 62:83 PM

## 2020-01-14 ENCOUNTER — Ambulatory Visit: Payer: Self-pay | Admitting: General Surgery

## 2020-01-16 ENCOUNTER — Other Ambulatory Visit: Payer: Self-pay

## 2020-01-16 ENCOUNTER — Ambulatory Visit (INDEPENDENT_AMBULATORY_CARE_PROVIDER_SITE_OTHER): Payer: Self-pay | Admitting: General Surgery

## 2020-01-16 ENCOUNTER — Encounter: Payer: Self-pay | Admitting: General Surgery

## 2020-01-16 VITALS — BP 120/79 | HR 63 | Temp 98.5°F | Resp 16 | Ht 72.0 in | Wt 219.0 lb

## 2020-01-16 DIAGNOSIS — Z09 Encounter for follow-up examination after completed treatment for conditions other than malignant neoplasm: Secondary | ICD-10-CM

## 2020-01-16 NOTE — Progress Notes (Signed)
Subjective:     Ethan Schmidt  Here for follow-up status post exploratory laparotomy with lysis of adhesions.  Patient states he is doing very well.  His bowel function has returned to normal.  Denies any nausea or vomiting.  He has no significant abdominal pain. Objective:    BP 120/79   Pulse 63   Temp 98.5 F (36.9 C) (Oral)   Resp 16   Ht 6' (1.829 m)   Wt 219 lb (99.3 kg)   SpO2 94%   BMI 29.70 kg/m   General:  alert, cooperative and no distress  Abdomen soft, incision healing well.  Staples removed, Steri-Strips applied.     Assessment:    Doing well postoperatively.    Plan:   Increase activity as able.  Follow-up here as needed.

## 2020-11-21 ENCOUNTER — Encounter (HOSPITAL_COMMUNITY): Payer: Self-pay

## 2020-11-21 ENCOUNTER — Other Ambulatory Visit: Payer: Self-pay

## 2020-11-21 ENCOUNTER — Emergency Department (HOSPITAL_COMMUNITY)
Admission: EM | Admit: 2020-11-21 | Discharge: 2020-11-21 | Disposition: A | Discharge status: Home / Self Care | Payer: Medicaid - Out of State | Attending: Emergency Medicine | Admitting: Emergency Medicine

## 2020-11-21 DIAGNOSIS — I129 Hypertensive chronic kidney disease with stage 1 through stage 4 chronic kidney disease, or unspecified chronic kidney disease: Secondary | ICD-10-CM | POA: Insufficient documentation

## 2020-11-21 DIAGNOSIS — Z7982 Long term (current) use of aspirin: Secondary | ICD-10-CM | POA: Insufficient documentation

## 2020-11-21 DIAGNOSIS — E876 Hypokalemia: Secondary | ICD-10-CM | POA: Insufficient documentation

## 2020-11-21 DIAGNOSIS — N183 Chronic kidney disease, stage 3 unspecified: Secondary | ICD-10-CM | POA: Insufficient documentation

## 2020-11-21 DIAGNOSIS — Z79899 Other long term (current) drug therapy: Secondary | ICD-10-CM | POA: Insufficient documentation

## 2020-11-21 DIAGNOSIS — D649 Anemia, unspecified: Secondary | ICD-10-CM

## 2020-11-21 DIAGNOSIS — Z8616 Personal history of COVID-19: Secondary | ICD-10-CM | POA: Insufficient documentation

## 2020-11-21 DIAGNOSIS — R778 Other specified abnormalities of plasma proteins: Secondary | ICD-10-CM

## 2020-11-21 DIAGNOSIS — I10 Essential (primary) hypertension: Secondary | ICD-10-CM

## 2020-11-21 DIAGNOSIS — J45909 Unspecified asthma, uncomplicated: Secondary | ICD-10-CM | POA: Insufficient documentation

## 2020-11-21 DIAGNOSIS — H81399 Other peripheral vertigo, unspecified ear: Secondary | ICD-10-CM

## 2020-11-21 DIAGNOSIS — Z9114 Patient's other noncompliance with medication regimen: Secondary | ICD-10-CM | POA: Insufficient documentation

## 2020-11-21 DIAGNOSIS — N289 Disorder of kidney and ureter, unspecified: Secondary | ICD-10-CM

## 2020-11-21 DIAGNOSIS — R42 Dizziness and giddiness: Secondary | ICD-10-CM | POA: Insufficient documentation

## 2020-11-21 LAB — BASIC METABOLIC PANEL
Anion gap: 7 (ref 5–15)
BUN: 20 mg/dL (ref 6–20)
CO2: 29 mmol/L (ref 22–32)
Calcium: 8.9 mg/dL (ref 8.9–10.3)
Chloride: 105 mmol/L (ref 98–111)
Creatinine, Ser: 1.92 mg/dL — ABNORMAL HIGH (ref 0.61–1.24)
GFR, Estimated: 40 mL/min — ABNORMAL LOW (ref >60)
Glucose, Bld: 105 mg/dL — ABNORMAL HIGH (ref 70–99)
Potassium: 2.9 mmol/L — ABNORMAL LOW (ref 3.5–5.1)
Sodium: 141 mmol/L (ref 135–145)

## 2020-11-21 LAB — TROPONIN I (HIGH SENSITIVITY)
Troponin I (High Sensitivity): 19 ng/L — ABNORMAL HIGH (ref <18)
Troponin I (High Sensitivity): 19 ng/L — ABNORMAL HIGH (ref <18)

## 2020-11-21 LAB — CBC WITH DIFFERENTIAL/PLATELET
Abs Immature Granulocytes: 0.02 10*3/uL (ref 0.00–0.07)
Basophils Absolute: 0.1 10*3/uL (ref 0.0–0.1)
Basophils Relative: 1 %
Eosinophils Absolute: 0.5 10*3/uL (ref 0.0–0.5)
Eosinophils Relative: 5 %
HCT: 36.1 % — ABNORMAL LOW (ref 39.0–52.0)
Hemoglobin: 11.9 g/dL — ABNORMAL LOW (ref 13.0–17.0)
Immature Granulocytes: 0 %
Lymphocytes Relative: 23 %
Lymphs Abs: 2.3 10*3/uL (ref 0.7–4.0)
MCH: 31.9 pg (ref 26.0–34.0)
MCHC: 33 g/dL (ref 30.0–36.0)
MCV: 96.8 fL (ref 80.0–100.0)
Monocytes Absolute: 0.7 10*3/uL (ref 0.1–1.0)
Monocytes Relative: 7 %
Neutro Abs: 6.2 10*3/uL (ref 1.7–7.7)
Neutrophils Relative %: 64 %
Platelets: 341 10*3/uL (ref 150–400)
RBC: 3.73 MIL/uL — ABNORMAL LOW (ref 4.22–5.81)
RDW: 13.5 % (ref 11.5–15.5)
WBC: 9.9 10*3/uL (ref 4.0–10.5)
nRBC: 0 % (ref 0.0–0.2)

## 2020-11-21 MED ORDER — METOPROLOL TARTRATE 100 MG PO TABS
100.0000 mg | ORAL_TABLET | Freq: Two times a day (BID) | ORAL | 0 refills | Status: AC
Start: 2020-11-21 — End: ?

## 2020-11-21 MED ORDER — HYDRALAZINE HCL 100 MG PO TABS: 100.0000 mg | ORAL_TABLET | Freq: Three times a day (TID) | ORAL | 0 refills | Status: AC

## 2020-11-21 MED ORDER — MECLIZINE HCL 25 MG PO TABS
25.0000 mg | ORAL_TABLET | Freq: Three times a day (TID) | ORAL | 0 refills | Status: AC | PRN
Start: 2020-11-21 — End: ?

## 2020-11-21 MED ORDER — AMLODIPINE BESYLATE 10 MG PO TABS
10.0000 mg | ORAL_TABLET | Freq: Every day | ORAL | 0 refills | Status: AC
Start: 2020-11-21 — End: ?

## 2020-11-21 MED ORDER — POTASSIUM CHLORIDE CRYS ER 20 MEQ PO TBCR
40.0000 meq | EXTENDED_RELEASE_TABLET | Freq: Once | ORAL | Status: AC
  Administered 2020-11-21: 40 meq via ORAL
  Filled 2020-11-21: qty 2

## 2020-11-21 MED ORDER — AMLODIPINE BESYLATE 5 MG PO TABS
10.0000 mg | ORAL_TABLET | Freq: Once | ORAL | Status: AC
  Administered 2020-11-21: 10 mg via ORAL
  Filled 2020-11-21: qty 2

## 2020-11-21 MED ORDER — METOPROLOL TARTRATE 50 MG PO TABS
100.0000 mg | ORAL_TABLET | Freq: Once | ORAL | Status: AC
  Administered 2020-11-21: 100 mg via ORAL
  Filled 2020-11-21: qty 2

## 2020-11-21 MED ORDER — POTASSIUM CHLORIDE CRYS ER 20 MEQ PO TBCR
20.0000 meq | EXTENDED_RELEASE_TABLET | Freq: Two times a day (BID) | ORAL | 0 refills | Status: AC
Start: 2020-11-21 — End: ?

## 2020-11-21 MED ORDER — HYDRALAZINE HCL 25 MG PO TABS
100.0000 mg | ORAL_TABLET | Freq: Once | ORAL | Status: AC
  Administered 2020-11-21: 100 mg via ORAL
  Filled 2020-11-21: qty 4

## 2020-11-21 MED ORDER — MECLIZINE HCL 12.5 MG PO TABS
25.0000 mg | ORAL_TABLET | Freq: Once | ORAL | Status: AC
  Administered 2020-11-21: 25 mg via ORAL
  Filled 2020-11-21: qty 2

## 2020-11-21 NOTE — Discharge Instructions (Signed)
Please check your blood pressure once a day.  Keep a record of it and take that record with you when you see your primary care provider.  Return if your symptoms are getting worse.

## 2020-11-21 NOTE — ED Triage Notes (Addendum)
Pt reports dizziness and headache x 2 days. Pt says when he opens his eyes the whole room spins. Pt says he hasn't taken his BP meds in 3 weeks.

## 2020-11-21 NOTE — ED Notes (Signed)
ED Provider at bedside. 

## 2020-11-21 NOTE — ED Provider Notes (Signed)
[facility] EMERGENCY DEPARTMENT Provider Note   CSN: [id number] Arrival date & time: 11/21/20  0158     History Chief complaint: Dizziness  Ethan Schmidt is a 60 y.o. male.  The history is provided by the patient.  He has history of hypertension, chronic kidney disease and comes in complaining of dizziness whenever he opens his eyes.  This started about 6 hours ago.  Dizziness is described as things spinning around and is associated with nausea but no vomiting.  Is also complaining of a frontal headache and some vague chest pain.  He states that he had left his blood pressure medication in another city and has not taken it in about the last week.  He denies any dyspnea and denies arthralgias or myalgias.   Past Medical History:  Diagnosis Date  . Arthritis   . Asthma   . Chronic kidney disease   . Complication of anesthesia    unknown  . Hypertension     Patient Active Problem List   Diagnosis Date Noted  . SBO (small bowel obstruction) ([place])   . Abdominal pain   . Intestinal obstruction ([place]) 12/21/2019  . History of COVID-19 12/21/2019  . Hypertensive urgency 12/21/2019  . Hypokalemia 12/21/2019  . Asthma   . Hypertension   . Vitamin D insufficiency 11/15/2019  . Tobacco abuse 06/29/2014  . Asthma exacerbation 06/29/2014  . Acute renal failure superimposed on stage 3 chronic kidney disease ([place]) 06/29/2014    Past Surgical History:  Procedure Laterality Date  . APPENDECTOMY    . LAPAROTOMY N/A 12/27/2019   Procedure: EXPLORATORY LAPAROTOMY WITH LYSIS OF ADHESIONS;  Surgeon: Aviva Signs, MD;  Location: [facility];  Service: General;  Laterality: N/A;       Family History  Problem Relation Age of Onset  . Heart attack Mother   . Hypertension Mother   . Heart attack Father   . Hypertension Sister   . Diabetes Sister   . Arthritis Sister   . Hypertension Brother   . Kidney disease Brother   . Diabetes Brother   . Heart disease Brother   . Arthritis Brother    . Hypertension Brother   . Cancer Brother        colon cancer  . Arthritis Brother   . Hypertension Brother   . Arthritis Brother   . Hypertension Brother   . Arthritis Brother   . Hypertension Sister   . Diabetes Sister   . Arthritis Sister   . Hypertension Sister   . Diabetes Sister   . Arthritis Sister   . Hypertension Sister   . Diabetes Sister   . Arthritis Sister   . Hypertension Sister   . Diabetes Sister   . Arthritis Sister   . Hypertension Sister   . Diabetes Sister   . Arthritis Sister   . Hypertension Sister   . Arthritis Sister   . Hypertension Sister   . Heart attack Sister   . Arthritis Sister     Social History   Tobacco Use  . Smoking status: Never Smoker  . Smokeless tobacco: Never Used  Vaping Use  . Vaping Use: Never used  Substance Use Topics  . Alcohol use: Yes    Comment: on holidays and special occ.  . Drug use: Yes    Types: Marijuana, Cocaine    Comment: last used one weeks ago (11/21/20)    Home Medications Prior to Admission medications   Medication Sig Start Date End Date Taking? Authorizing  Provider  albuterol (VENTOLIN HFA) 108 (90 Base) MCG/ACT inhaler Inhale 1-2 puffs into the lungs every 4 (four) hours as needed for wheezing or shortness of breath. 11/15/19   Johnson, Clanford L, MD  amLODipine (NORVASC) 10 MG tablet Take 1 tablet (10 mg total) by mouth daily. 12/31/19   Johnson, Clanford L, MD  ascorbic acid (VITAMIN C) 500 MG tablet Take 1 tablet (500 mg total) by mouth daily. 11/16/19   Johnson, Clanford L, MD  aspirin 81 MG tablet Take 81 mg by mouth daily.    [provider]  Cholecalciferol (VITAMIN D) 125 MCG (5000 UT) CAPS Take 1 capsule by mouth daily. 11/15/19   Johnson, Clanford L, MD  hydrALAZINE (APRESOLINE) 100 MG tablet Take 1 tablet (100 mg total) by mouth 3 (three) times daily. 12/31/19   Johnson, Clanford L, MD  loperamide (IMODIUM) 2 MG capsule Take 1 capsule (2 mg total) by mouth as needed for diarrhea or  loose stools. 11/10/19   Manuella Ghazi, Pratik D, DO  metoprolol tartrate (LOPRESSOR) 100 MG tablet Take 1 tablet (100 mg total) by mouth 2 (two) times daily. 12/31/19   Johnson, Clanford L, MD  pantoprazole (PROTONIX) 40 MG tablet Take 1 tablet (40 mg total) by mouth daily. 11/15/19 11/14/20  Murlean Iba, MD    Allergies    Shellfish allergy and Omnipaque [iohexol]  Review of Systems   Review of Systems  All other systems reviewed and are negative.   Physical Exam Updated Vital Signs BP (!) 198/99   Pulse 60   Temp 98.5 F (36.9 C) (Oral)   Resp 20   Ht 6' (1.829 m)   Wt 99.3 kg   SpO2 100%   BMI 29.69 kg/m   Physical Exam Vitals and nursing note reviewed.   60 year old male, resting comfortably and in no acute distress. Vital signs are significant for elevated blood pressure. Oxygen saturation is 100%, which is normal. Head is normocephalic and atraumatic. PERRLA, EOMI. Oropharynx is clear.  No nystagmus is noted. Neck is nontender and supple without adenopathy or JVD. Back is nontender and there is no CVA tenderness. Lungs are clear without rales, wheezes, or rhonchi. Chest is nontender. Heart has regular rate and rhythm without murmur. Abdomen is soft, flat, nontender without masses or hepatosplenomegaly and peristalsis is normoactive. Extremities have no cyanosis or edema, full range of motion is present. Skin is warm and dry without rash. Neurologic: Mental status is normal, cranial nerves are intact, there are no motor or sensory deficits.  Dizziness is reproduced by passive head movement.  ED Results / Procedures / Treatments   Labs (all labs ordered are listed, but only abnormal results are displayed) Labs Reviewed - No data to display  EKG EKG Interpretation  Date/Time:  Saturday Nov 21 2020 02:25:33 EDT Ventricular Rate:  59 PR Interval:  174 QRS Duration: 88 QT Interval:  433 QTC Calculation: 429 R Axis:   67 Text Interpretation: Sinus rhythm Probable LVH  with secondary repol abnrm When compared with ECG of 12/21/2019, QT has shortened Confirmed by Delora Fuel ([id number]) on 11/21/2020 2:30:20 AM   Radiology No results found.  Procedures Procedures   Medications Ordered in ED Medications - No data to display  ED Course  I have reviewed the triage vital signs and the nursing notes.  Pertinent labs & imaging results that were available during my care of the patient were reviewed by me and considered in my medical decision making (see chart for  details).   MDM Rules/Calculators/A&P                         Dizziness which based on physical exam, seems to be peripheral vertigo.  No red flags to suggest central vertigo.  Poorly controlled blood pressure.  Vague headache and chest pain.  ECG shows no acute changes.  He will be given doses of his blood pressure medications as well as meclizine and will check screening labs.  Old records are reviewed, and he has prior ED visits for elevated blood pressure and chest pain.  Labs show renal insufficiency which is stable, mild anemia which is also stable.  Moderately severe hypokalemia is noted and he is given dose of oral potassium.  Following meclizine, vertigo has improved.  Troponin is mildly elevated, but repeat troponin is unchanged.  This is likely related to his renal insufficiency.  He is discharged with prescriptions for his antihypertensive medications of amlodipine, metoprolol, hydralazine and also given prescriptions for meclizine and K-Dur.  Recommended follow-up with PCP in 1 week.  Return precautions discussed.  Final Clinical Impression(s) / ED Diagnoses Final diagnoses:  Peripheral vertigo, unspecified laterality  Elevated blood pressure reading with diagnosis of hypertension  Noncompliance with medication regimen  Hypokalemia  Renal insufficiency  Elevated troponin  Normochromic normocytic anemia    Rx / DC Orders ED Discharge Orders         Ordered    amLODipine (NORVASC) 10 MG  tablet  Daily        11/21/20 0713    hydrALAZINE (APRESOLINE) 100 MG tablet  3 times daily        11/21/20 0713    metoprolol tartrate (LOPRESSOR) 100 MG tablet  2 times daily        11/21/20 0713    meclizine (ANTIVERT) 25 MG tablet  3 times daily PRN        11/21/20 0713    potassium chloride SA (KLOR-CON) 20 MEQ tablet  2 times daily        11/21/20 [id number]           Delora Fuel, MD [id number]

## 2021-10-02 IMAGING — CT CT ABD-PELV W/ CM
2 of 5 series · 15 of 46 positions shown, 17 images · IV contrast (omnipaque)
Comparison: Radiograph 12/25/2019. Chest and abdominal CTA
12/21/2019.

CLINICAL DATA: Bowel obstruction suspected.  Abdominal pain.

EXAM:
CT ABDOMEN AND PELVIS WITH CONTRAST
TECHNIQUE: Multidetector CT imaging of the abdomen and pelvis was performed
using the standard protocol following bolus administration of
intravenous contrast.
CONTRAST:  80mL OMNIPAQUE IOHEXOL 300 MG/ML  SOLN

[Series 3: axial st · axial · 0.81mm/px · z∈[+1062,+1582]mm · 12 of 118 slices shown, 14 images]
[im 7/118  soft-tissue]
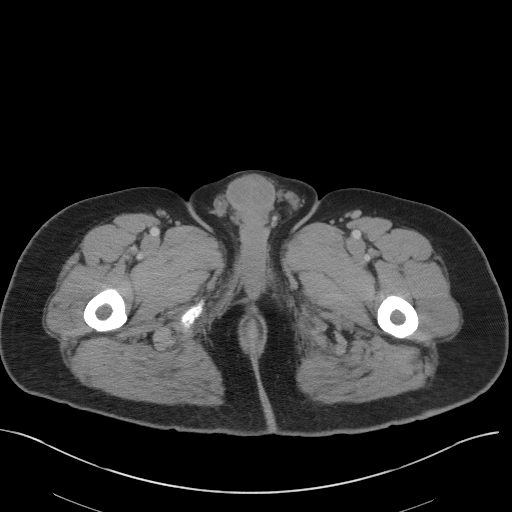
[im 7/118  bone]
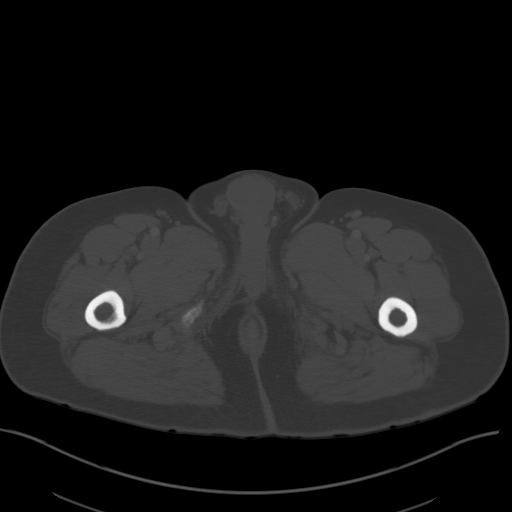
[im 20/118  soft-tissue]
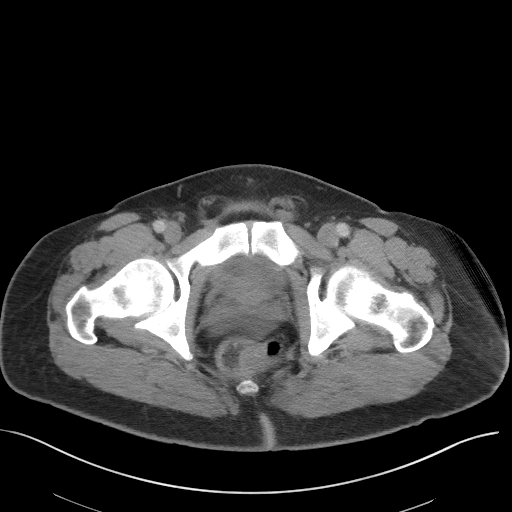
[im 27/118  soft-tissue]
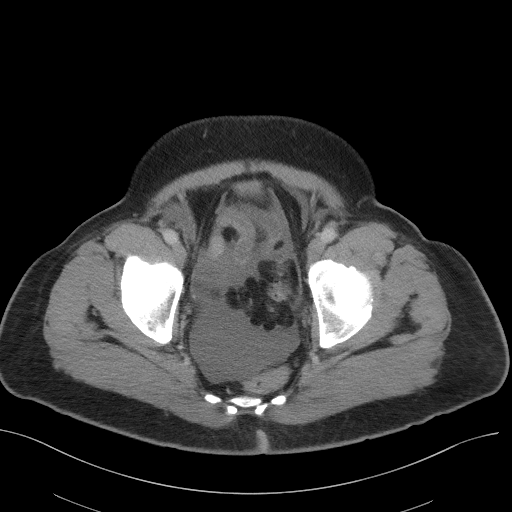
[im 33/118  soft-tissue]
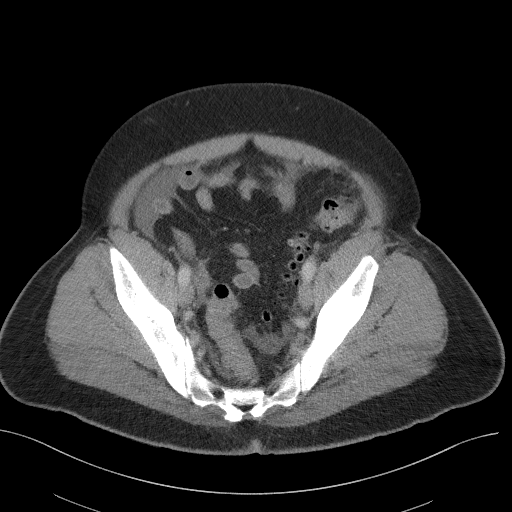
[im 46/118  soft-tissue]
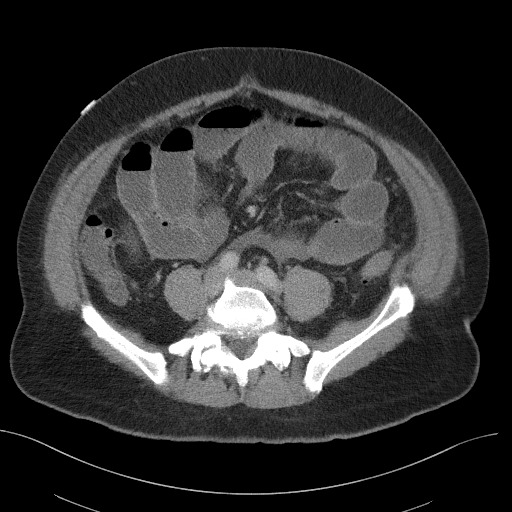
[im 53/118  soft-tissue]
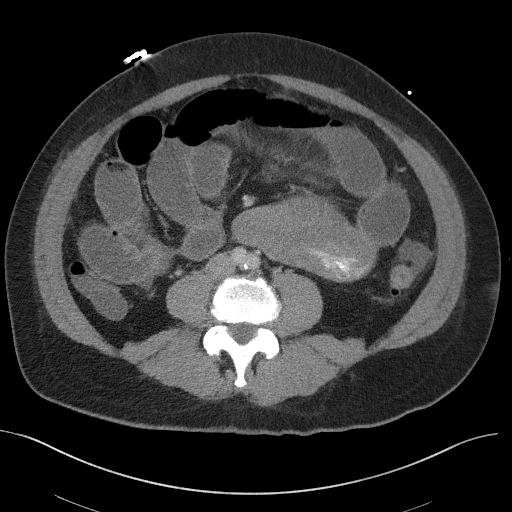
[im 66/118  soft-tissue]
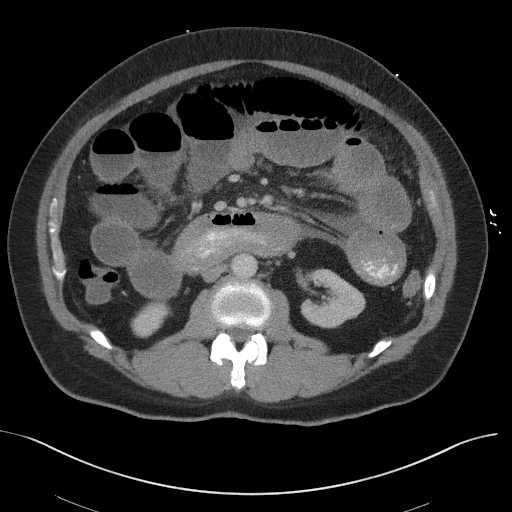
[im 72/118  soft-tissue]
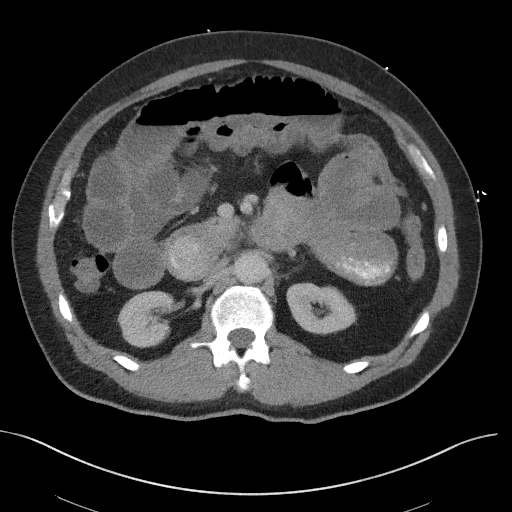
[im 85/118  soft-tissue]
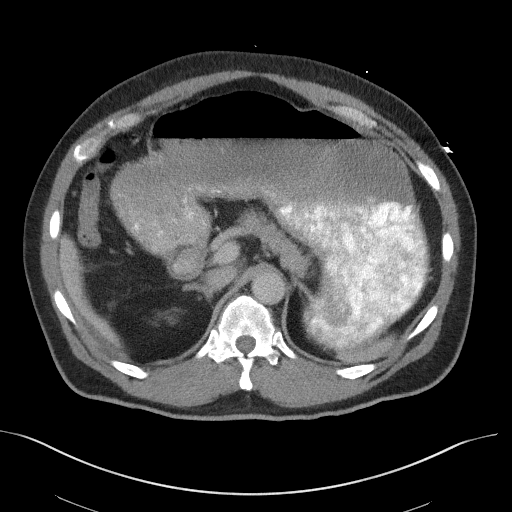
[im 85/118  bone]
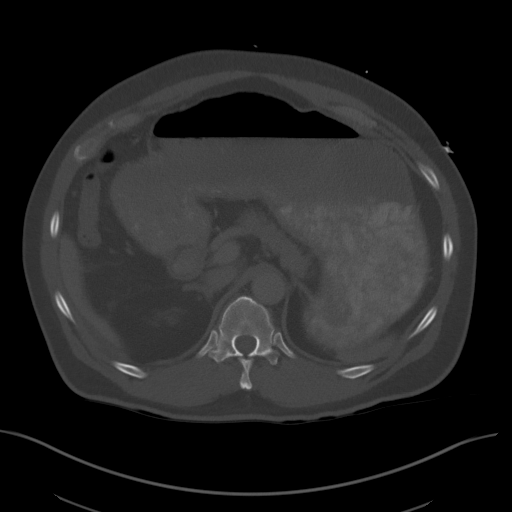
[im 92/118  soft-tissue]
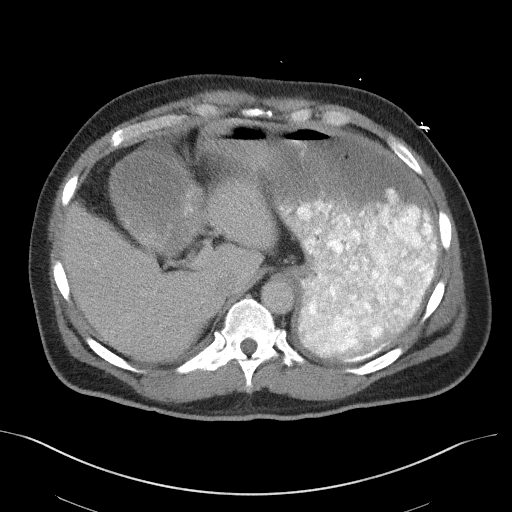
[im 98/118  soft-tissue]
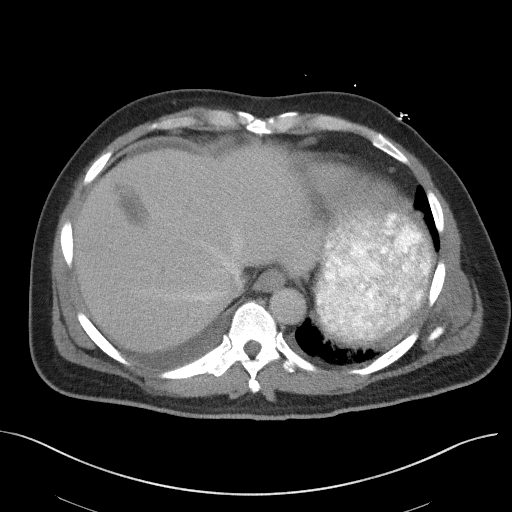
[im 111/118  soft-tissue]
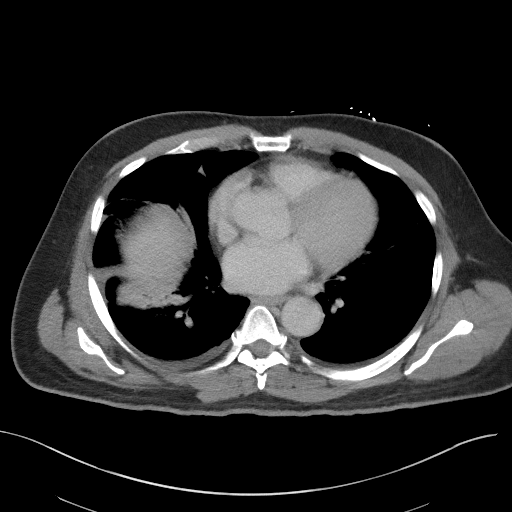

[Series 6: coronal st · coronal · 0.90mm/px · 3 of 118 slices shown]
[im 40/118  soft-tissue]
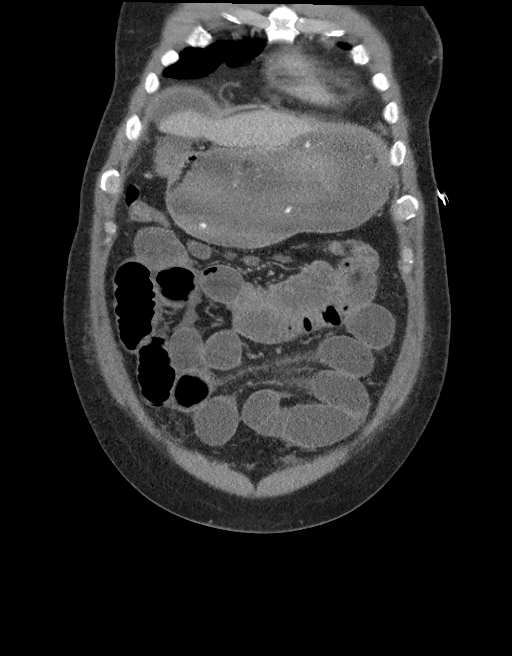
[im 53/118  soft-tissue]
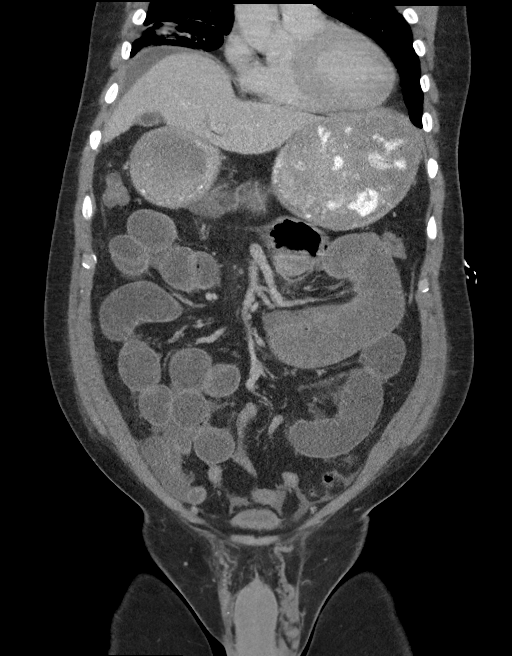
[im 66/118  soft-tissue]
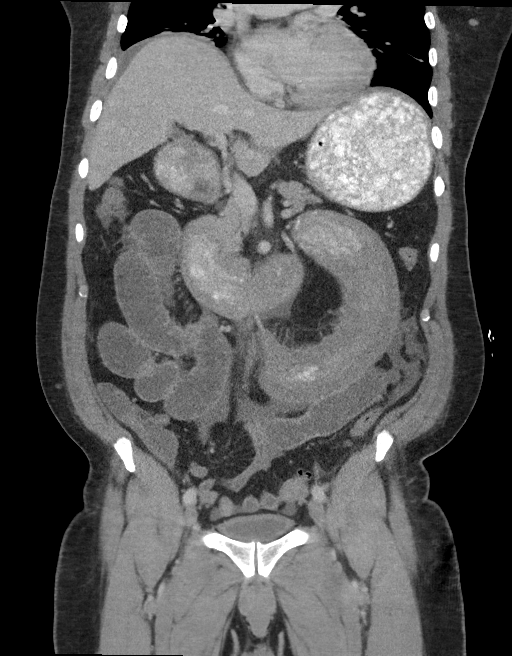

[15 of 46 positions shown; findings below may reference images not displayed]

FINDINGS: Lower chest: Similar streaky bilateral lung opacities. Small right
and trace left pleural effusion. Compressive atelectasis in the
right middle and lower lobe.

Hepatobiliary: No focal hepatic abnormality. Gallbladder minimally
distended. There is no biliary dilatation.

Pancreas: No ductal dilatation or inflammation.

Spleen: Normal in size without focal abnormality.

Adrenals/Urinary Tract: No adrenal nodule. No hydronephrosis or
perinephric edema. Homogeneous renal enhancement with symmetric
excretion on delayed phase imaging. Urinary bladder is partially
distended without wall thickening.

Stomach/Bowel: Marked gastric distention with fluid and enteric
contrast. Diffusely dilated and fluid-filled small bowel. Transition
point in the central abdomen, series 3, image 74. there is central
mesenteric edema that has slightly progressed from prior. More
distal small bowel are decompressed. No pneumatosis. Minimal liquid
and solid stool in the colon. No abnormal colonic distention.
Descending and sigmoid diverticulosis without diverticulitis.

Vascular/Lymphatic: Mild aortic atherosclerosis. No aortic aneurysm.
The portal vein is patent. No evidence of mesenteric or portal
venous gas. No adenopathy.

Reproductive: Prostate is unremarkable.

Other: Small volume ascites in the upper abdomen and pelvis.
Scattered mesenteric ascites. Mesenteric edema. There is no free
air. Tiny fat containing umbilical hernia.

Musculoskeletal: Degenerative change in the spine, unchanged from
prior. There are no acute or suspicious osseous abnormalities.
IMPRESSION: 1. Small bowel obstruction with transition point in the central
abdomen, likely due to adhesions. There is progression and
mesenteric edema and bowel distension from obstruction on CT 6 days
ago.
2. Small volume ascites in the upper abdomen and pelvis.
3. Colonic diverticulosis without diverticulitis.
4. Small right and trace left pleural effusions are new.

Aortic Atherosclerosis ([id number]-[id number]).

## 2022-01-01 DEATH — deceased
